# Patient Record
Sex: Female | Born: 1950
Health system: Southern US, Community
[De-identification: ages and names within clinical notes are randomized; demographics above are authoritative.]

## PROBLEM LIST (undated history)

## (undated) DIAGNOSIS — J449 Chronic obstructive pulmonary disease, unspecified: Secondary | ICD-10-CM

## (undated) DIAGNOSIS — T7840XA Allergy, unspecified, initial encounter: Secondary | ICD-10-CM

## (undated) DIAGNOSIS — F419 Anxiety disorder, unspecified: Secondary | ICD-10-CM

## (undated) DIAGNOSIS — E559 Vitamin D deficiency, unspecified: Secondary | ICD-10-CM

## (undated) DIAGNOSIS — M1990 Unspecified osteoarthritis, unspecified site: Secondary | ICD-10-CM

## (undated) DIAGNOSIS — I10 Essential (primary) hypertension: Secondary | ICD-10-CM

## (undated) DIAGNOSIS — E785 Hyperlipidemia, unspecified: Secondary | ICD-10-CM

## (undated) DIAGNOSIS — K219 Gastro-esophageal reflux disease without esophagitis: Secondary | ICD-10-CM

## (undated) DIAGNOSIS — E119 Type 2 diabetes mellitus without complications: Secondary | ICD-10-CM

## (undated) HISTORY — DX: Allergy, unspecified, initial encounter: T78.40XA

## (undated) HISTORY — DX: Vitamin D deficiency, unspecified: E55.9

## (undated) HISTORY — PX: ABDOMINAL HYSTERECTOMY: SHX81

## (undated) HISTORY — DX: Gastro-esophageal reflux disease without esophagitis: K21.9

## (undated) HISTORY — PX: FRACTURE SURGERY: SHX138

## (undated) HISTORY — DX: Unspecified osteoarthritis, unspecified site: M19.90

## (undated) HISTORY — DX: Type 2 diabetes mellitus without complications: E11.9

## (undated) HISTORY — DX: Hereditary hemochromatosis: E83.110

## (undated) HISTORY — PX: COLONOSCOPY: SHX174

## (undated) HISTORY — DX: Anxiety disorder, unspecified: F41.9

## (undated) HISTORY — DX: Hyperlipidemia, unspecified: E78.5

## (undated) HISTORY — PX: LAPAROSCOPIC HYSTERECTOMY: SHX1926

## (undated) HISTORY — DX: Chronic obstructive pulmonary disease, unspecified: J44.9

---

## 1983-10-20 HISTORY — PX: FRACTURE SURGERY: SHX138

## 2006-01-08 ENCOUNTER — Ambulatory Visit: Payer: Self-pay

## 2010-06-12 ENCOUNTER — Encounter: Admission: RE | Admit: 2010-06-12 | Discharge: 2010-06-12 | Payer: Self-pay | Admitting: Family Medicine

## 2010-10-19 HISTORY — PX: GANGLION CYST EXCISION: SHX1691

## 2011-06-04 ENCOUNTER — Emergency Department (HOSPITAL_COMMUNITY)
Admission: EM | Admit: 2011-06-04 | Discharge: 2011-06-04 | Disposition: A | Discharge status: Home / Self Care | Payer: BC Managed Care – PPO | Attending: Emergency Medicine | Admitting: Emergency Medicine

## 2011-06-04 DIAGNOSIS — I10 Essential (primary) hypertension: Secondary | ICD-10-CM | POA: Insufficient documentation

## 2011-06-04 DIAGNOSIS — L02419 Cutaneous abscess of limb, unspecified: Secondary | ICD-10-CM | POA: Insufficient documentation

## 2011-06-04 DIAGNOSIS — L03119 Cellulitis of unspecified part of limb: Secondary | ICD-10-CM | POA: Insufficient documentation

## 2011-06-04 DIAGNOSIS — M25579 Pain in unspecified ankle and joints of unspecified foot: Secondary | ICD-10-CM | POA: Insufficient documentation

## 2011-06-04 LAB — URIC ACID: Uric Acid, Serum: 3.7 mg/dL (ref 2.4–7.0)

## 2011-06-04 LAB — POCT I-STAT, CHEM 8
Chloride: 102 mEq/L (ref 96–112)
Glucose, Bld: 125 mg/dL — ABNORMAL HIGH (ref 70–99)
Hemoglobin: 14.6 g/dL (ref 12.0–15.0)
Potassium: 4.1 mEq/L (ref 3.5–5.1)
Sodium: 138 mEq/L (ref 135–145)
TCO2: 27 mmol/L (ref 0–100)

## 2011-06-04 LAB — DIFFERENTIAL
Basophils Absolute: 0 10*3/uL (ref 0.0–0.1)
Eosinophils Absolute: 0 10*3/uL (ref 0.0–0.7)
Lymphocytes Relative: 7 % — ABNORMAL LOW (ref 12–46)
Neutro Abs: 9.6 10*3/uL — ABNORMAL HIGH (ref 1.7–7.7)
Neutrophils Relative %: 86 % — ABNORMAL HIGH (ref 43–77)

## 2011-06-04 LAB — CBC
HCT: 41.1 % (ref 36.0–46.0)
MCH: 34.2 pg — ABNORMAL HIGH (ref 26.0–34.0)
MCHC: 34.8 g/dL (ref 30.0–36.0)
MCV: 98.3 fL (ref 78.0–100.0)
RBC: 4.18 MIL/uL (ref 3.87–5.11)
RDW: 12.4 % (ref 11.5–15.5)
WBC: 11.2 10*3/uL — ABNORMAL HIGH (ref 4.0–10.5)

## 2011-06-04 LAB — SEDIMENTATION RATE: Sed Rate: 49 mm/hr — ABNORMAL HIGH (ref 0–22)

## 2011-06-06 ENCOUNTER — Inpatient Hospital Stay (HOSPITAL_COMMUNITY): Payer: BC Managed Care – PPO

## 2011-06-06 ENCOUNTER — Inpatient Hospital Stay (HOSPITAL_COMMUNITY)
Admission: AD | Admit: 2011-06-06 | Discharge: 2011-06-08 | DRG: 218 | Disposition: A | Discharge status: Home / Self Care | Payer: BC Managed Care – PPO | Source: Ambulatory Visit | Attending: Orthopedic Surgery | Admitting: Orthopedic Surgery

## 2011-06-06 DIAGNOSIS — M659 Unspecified synovitis and tenosynovitis, unspecified site: Secondary | ICD-10-CM | POA: Diagnosis present

## 2011-06-06 DIAGNOSIS — F1010 Alcohol abuse, uncomplicated: Secondary | ICD-10-CM | POA: Diagnosis present

## 2011-06-06 DIAGNOSIS — F17200 Nicotine dependence, unspecified, uncomplicated: Secondary | ICD-10-CM | POA: Diagnosis present

## 2011-06-06 DIAGNOSIS — M009 Pyogenic arthritis, unspecified: Principal | ICD-10-CM | POA: Diagnosis present

## 2011-06-06 DIAGNOSIS — L02419 Cutaneous abscess of limb, unspecified: Secondary | ICD-10-CM | POA: Diagnosis present

## 2011-06-06 DIAGNOSIS — I10 Essential (primary) hypertension: Secondary | ICD-10-CM | POA: Diagnosis present

## 2011-06-06 LAB — CBC
RDW: 12.4 % (ref 11.5–15.5)
WBC: 6.6 10*3/uL (ref 4.0–10.5)

## 2011-06-06 LAB — SEDIMENTATION RATE: Sed Rate: 101 mm/hr — ABNORMAL HIGH (ref 0–22)

## 2011-06-06 LAB — TYPE AND SCREEN
ABO/RH(D): O POS
Antibody Screen: NEGATIVE

## 2011-06-06 LAB — BASIC METABOLIC PANEL
CO2: 29 mEq/L (ref 19–32)
Calcium: 10.1 mg/dL (ref 8.4–10.5)
Glucose, Bld: 113 mg/dL — ABNORMAL HIGH (ref 70–99)

## 2011-06-06 LAB — GRAM STAIN: Gram Stain: NONE SEEN

## 2011-06-06 LAB — DIFFERENTIAL
Basophils Absolute: 0 10*3/uL (ref 0.0–0.1)
Eosinophils Absolute: 0.1 10*3/uL (ref 0.0–0.7)
Eosinophils Relative: 1 % (ref 0–5)
Lymphs Abs: 0.9 10*3/uL (ref 0.7–4.0)
Monocytes Absolute: 0.6 10*3/uL (ref 0.1–1.0)
Neutrophils Relative %: 75 % (ref 43–77)

## 2011-06-07 LAB — BASIC METABOLIC PANEL
CO2: 28 mEq/L (ref 19–32)
Calcium: 9.3 mg/dL (ref 8.4–10.5)
Chloride: 102 mEq/L (ref 96–112)
Creatinine, Ser: <0.47 mg/dL — ABNORMAL LOW (ref 0.50–1.10)

## 2011-06-10 LAB — WOUND CULTURE

## 2011-06-12 LAB — ANAEROBIC CULTURE

## 2011-06-30 NOTE — Discharge Summary (Signed)
  [name], [name]                  ACCOUNT NO.:  [id number]  MEDICAL RECORD NO.:  [id number]  LOCATION:  1610                         FACILITY:  [facility]  PHYSICIAN:  Almedia Balls. Ranell Patrick, M.D. DATE OF BIRTH:  1951-04-13  DATE OF ADMISSION:  06/06/2011 DATE OF DISCHARGE:  06/08/2011                              DISCHARGE SUMMARY   ADMISSION DIAGNOSIS:  Left ankle infection.  DISCHARGE DIAGNOSIS:  Left ankle infection.  BRIEF HISTORY:  The patient is a 60 year old female who has been diagnosed with a recent cellulitis versus septic ankle to the left side. The patient was admitted for surgical management due to decreasing effectiveness of oral antibiotics with the left ankle pain and swelling  BRIEF HISTORY:  The patient is a 60 year old female who had an aspiration of a cyst of the left ankle about 2 weeks prior to follow up in the office on August 16.  The patient returned back to the office with a swollen red foot, it is quite tender.  The patient was tried on oral antibiotics, which were ineffective.  We did get a stat MRI scan, which was basically inconclusive, thus the patient was admitted for surgical I and D of the left ankle to try and rule out infection and improve outcome.  PROCEDURE:  The patient had a left ankle I and D and placement of a drain by Dr. Malon Kindle on June 06, 2011.  ASSISTANT:  No assistant.  ANESTHESIA:  General anesthesia was used.  COMPLICATIONS:  No complications.  HOSPITAL COURSE:  The patient was admitted on June 06, 2011, for the above-stated procedure, which she tolerated well.  After adequate time in Post Anesthesia Care Unit, she was transferred to 6 East.  Postop day #1, the patient complained of minimal pain in the left ankle.  She was in a splint.  Drain was in place.  She did not have that drain removed until postop day #2.  She had a CAM Walker placed.  Neurovascularly, she was intact.  She had decreased erythema.  She still had  some moderate tenderness.  Neurovascularly, she was intact distally.  Capillary refill was 2 seconds.  She was basically toe-touch weightbearing only to the left lower extremity.  The patient was kept in the hospital for IV antibiotics and once she was deemed appropriate to be discharged to home, she was on oral antibiotics.  The patient's labs within acceptable limits.  No signs of white count or leukocytosis throughout her entire stay.  She was afebrile.  Overall, did quite well.  DISCHARGE/PLAN:  The patient be discharged home on June 10, 2011.  Her condition was stable.  Her diet is regular.  DISCHARGE MEDICATIONS: 1. Robaxin 500 mg p.o. q.6 hours. 2. Percocet 5/325 1-10 tabs q.4-6 hours p.r.n. pain. 3. Clindamycin 600 mg b.i.d. for 10 days.  PLAN:  Follow up with Dr. Malon Kindle in 2 weeks.     Thomas B. Dixon, P.A.   ______________________________ Almedia Balls. Ranell Patrick, M.D.    TBD/MEDQ  D:  06/23/2011  T:  06/23/2011  Job:  [id number]  Electronically Signed by Malon Kindle  on 06/30/2011 03:08:48 AM

## 2011-06-30 NOTE — Op Note (Signed)
[name], [name]                  ACCOUNT NO.:  [id number]  MEDICAL RECORD NO.:  [id number]  LOCATION:  1610                         FACILITY:  [facility]  PHYSICIAN:  Almedia Balls. Ranell Patrick, M.D. DATE OF BIRTH:  10-26-1950  DATE OF PROCEDURE:  06/06/2011 DATE OF DISCHARGE:                              OPERATIVE REPORT   PREOPERATIVE DIAGNOSIS:  Left ankle infection, superficial and deep.  POSTOPERATIVE DIAGNOSIS:  Left ankle infection, superficial and deep.  PROCEDURE PERFORMED:  Left ankle open and arthroscopic I and D with placement of drain in the joint.  SURGEON:  Almedia Balls. Ranell Patrick, M.D.  ASSISTANT:  None.  ANESTHESIA:  General anesthesia was used.  ESTIMATED BLOOD LOSS:  Less than 50 mL.  FLUID REPLACEMENT:  1000 mL crystalloid.  INSTRUMENT COUNT:  Correct.  COMPLICATIONS:  There were no complications.  Preoperative antibiotics were given after cultures were sent.  INDICATIONS:  The patient is a 60 year old female with worsening left ankle pain and swelling despite conservative management.  The patient has had an aspiration of a large amount of fluid from the lateral ankle done earlier this week.  She was seen in the emergency department.  She was treated for either infection or presumptive gout.  Despite both treatment for gout and treatment for infection with clindamycin, the patient's ankle swelling and redness were getting worse.  MRI scan demonstrating loculated fluid, both in the subcu as well as deep, around the ankle joint along the peroneal tendons.  After counseling the patient and her husband regarding treatment options, we have recommended admission to the hospital for IV antibiotics and surgical I and D. Informed consent was obtained.  DESCRIPTION OF PROCEDURE:  After adequate level of anesthesia achieved, the patient positioned supine on operating table.  Left leg was correctly identified.  Nonsterile tourniquet was placed in the left proximal thigh.  The  left leg was then sterilely prepped and draped in usual manner.  We initiated surgery with creation of a small anterolateral portal for the ankle arthroscopy.  Through this portal, we encountered some serous fluid in a pocket that corresponded to the subcutaneous fluid that was noted on MRI scan.  We did go and culture that fluid.  There was quite a bit of seroma present in that fluid and some mucinous material was encountered in there, but no significant cloudy fluid was encountered.  We then entered the ankle joint from the anteromedial portal and did not note any cloudy fluid in the ankle joint.  Then we continued our portal on the anterolateral side after we had lavaged that anterolateral fluid collection.  We then ran 6 liters of normal saline irrigation through the ankle joint.  There was fair amount of synovitis present in the ankle which we removed using a suction shaver.  There was also arthritis beginning, mostly in the anterior tibial plafond.  Just right at the articular margin, there seemed to be an osteophyte there and some early wear and tear from the cartilage.  Otherwise the articular cartilage appeared to be in pretty good shape.  There was synovitis in both the medial gutter and the lateral gutter.  We removed that again with  a suction shaver and inspected to make sure we did not see any signs of deep infection.  Once we had lavaged the ankle joint thoroughly, we extended that anterolateral a little bit more so as about 1.5 cm in length and used a hemostat to bluntly dissecting the soft tissue planes around there, breaking up any loculated fluid that was there and then thoroughly irrigating with normal saline.  We then made a separate longitudinal incision over the peroneal tendons entering the perineal sheath and immediately encountering a fair amount of fluid, not that cloudy, but we did drain that and removed a little bit of tenosynovium from around the peroneals  that appeared to be hypertrophic and inflammatory.  We lavaged that really well with saline.  Once all wounds were thoroughly lavaged, the edema and swelling in the ankle was down.  We went ahead and placed a drain in the joint through the anteromedial portal and then sewed that portal closed and sewed the drain in.  We then sewed the anterolateral incision and the posterolateral incision with just a single-layered skin closure with nylon with horizontal mattress.  We then applied a sterile compressive bandage and a short-leg splint.  The patient was awakened and taken to the recovery room in stable condition.     Almedia Balls. Ranell Patrick, M.D.     SRN/MEDQ  D:  06/06/2011  T:  06/07/2011  Job:  [id number]  Electronically Signed by Malon Kindle  on 06/30/2011 03:08:52 AM

## 2012-02-17 ENCOUNTER — Ambulatory Visit: Payer: BC Managed Care – PPO | Admitting: Sports Medicine

## 2012-03-15 ENCOUNTER — Ambulatory Visit (INDEPENDENT_AMBULATORY_CARE_PROVIDER_SITE_OTHER): Payer: BC Managed Care – PPO | Admitting: Sports Medicine

## 2012-03-15 VITALS — BP 130/70 | Ht 61.0 in | Wt 144.0 lb

## 2012-03-15 DIAGNOSIS — M25572 Pain in left ankle and joints of left foot: Secondary | ICD-10-CM

## 2012-03-15 DIAGNOSIS — G8929 Other chronic pain: Secondary | ICD-10-CM

## 2012-03-15 DIAGNOSIS — M25579 Pain in unspecified ankle and joints of unspecified foot: Secondary | ICD-10-CM

## 2012-03-15 NOTE — Assessment & Plan Note (Addendum)
Issue and ankle pain and swelling likely secondary DJD of the joint. Her infection may have caused further joint breakdown. Plan to have her try ankle compression for the swelling. Will have her try glucosamine/chondroitin and use warm water baths for movement. In 6 weeks if no better, consider ultrasound to evaluate the peroneus brevis that had a tear in her MRI.  Depending on how much she response to the initial treatment we may need to correct some of the leg length difference on the left on her return.  We should also consider scanning her peroneal brevis tendon since on the MRI she had about a year ago she had a partial tear in that tendon.

## 2012-03-15 NOTE — Patient Instructions (Signed)
Please use the compression brace while you are active (standing or walking a lot) and take it off at night and at rest  Try glucosamine/chondroitin supplements  Use warm water baths to work the foot--spell the alphabet a few times in the warm water (10-15 min) once a day  Return in 6 weeks

## 2012-03-15 NOTE — Progress Notes (Signed)
  Subjective:    Patient ID: [name], female    DOB: 05/21/1951, 61 y.o.   MRN: [id number]  HPI  Patient presents today for evaluation of left ankle which is been hurting for about one year. She says that 28 years ago she had a tibial fibula fracture that was repaired with pins. Since this time she has had fairly normal joint function and has not had pain. In August of 2012 she had a ganglion cyst which was drained. This area became infected and she had to have surgery for I&D of the joint. After that she did physical therapy for 8 weeks which did not help with the pain her range of motion. She says it hurts to bear weight and she feels like she is limping. Motrin helps but she does not take it. She has not noticed that ice or elevation helps.  Review of Systems     Objective:   Physical Exam  Vital signs reviewed General appearance - alert, well appearing, and in no distress Ankle- Mild swelling in the left ankle without redness or heat Dorsiflexion of the ankle is limited by 20% plantar flexion of the ankle is limited by 40% Leg length difference of 1.5 cm, shorter on the left Surgical scar noted on left  Gait-able to toe and heel walk Walks with right foot everted with left foot stiff with poor pushoff      Assessment & Plan:

## 2012-03-16 ENCOUNTER — Encounter: Payer: Self-pay | Admitting: Sports Medicine

## 2013-07-01 ENCOUNTER — Emergency Department (HOSPITAL_COMMUNITY): Payer: BC Managed Care – PPO

## 2013-07-01 ENCOUNTER — Encounter (HOSPITAL_COMMUNITY)
Admission: EM | Disposition: A | Discharge status: Home Health | Payer: Self-pay | Source: Home / Self Care | Attending: Orthopedic Surgery

## 2013-07-01 ENCOUNTER — Inpatient Hospital Stay (HOSPITAL_COMMUNITY): Payer: BC Managed Care – PPO

## 2013-07-01 ENCOUNTER — Inpatient Hospital Stay (HOSPITAL_COMMUNITY): Payer: BC Managed Care – PPO | Admitting: Anesthesiology

## 2013-07-01 ENCOUNTER — Inpatient Hospital Stay (HOSPITAL_COMMUNITY)
Admission: EM | Admit: 2013-07-01 | Discharge: 2013-07-04 | DRG: 211 | Disposition: A | Discharge status: Home Health | Payer: BC Managed Care – PPO | Attending: Orthopedic Surgery | Admitting: Orthopedic Surgery

## 2013-07-01 ENCOUNTER — Encounter (HOSPITAL_COMMUNITY): Payer: Self-pay | Admitting: Anesthesiology

## 2013-07-01 DIAGNOSIS — Z7982 Long term (current) use of aspirin: Secondary | ICD-10-CM

## 2013-07-01 DIAGNOSIS — S72031B Displaced midcervical fracture of right femur, initial encounter for open fracture type I or II: Secondary | ICD-10-CM

## 2013-07-01 DIAGNOSIS — I10 Essential (primary) hypertension: Secondary | ICD-10-CM | POA: Diagnosis present

## 2013-07-01 DIAGNOSIS — W108XXA Fall (on) (from) other stairs and steps, initial encounter: Secondary | ICD-10-CM | POA: Diagnosis present

## 2013-07-01 DIAGNOSIS — Z79899 Other long term (current) drug therapy: Secondary | ICD-10-CM

## 2013-07-01 DIAGNOSIS — S72453B Displaced supracondylar fracture without intracondylar extension of lower end of unspecified femur, initial encounter for open fracture type I or II: Principal | ICD-10-CM | POA: Diagnosis present

## 2013-07-01 HISTORY — DX: Essential (primary) hypertension: I10

## 2013-07-01 HISTORY — PX: ORIF FEMUR FRACTURE: SHX2119

## 2013-07-01 HISTORY — PX: I & D EXTREMITY: SHX5045

## 2013-07-01 LAB — CBC
HCT: 43.9 % (ref 36.0–46.0)
Hemoglobin: 15.5 g/dL — ABNORMAL HIGH (ref 12.0–15.0)
MCH: 34.2 pg — ABNORMAL HIGH (ref 26.0–34.0)
MCHC: 35.3 g/dL (ref 30.0–36.0)

## 2013-07-01 LAB — COMPREHENSIVE METABOLIC PANEL
Alkaline Phosphatase: 71 U/L (ref 39–117)
BUN: 13 mg/dL (ref 6–23)
GFR calc Af Amer: >90 mL/min (ref >90)
GFR calc non Af Amer: >90 mL/min (ref >90)
Glucose, Bld: 128 mg/dL — ABNORMAL HIGH (ref 70–99)
Potassium: 3.8 mEq/L (ref 3.5–5.1)
Total Bilirubin: 0.4 mg/dL (ref 0.3–1.2)
Total Protein: 6.8 g/dL (ref 6.0–8.3)

## 2013-07-01 LAB — URINALYSIS, ROUTINE W REFLEX MICROSCOPIC
Leukocytes, UA: NEGATIVE
Nitrite: NEGATIVE
Specific Gravity, Urine: 1.016 (ref 1.005–1.030)
pH: 5 (ref 5.0–8.0)

## 2013-07-01 LAB — PROTIME-INR: Prothrombin Time: 13 seconds (ref 11.6–15.2)

## 2013-07-01 LAB — MRSA PCR SCREENING: MRSA by PCR: NEGATIVE

## 2013-07-01 LAB — TYPE AND SCREEN: ABO/RH(D): O POS

## 2013-07-01 SURGERY — OPEN REDUCTION INTERNAL FIXATION (ORIF) DISTAL FEMUR FRACTURE
Anesthesia: General | Site: Leg Upper | Laterality: Right | Wound class: Dirty or Infected

## 2013-07-01 MED ORDER — HYDROMORPHONE HCL PF 1 MG/ML IJ SOLN
1.0000 mg | Freq: Once | INTRAMUSCULAR | Status: AC
  Administered 2013-07-01: 1 mg via INTRAVENOUS
  Filled 2013-07-01: qty 1

## 2013-07-01 MED ORDER — EPHEDRINE SULFATE 50 MG/ML IJ SOLN
INTRAMUSCULAR | Status: DC | PRN
  Administered 2013-07-01 (×3): 5 mg via INTRAVENOUS

## 2013-07-01 MED ORDER — SERTRALINE HCL 50 MG PO TABS
50.0000 mg | ORAL_TABLET | Freq: Every day | ORAL | Status: DC
  Administered 2013-07-01 – 2013-07-04 (×4): 50 mg via ORAL
  Filled 2013-07-01 (×4): qty 1

## 2013-07-01 MED ORDER — SODIUM CHLORIDE 0.9 % IR SOLN
Status: DC | PRN
  Administered 2013-07-01: 3000 mL

## 2013-07-01 MED ORDER — HYDROMORPHONE HCL PF 1 MG/ML IJ SOLN
0.5000 mg | INTRAMUSCULAR | Status: DC | PRN
  Administered 2013-07-01 (×2): 0.5 mg via INTRAVENOUS

## 2013-07-01 MED ORDER — ONDANSETRON HCL 4 MG/2ML IJ SOLN: 4.0000 mg | Freq: Four times a day (QID) | INTRAMUSCULAR | Status: DC | PRN

## 2013-07-01 MED ORDER — OXYCODONE HCL 5 MG PO TABS: 5.0000 mg | ORAL_TABLET | Freq: Once | ORAL | Status: DC | PRN

## 2013-07-01 MED ORDER — LOSARTAN POTASSIUM 50 MG PO TABS
100.0000 mg | ORAL_TABLET | Freq: Every day | ORAL | Status: DC
Start: 2013-07-01 — End: 2013-07-01

## 2013-07-01 MED ORDER — WARFARIN - PHARMACIST DOSING INPATIENT: Freq: Every day | Status: DC

## 2013-07-01 MED ORDER — HYDROMORPHONE HCL PF 1 MG/ML IJ SOLN
0.2500 mg | INTRAMUSCULAR | Status: DC | PRN
  Administered 2013-07-01 (×5): 0.5 mg via INTRAVENOUS

## 2013-07-01 MED ORDER — HYDROMORPHONE HCL PF 1 MG/ML IJ SOLN
1.0000 mg | INTRAMUSCULAR | Status: DC | PRN
  Administered 2013-07-01: 1 mg via INTRAVENOUS
  Filled 2013-07-01 (×2): qty 1

## 2013-07-01 MED ORDER — WHITE PETROLATUM GEL
Status: AC
  Administered 2013-07-01: 23:00:00
  Filled 2013-07-01: qty 5

## 2013-07-01 MED ORDER — HYDROMORPHONE HCL PF 1 MG/ML IJ SOLN
INTRAMUSCULAR | Status: AC
  Administered 2013-07-01: 0.5 mg via INTRAVENOUS
  Filled 2013-07-01: qty 1

## 2013-07-01 MED ORDER — PROPOFOL 10 MG/ML IV BOLUS
INTRAVENOUS | Status: DC | PRN
  Administered 2013-07-01: 100 mg via INTRAVENOUS
  Administered 2013-07-01: 30 mg via INTRAVENOUS

## 2013-07-01 MED ORDER — SODIUM CHLORIDE 0.9 % IV SOLN
INTRAVENOUS | Status: DC
  Administered 2013-07-01: 08:00:00 via INTRAVENOUS

## 2013-07-01 MED ORDER — PROMETHAZINE HCL 25 MG/ML IJ SOLN: 6.2500 mg | INTRAMUSCULAR | Status: DC | PRN

## 2013-07-01 MED ORDER — AMLODIPINE BESYLATE 10 MG PO TABS
10.0000 mg | ORAL_TABLET | Freq: Every day | ORAL | Status: DC
  Administered 2013-07-01 – 2013-07-04 (×3): 10 mg via ORAL
  Filled 2013-07-01 (×4): qty 1

## 2013-07-01 MED ORDER — WARFARIN SODIUM 5 MG PO TABS
5.0000 mg | ORAL_TABLET | Freq: Once | ORAL | Status: AC
  Administered 2013-07-01: 5 mg via ORAL
  Filled 2013-07-01: qty 1

## 2013-07-01 MED ORDER — OXYCODONE HCL 5 MG/5ML PO SOLN: 5.0000 mg | Freq: Once | ORAL | Status: DC | PRN

## 2013-07-01 MED ORDER — MEPERIDINE HCL 25 MG/ML IJ SOLN: 6.2500 mg | INTRAMUSCULAR | Status: DC | PRN

## 2013-07-01 MED ORDER — CEFAZOLIN SODIUM-DEXTROSE 2-3 GM-% IV SOLR
2.0000 g | INTRAVENOUS | Status: AC
  Administered 2013-07-01: 2 g via INTRAVENOUS
  Filled 2013-07-01 (×2): qty 50

## 2013-07-01 MED ORDER — LIDOCAINE HCL (CARDIAC) 20 MG/ML IV SOLN
INTRAVENOUS | Status: DC | PRN
  Administered 2013-07-01: 40 mg via INTRAVENOUS

## 2013-07-01 MED ORDER — HYDROCODONE-ACETAMINOPHEN 5-325 MG PO TABS
1.0000 | ORAL_TABLET | ORAL | Status: DC | PRN
  Administered 2013-07-01 – 2013-07-02 (×2): 2 via ORAL
  Filled 2013-07-01 (×2): qty 2

## 2013-07-01 MED ORDER — CEFAZOLIN SODIUM 1-5 GM-% IV SOLN
1.0000 g | Freq: Four times a day (QID) | INTRAVENOUS | Status: AC
  Administered 2013-07-01 – 2013-07-02 (×3): 1 g via INTRAVENOUS
  Filled 2013-07-01 (×3): qty 50

## 2013-07-01 MED ORDER — OXYCODONE-ACETAMINOPHEN 5-325 MG PO TABS
1.0000 | ORAL_TABLET | ORAL | Status: DC | PRN
  Administered 2013-07-01 – 2013-07-04 (×11): 2 via ORAL
  Filled 2013-07-01 (×11): qty 2

## 2013-07-01 MED ORDER — HYDROMORPHONE HCL PF 1 MG/ML IJ SOLN
0.5000 mg | INTRAMUSCULAR | Status: DC | PRN
  Administered 2013-07-01 (×3): 1 mg via INTRAVENOUS
  Filled 2013-07-01 (×3): qty 1

## 2013-07-01 MED ORDER — FENTANYL CITRATE 0.05 MG/ML IJ SOLN
INTRAMUSCULAR | Status: DC | PRN
  Administered 2013-07-01: 50 ug via INTRAVENOUS
  Administered 2013-07-01: 100 ug via INTRAVENOUS
  Administered 2013-07-01: 50 ug via INTRAVENOUS
  Administered 2013-07-01: 100 ug via INTRAVENOUS
  Administered 2013-07-01: 50 ug via INTRAVENOUS

## 2013-07-01 MED ORDER — WARFARIN VIDEO
Freq: Once | Status: AC
  Administered 2013-07-01: 15:00:00

## 2013-07-01 MED ORDER — LOSARTAN POTASSIUM 50 MG PO TABS
100.0000 mg | ORAL_TABLET | Freq: Every day | ORAL | Status: DC
  Administered 2013-07-01 – 2013-07-04 (×3): 100 mg via ORAL
  Filled 2013-07-01 (×4): qty 2

## 2013-07-01 MED ORDER — BUPIVACAINE HCL (PF) 0.25 % IJ SOLN
INTRAMUSCULAR | Status: AC
  Filled 2013-07-01: qty 30

## 2013-07-01 MED ORDER — METOCLOPRAMIDE HCL 5 MG/ML IJ SOLN: 5.0000 mg | Freq: Three times a day (TID) | INTRAMUSCULAR | Status: DC | PRN

## 2013-07-01 MED ORDER — ONDANSETRON HCL 4 MG/2ML IJ SOLN
4.0000 mg | Freq: Once | INTRAMUSCULAR | Status: AC
  Administered 2013-07-01: 4 mg via INTRAVENOUS

## 2013-07-01 MED ORDER — 0.9 % SODIUM CHLORIDE (POUR BTL) OPTIME
TOPICAL | Status: DC | PRN
  Administered 2013-07-01: 1000 mL

## 2013-07-01 MED ORDER — ONDANSETRON HCL 4 MG/2ML IJ SOLN
INTRAMUSCULAR | Status: AC
  Filled 2013-07-01: qty 2

## 2013-07-01 MED ORDER — METOCLOPRAMIDE HCL 10 MG PO TABS: 5.0000 mg | ORAL_TABLET | Freq: Three times a day (TID) | ORAL | Status: DC | PRN

## 2013-07-01 MED ORDER — TETANUS-DIPHTHERIA TOXOIDS TD 5-2 LFU IM INJ
0.5000 mL | INJECTION | Freq: Once | INTRAMUSCULAR | Status: AC
  Administered 2013-07-01: 0.5 mL via INTRAMUSCULAR
  Filled 2013-07-01: qty 0.5

## 2013-07-01 MED ORDER — SUCCINYLCHOLINE CHLORIDE 20 MG/ML IJ SOLN
INTRAMUSCULAR | Status: DC | PRN
  Administered 2013-07-01: 100 mg via INTRAVENOUS

## 2013-07-01 MED ORDER — MORPHINE SULFATE 2 MG/ML IJ SOLN
2.0000 mg | INTRAMUSCULAR | Status: DC | PRN
  Administered 2013-07-01 – 2013-07-02 (×3): 2 mg via INTRAVENOUS
  Filled 2013-07-01 (×3): qty 1

## 2013-07-01 MED ORDER — MIDAZOLAM HCL 2 MG/2ML IJ SOLN: 0.5000 mg | Freq: Once | INTRAMUSCULAR | Status: DC | PRN

## 2013-07-01 MED ORDER — COUMADIN BOOK
Freq: Once | Status: AC
  Administered 2013-07-01: 15:00:00
  Filled 2013-07-01: qty 1

## 2013-07-01 MED ORDER — MIDAZOLAM HCL 5 MG/5ML IJ SOLN
INTRAMUSCULAR | Status: DC | PRN
  Administered 2013-07-01 (×2): 1 mg via INTRAVENOUS

## 2013-07-01 MED ORDER — SODIUM CHLORIDE 0.9 % IV SOLN
1.5000 g | Freq: Once | INTRAVENOUS | Status: AC
  Administered 2013-07-01: 1.5 g via INTRAVENOUS
  Filled 2013-07-01: qty 1.5

## 2013-07-01 MED ORDER — ONDANSETRON HCL 4 MG PO TABS: 4.0000 mg | ORAL_TABLET | Freq: Four times a day (QID) | ORAL | Status: DC | PRN

## 2013-07-01 MED ORDER — ONDANSETRON HCL 4 MG/2ML IJ SOLN
INTRAMUSCULAR | Status: DC | PRN
  Administered 2013-07-01: 4 mg via INTRAVENOUS

## 2013-07-01 MED ORDER — LACTATED RINGERS IV SOLN
INTRAVENOUS | Status: DC | PRN
  Administered 2013-07-01 (×2): via INTRAVENOUS

## 2013-07-01 MED ORDER — DIPHENHYDRAMINE HCL 25 MG PO CAPS
25.0000 mg | ORAL_CAPSULE | Freq: Four times a day (QID) | ORAL | Status: DC | PRN
Start: 2013-07-01 — End: 2013-07-04
  Administered 2013-07-01 – 2013-07-02 (×3): 25 mg via ORAL
  Filled 2013-07-01 (×3): qty 1

## 2013-07-01 MED ORDER — ALBUTEROL SULFATE HFA 108 (90 BASE) MCG/ACT IN AERS
INHALATION_SPRAY | RESPIRATORY_TRACT | Status: DC | PRN
  Administered 2013-07-01: 2 via RESPIRATORY_TRACT

## 2013-07-01 MED ORDER — ARTIFICIAL TEARS OP OINT
TOPICAL_OINTMENT | OPHTHALMIC | Status: DC | PRN
  Administered 2013-07-01: 1 via OPHTHALMIC

## 2013-07-01 SURGICAL SUPPLY — 62 items
BANDAGE GAUZE ELAST BULKY 4 IN (GAUZE/BANDAGES/DRESSINGS) ×1 IMPLANT
BIT DRILL 3.2 CALIBRATED (BIT) ×1
BIT DRILL 3.2MM CALIBRATED (BIT) IMPLANT
BIT DRILL 3.8 CALIBRATED (BIT) ×1
BIT DRILL 3.8MM CALIBRATED (BIT) IMPLANT
BLADE SURG 10 STRL SS (BLADE) ×2 IMPLANT
BNDG COHESIVE 6X5 TAN STRL LF (GAUZE/BANDAGES/DRESSINGS) ×3 IMPLANT
CLEANER TIP ELECTROSURG 2X2 (MISCELLANEOUS) ×1 IMPLANT
CLOTH BEACON ORANGE TIMEOUT ST (SAFETY) ×2 IMPLANT
COVER MAYO STAND STRL (DRAPES) ×2 IMPLANT
COVER SURGICAL LIGHT HANDLE (MISCELLANEOUS) ×2 IMPLANT
DRAPE C-ARM 42X72 X-RAY (DRAPES) ×1 IMPLANT
DRAPE INCISE IOBAN 66X45 STRL (DRAPES) ×2 IMPLANT
DRAPE U-SHAPE 47X51 STRL (DRAPES) ×2 IMPLANT
DRILL BIT 3.2MM CALIBRATED (BIT) ×2
DRILL BIT 3.8MM CALIBRATED (BIT) ×2
DRSG ADAPTIC 3X8 NADH LF (GAUZE/BANDAGES/DRESSINGS) ×1 IMPLANT
DRSG PAD ABDOMINAL 8X10 ST (GAUZE/BANDAGES/DRESSINGS) ×1 IMPLANT
DURAPREP 26ML APPLICATOR (WOUND CARE) ×2 IMPLANT
ELECT REM PT RETURN 9FT ADLT (ELECTROSURGICAL) ×2
ELECTRODE REM PT RTRN 9FT ADLT (ELECTROSURGICAL) ×1 IMPLANT
EVACUATOR 1/8 PVC DRAIN (DRAIN) IMPLANT
GLOVE BIOGEL PI IND STRL 9 (GLOVE) ×1 IMPLANT
GLOVE BIOGEL PI INDICATOR 9 (GLOVE) ×1
GLOVE SURG ORTHO 9.0 STRL STRW (GLOVE) ×2 IMPLANT
GOWN PREVENTION PLUS XLARGE (GOWN DISPOSABLE) ×2 IMPLANT
GOWN SRG XL XLNG 56XLVL 4 (GOWN DISPOSABLE) ×2 IMPLANT
GOWN STRL NON-REIN XL XLG LVL4 (GOWN DISPOSABLE) ×2
GUIDEPIN 3.2  ENDO CALB STRL (PIN) ×1
GUIDEPIN 3.2 ENDO CALB STRL (PIN) IMPLANT
HANDPIECE INTERPULSE COAX TIP (DISPOSABLE) ×2
KIT BASIN OR (CUSTOM PROCEDURE TRAY) ×2 IMPLANT
KIT ROOM TURNOVER OR (KITS) ×2 IMPLANT
MANIFOLD NEPTUNE II (INSTRUMENTS) ×2 IMPLANT
NEEDLE 22X1 1/2 (OR ONLY) (NEEDLE) ×1 IMPLANT
NS IRRIG 1000ML POUR BTL (IV SOLUTION) ×2 IMPLANT
PACK ORTHO EXTREMITY (CUSTOM PROCEDURE TRAY) ×2 IMPLANT
PAD ARMBOARD 7.5X6 YLW CONV (MISCELLANEOUS) ×4 IMPLANT
PLATE FEMORAL POLY HOLD RT 9 (Plate) ×1 IMPLANT
SCREW LOCK PLY DIST FEM 4.5X30 (Screw) ×3 IMPLANT
SCREW LOCK PLY PROX TIB 8X70 (Screw) ×1 IMPLANT
SCREW NLOCK CORT 4.5X34 (Screw) ×3 IMPLANT
SCREW NLOCK DIST FEM 5.5X70 (Screw) ×3 IMPLANT
SCREW NLOCK DIST FEM 5.5X75 (Screw) ×1 IMPLANT
SET HNDPC FAN SPRY TIP SCT (DISPOSABLE) IMPLANT
SPONGE GAUZE 4X4 12PLY (GAUZE/BANDAGES/DRESSINGS) ×1 IMPLANT
SPONGE LAP 18X18 X RAY DECT (DISPOSABLE) ×2 IMPLANT
STAPLER VISISTAT 35W (STAPLE) ×1 IMPLANT
STOCKINETTE IMPERVIOUS LG (DRAPES) ×2 IMPLANT
SUCTION FRAZIER TIP 10 FR DISP (SUCTIONS) ×1 IMPLANT
SUT ETHILON 2 0 PSLX (SUTURE) ×1 IMPLANT
SUT ETHILON 4 0 PS 2 18 (SUTURE) IMPLANT
SUT VIC AB 0 CTB1 27 (SUTURE) ×2 IMPLANT
SUT VIC AB 1 CT1 27 (SUTURE) ×2
SUT VIC AB 1 CT1 27XBRD ANBCTR (SUTURE) IMPLANT
SUT VIC AB 1 CTB1 27 (SUTURE) ×2 IMPLANT
SUT VIC AB 2-0 CTB1 (SUTURE) ×2 IMPLANT
SYR 20ML ECCENTRIC (SYRINGE) ×1 IMPLANT
TOWEL OR 17X24 6PK STRL BLUE (TOWEL DISPOSABLE) ×2 IMPLANT
TOWEL OR 17X26 10 PK STRL BLUE (TOWEL DISPOSABLE) ×2 IMPLANT
TUBE CONNECTING 12X1/4 (SUCTIONS) ×2 IMPLANT
YANKAUER SUCT BULB TIP NO VENT (SUCTIONS) ×2 IMPLANT

## 2013-07-01 NOTE — Progress Notes (Signed)
Requested report from Wynelle Beckmann, RN, ED nurse at [phone]. Spoke with Wynelle Beckmann, RN, and received report at [phone]. Per ED nurse, Dr. Lajoyce Corners had been "paged to see the patient". Patient arrived to floor in 10/10 pain and Dilaudid 1mg  was given to patient at 0602. Paged Dr. Lajoyce Corners at [phone] and again at 0625. Husband at the bedside. Nursing will continue to monitor.

## 2013-07-01 NOTE — Anesthesia Procedure Notes (Signed)
Procedure Name: Intubation Date/Time: 07/01/2013 10:01 AM Performed by: Gayla Medicus Pre-anesthesia Checklist: Patient identified, Timeout performed, Emergency Drugs available, Suction available and Patient being monitored Patient Re-evaluated:Patient Re-evaluated prior to inductionOxygen Delivery Method: Circle system utilized Preoxygenation: Pre-oxygenation with 100% oxygen Intubation Type: IV induction, Rapid sequence and Cricoid Pressure applied Laryngoscope Size: Mac and 3 Grade View: Grade I Tube type: Oral Tube size: 7.5 mm Number of attempts: 1 Airway Equipment and Method: Stylet Placement Confirmation: ETT inserted through vocal cords under direct vision,  positive ETCO2 and breath sounds checked- equal and bilateral Secured at: 22 cm Tube secured with: Tape Dental Injury: Teeth and Oropharynx as per pre-operative assessment

## 2013-07-01 NOTE — Anesthesia Preprocedure Evaluation (Addendum)
Anesthesia Evaluation  Patient identified by MRN, date of birth, ID band Patient awake    Reviewed: Allergy & Precautions, H&P , NPO status , Patient's Chart, lab work & pertinent test results  History of Anesthesia Complications Negative for: history of anesthetic complications  Airway Mallampati: II TM Distance: >3 FB Neck ROM: Full    Dental  (+) Teeth Intact and Dental Advisory Given   Pulmonary COPD COPD inhaler, Current Smoker,  breath sounds clear to auscultation  Pulmonary exam normal       Cardiovascular hypertension, Pt. on medications Rhythm:Regular Rate:Normal     Neuro/Psych negative neurological ROS     GI/Hepatic negative GI ROS, Elevated LFTs Daily alcohol consumption     Endo/Other  negative endocrine ROS  Renal/GU negative Renal ROS     Musculoskeletal   Abdominal   Peds  Hematology   Anesthesia Other Findings   Reproductive/Obstetrics                        Anesthesia Physical Anesthesia Plan  ASA: III  Anesthesia Plan: General   Post-op Pain Management:    Induction: Intravenous  Airway Management Planned: Oral ETT  Additional Equipment:   Intra-op Plan:   Post-operative Plan: Extubation in OR  Informed Consent: I have reviewed the patients History and Physical, chart, labs and discussed the procedure including the risks, benefits and alternatives for the proposed anesthesia with the patient or authorized representative who has indicated his/her understanding and acceptance.   Dental advisory given  Plan Discussed with: CRNA, Anesthesiologist and Surgeon  Anesthesia Plan Comments: (Plan routine monitors, GETA)       Anesthesia Quick Evaluation

## 2013-07-01 NOTE — Anesthesia Postprocedure Evaluation (Signed)
  Anesthesia Post-op Note  Patient: [name]  Procedure(s) Performed: Procedure(s): OPEN REDUCTION INTERNAL FIXATION (ORIF) DISTAL FEMUR FRACTURE (Right) IRRIGATION AND DEBRIDEMENT RIGHT LEG (Right)  Patient Location: PACU  Anesthesia Type:General  Level of Consciousness: awake, alert , oriented and patient cooperative  Airway and Oxygen Therapy: Patient Spontanous Breathing and Patient connected to nasal cannula oxygen  Post-op Pain: mild  Post-op Assessment: Post-op Vital signs reviewed, Patient's Cardiovascular Status Stable, Respiratory Function Stable, Patent Airway, No signs of Nausea or vomiting and Pain level controlled  Post-op Vital Signs: Reviewed and stable  Complications: No apparent anesthesia complications

## 2013-07-01 NOTE — Progress Notes (Signed)
Dr. Lajoyce Corners at bedside speaking with patient. Nursing will continue to monitor.

## 2013-07-01 NOTE — H&P (Signed)
[name] is an 62 y.o. female.   Chief Complaint: Right knee supracondylar femur fracture HPI:  Patient is a 61-year-o carrying boxes when she sustained the supracondylar humerus fracture right kneeld woman who fell going up the stairs  No past medical history on file.  No past surgical history on file.  No family history on file. Social History:  has no tobacco, alcohol, and drug history on file.  Allergies:  Allergies  Allergen Reactions  . Codeine Nausea Only    Medications Prior to Admission  Medication Sig Dispense Refill  . amLODipine (NORVASC) 10 MG tablet Take 10 mg by mouth daily.      Marland Kitchen losartan (COZAAR) 100 MG tablet Take 100 mg by mouth daily.      . sertraline (ZOLOFT) 50 MG tablet Take 50 mg by mouth daily.        Results for orders placed during the hospital encounter of 07/01/13 (from the past 48 hour(s))  CBC     Status: Abnormal   Collection Time    07/01/13  1:03 AM      Result Value Range   WBC 7.2  4.0 - 10.5 K/uL   RBC 4.53  3.87 - 5.11 MIL/uL   Hemoglobin 15.5 (*) 12.0 - 15.0 g/dL   HCT 40.9  81.1 - 91.4 %   MCV 96.9  78.0 - 100.0 fL   MCH 34.2 (*) 26.0 - 34.0 pg   MCHC 35.3  30.0 - 36.0 g/dL   RDW 78.2  95.6 - 21.3 %   Platelets 146 (*) 150 - 400 K/uL  COMPREHENSIVE METABOLIC PANEL     Status: Abnormal   Collection Time    07/01/13  1:03 AM      Result Value Range   Sodium 143  135 - 145 mEq/L   Potassium 3.8  3.5 - 5.1 mEq/L   Chloride 103  96 - 112 mEq/L   CO2 26  19 - 32 mEq/L   Glucose, Bld 128 (*) 70 - 99 mg/dL   BUN 13  6 - 23 mg/dL   Creatinine, Ser 0.86  0.50 - 1.10 mg/dL   Calcium 8.7  8.4 - 57.8 mg/dL   Total Protein 6.8  6.0 - 8.3 g/dL   Albumin 4.0  3.5 - 5.2 g/dL   AST 47 (*) 0 - 37 U/L   ALT 59 (*) 0 - 35 U/L   Alkaline Phosphatase 71  39 - 117 U/L   Total Bilirubin 0.4  0.3 - 1.2 mg/dL   GFR calc non Af Amer >90  >90 mL/min   GFR calc Af Amer >90  >90 mL/min   Comment: (NOTE)     The eGFR has been calculated using  the CKD EPI equation.     This calculation has not been validated in all clinical situations.     eGFR's persistently <90 mL/min signify possible Chronic Kidney     Disease.  TYPE AND SCREEN     Status: None   Collection Time    07/01/13  1:05 AM      Result Value Range   ABO/RH(D) O POS     Antibody Screen NEG     Sample Expiration 07/04/2013    PROTIME-INR     Status: None   Collection Time    07/01/13  1:05 AM      Result Value Range   Prothrombin Time 13.0  11.6 - 15.2 seconds   INR 1.00  0.00 - 1.49  ABO/RH     Status: None   Collection Time    07/01/13  1:05 AM      Result Value Range   ABO/RH(D) O POS     Dg Chest 1 View  07/01/2013   *RADIOLOGY REPORT*  Clinical Data: Surgery  CHEST - 1 VIEW  Comparison: Prior radiograph from 06/06/2011  Findings: The cardiac and mediastinal silhouettes are stable in size and contour, and remain within normal limits.  Calcified mediastinal adenopathy is again noted, unchanged.  The lungs are normally inflated.  No focal infiltrate to suggest an acute infectious pneumonitis or aspiration identified.  There is no pulmonary edema or pleural effusion.  No pneumothorax.  No acute osseous abnormality identified.  IMPRESSION: No acute cardiopulmonary process.   Original Report Authenticated By: Rise Mu, M.D.   Dg Pelvis 1-2 Views  07/01/2013   CLINICAL DATA:  Trauma with right femur pain.  EXAM: PELVIS - 1-2 VIEW  COMPARISON:  None.  FINDINGS: There is no evidence of pelvic fracture or diastasis. No other pelvic bone lesions are seen.  IMPRESSION: Negative   Electronically Signed   By: Tiburcio Pea   On: 07/01/2013 02:43   Dg Femur Right  07/01/2013   *RADIOLOGY REPORT*  Clinical Data: Trauma  RIGHT FEMUR - 2 VIEW  Comparison: None.  Findings: There is an acute comminuted fracture of the distal right femoral shaft with approximately 2.5 cm of posterior displacement. There is irregularity of the overlying soft tissues of the distal right  anterior thigh, consistent with an open fracture. Few scattered foci of soft tissue emphysema are present.  No definite radiopaque foreign body.  The right hip remains approximated.  IMPRESSION: Comminuted open fracture of the distal right femoral shaft with associated posterior displacement.   Original Report Authenticated By: Rise Mu, M.D.   Dg Knee 2 Views Right  07/01/2013   *RADIOLOGY REPORT*  Clinical Data: Trauma  RIGHT KNEE - 1-2 VIEW  Comparison: Concomitant radiograph the right femur  Findings: There is an extensively comminuted fracture of the distal right femoral shaft with approximately 2.5 cm of posterior displacement. There is an overlying soft tissue laceration with the distal aspect of the right femur extending to the skin, consistent with an open fracture.  There is associated soft tissue emphysema.  The right knee appears grossly approximated on the lateral projection.  Evaluation on the frontal projection is limited due to patient positioning.  The proximal tibia and fibula are intact. Patella is intact as well.  No radiopaque foreign body.  IMPRESSION:  Comminuted open fracture of the distal right femoral shaft.  No other acute fracture about the knee identified.   Original Report Authenticated By: Rise Mu, M.D.   Dg Tibia/fibula Right  07/01/2013   *RADIOLOGY REPORT*  Clinical Data: Trauma  RIGHT TIBIA AND FIBULA - 2 VIEW  Comparison: Concomitant radiograph of the right femur and right knee  Findings: Again seen is a comminuted open fracture of the distal right femoral shaft, better evaluated on concomitant radiograph of the right femur and right knee.  No acute fracture or dislocation is seen within the right tibia and fibula.  The right ankle and the are grossly approximated.  No soft tissue abnormality.  No radiopaque foreign body.  IMPRESSION: 1.  No acute fracture or dislocation of the right tibia and fibula. 2. Comminuted open fracture of the distal right  femoral shaft, better evaluated on concomitant radiograph of the right femur and right knee.   Original  Report Authenticated By: Rise Mu, M.D.    Review of Systems  All other systems reviewed and are negative.    Blood pressure 100/57, pulse 79, temperature 98.2 F (36.8 C), temperature source Oral, resp. rate 20, SpO2 97.00%. Physical Exam   on examination patient's right lower extremity is neurovascularly intact. She is an open laceration from the bone fracture. This was initially irrigated and debrided in the emergency room. Radiographs shows the displaced comminuted supracondylar metaphyseal fracture. Assessment/Plan  Assessment: Comminuted open supracondylar humerus fracture right knee.  Plan: Will plan for open reduction and total fixation with repeat irrigation and debridement. Risks and benefits of surgery were discussed including infection neurovascular injury pain arthritis failure of internal fixation risk for DVT. Patient states she understands was to proceed at this time.   Jossiah Smoak V 07/01/2013, 6:46 AM

## 2013-07-01 NOTE — Op Note (Signed)
OPERATIVE REPORT  DATE OF SURGERY: 07/01/2013  PATIENT:  Lovie Chol,  62 y.o. female  PRE-OPERATIVE DIAGNOSIS:  open right distal femur fracture interarticular  POST-OPERATIVE DIAGNOSIS:  open right distal femur fracture intra-articular  PROCEDURE:  Procedure(s): OPEN REDUCTION INTERNAL FIXATION (ORIF) DISTAL FEMUR FRACTURE Biomet locking lateral plate with a 9 hole plate. Locked distally compression screws proximally IRRIGATION AND DEBRIDEMENT RIGHT LEG Excision bone, muscle and fascia.  SURGEON:  Surgeon(s): Nadara Mustard, MD  ANESTHESIA:   general  EBL:  imn ML  SPECIMEN:  No Specimen  TOURNIQUET:  * No tourniquets in log *  PROCEDURE DETAILS: Patient is a 62 year old woman who states that last night she was carrying some boxes from the attic when she fell sustaining the open type I [place] right distal radius fracture with intra-articular component. Patient was evaluated emergency room under went initial irrigation debridement with saline and Betadine a sterile dressing was applied she was immobilized and presents this morning for revision irrigation and debridement and internal fixation. Patient has been on IV Unasyn. Risks and benefits of surgery were discussed including infection neurovascular injury arthritis nonhealing of the bone need for additional surgery. Patient states she understands and wished to proceed at this time. Description of procedure patient was brought to the operating room underwent a general anesthetic. After adequate levels and anesthesia were obtained patient's right lower extremity was prepped using DuraPrep draped into a sterile field. A lateral incision was made and carried down to the tensor fascia lata which was split. The the quadratus lateral muscle was elevated superiorly. The bone which had penetrated the skin which is approximately 1 cm in length was excised. Approximately 1 cm of bone was resected. The traumatic wound and the surgical  wounds were both irrigated with pulsatile lavage 3 L. The wound was clean there is no signs of any contamination. Necrotic muscle was excised as well as fascia and soft tissue. The plate was then sized for a 9 hole plate this was secured distally the flexion and alignment was restored and a proximal compression screw was placed percutaneously. The compression screws were then placed distally with one central screw distally. Proximally at the fracture site 3 locking screws were placed with one proximal compression screw with compression screws distally. Patient had 8 cortices of compression proximally and 4 screws distally. The wounds were again irrigated with pulsatile lavage. The tensor fascia was closed using #1 Vicryl the skin was closed using staples. The traumatic wound was closed using nylon. The wounds were covered with Adaptic orthopedic sponges AB dressing Kerlix and Coban. Patient was placed in knee immobilizer. Prior to placing the knee immobilizer patient had full extension and full flexion of her knee to 130. Patient was extubated taken to the PACU in stable condition.  PLAN OF CARE: Admit to inpatient   PATIENT DISPOSITION:  PACU - hemodynamically stable.   Nadara Mustard, MD 07/01/2013 11:27 AM

## 2013-07-01 NOTE — Preoperative (Signed)
Beta Blockers   Reason not to administer Beta Blockers:Not Applicable 

## 2013-07-01 NOTE — Transfer of Care (Signed)
Immediate Anesthesia Transfer of Care Note  Patient: [name]  Procedure(s) Performed: Procedure(s): OPEN REDUCTION INTERNAL FIXATION (ORIF) DISTAL FEMUR FRACTURE (Right) IRRIGATION AND DEBRIDEMENT RIGHT LEG (Right)  Patient Location: PACU  Anesthesia Type:General  Level of Consciousness: awake, alert  and oriented  Airway & Oxygen Therapy: Patient Spontanous Breathing and Patient connected to nasal cannula oxygen  Post-op Assessment: Report given to PACU RN and Post -op Vital signs reviewed and stable  Post vital signs: Reviewed and stable  Complications: No apparent anesthesia complications

## 2013-07-01 NOTE — Progress Notes (Signed)
ANTICOAGULATION CONSULT NOTE - Initial Consult  Pharmacy Consult for warfarin Indication: VTE prophylaxis  Allergies  Allergen Reactions  . Codeine Nausea Only    Patient Measurements:     Vital Signs: Temp: 98.6 F (37 C) (09/13 1345) Temp src: Oral (09/13 1345) BP: 111/61 mmHg (09/13 1345) Pulse Rate: 83 (09/13 1345)  Labs:  Recent Labs  07/01/13 0103 07/01/13 0105  HGB 15.5*  --   HCT 43.9  --   PLT 146*  --   LABPROT  --  13.0  INR  --  1.00  CREATININE 0.57  --     The CrCl is unknown because both a height and weight (above a minimum accepted value) are required for this calculation.   Medical History: No past medical history on file.  Medications:  Scheduled:  . amLODipine  10 mg Oral Daily  .  ceFAZolin (ANCEF) IV  1 g Intravenous Q6H  . losartan  100 mg Oral Daily  . ondansetron      . sertraline  50 mg Oral Daily    Assessment: 62 yo caucasian F s/p ORIF distal femur fracture from falling while going up stairs. Pharmacy consulted to dose warfarin for VTE prophylaxis. No anticoagulation PTA per records. Pt weight: 134lb (per pt) Coumadin score = 2 Baseline INR: 1 Hgb 15.5, platelets 146  Goal of Therapy:  INR 2-3 Monitor platelets by anticoagulation protocol: Yes   Plan:  -Coumadin 5 mg x 1 per nomogram based on coumadin score -daily INR -f/u H/H, bleeding complications -coumadin book and video ordered.  -f/u education  Jostin Rue C. Aram Domzalski, PharmD Clinical Pharmacist-Resident Pager: [phone] Pharmacy: [phone] 07/01/2013 2:27 PM

## 2013-07-01 NOTE — ED Notes (Signed)
Patient transported to X-ray 

## 2013-07-01 NOTE — Progress Notes (Signed)
Patient requesting something for pain. Notified Clydie Braun, CRNA

## 2013-07-01 NOTE — ED Notes (Signed)
Per [facility] called for fall. Walking down from attic carrying box and fell. Pt has open fx above rt knee. 250mg  Fentanyl given en route and 4 Zofran. + PMS.

## 2013-07-01 NOTE — ED Notes (Signed)
Pt. Nauseated from pain meds.

## 2013-07-01 NOTE — ED Provider Notes (Signed)
CSN: [id number]     Arrival date & time 07/01/13  0017 History   First MD Initiated Contact with Patient 07/01/13 [phone]     Chief Complaint  Patient presents with  .  possible femur fx    (Consider location/radiation/quality/duration/timing/severity/associated sxs/prior Treatment) HPI Comments: 62 yo wf with injury to R femur at 2230.  EMS called by husband.  Concern for open fracture.  Mechanism: pt was walking up attic stairs carrying a box and fell.  EMS reports gross deformity and 3cm laceration proximal to R patella.  Isolated injury.  Pt denies HA, neck pain, CP, SOB, abd pain, n/v/d, other extremity pain o/t R femur.   Allergies; NKDA M:Norvasc, Cozaar, Zoloft Last meal: [employer] PMH: HTN PSH: L ankle, TAH   Patient is a 62 y.o. female presenting with extremity pain. The history is provided by the patient and the EMS personnel.  Extremity Pain This is a new problem. The current episode started 1 to 2 hours ago. The problem occurs constantly. Pertinent negatives include no chest pain, no abdominal pain, no headaches and no shortness of breath. The symptoms are relieved by medications. She has tried nothing for the symptoms. The treatment provided no relief.   Pt fell while walking down attic stairs.  EMS gave fentanyl .     No past medical history on file. No past surgical history on file. No family history on file. History  Substance Use Topics  . Smoking status: Not on file  . Smokeless tobacco: Not on file  . Alcohol Use: Not on file   OB History   No data available     Review of Systems  Constitutional: Negative.   HENT: Negative.   Eyes: Negative.   Respiratory: Negative.  Negative for shortness of breath.   Cardiovascular: Negative for chest pain.  Gastrointestinal: Negative.  Negative for abdominal pain.  Endocrine: Negative.   Genitourinary: Negative.   Musculoskeletal: Positive for myalgias, arthralgias and gait problem. Negative for back pain and  joint swelling.  Skin: Positive for wound.  Neurological: Negative for dizziness, facial asymmetry, light-headedness, numbness and headaches.    Allergies  Codeine  Home Medications   No current outpatient prescriptions on file. BP 123/74  Pulse 71  Temp(Src) 98.2 F (36.8 C) (Oral)  Resp 16  SpO2 96% Physical Exam  Constitutional: She is oriented to person, place, and time. She appears well-developed and well-nourished.  HENT:  Head: Normocephalic and atraumatic.  Eyes: Conjunctivae and EOM are normal. Pupils are equal, round, and reactive to light.  Neck: Normal range of motion. Neck supple. No JVD present. No tracheal deviation present.  FAROM, no s/o, no deformity,   Cardiovascular: Normal rate and regular rhythm.   Pulmonary/Chest: Effort normal and breath sounds normal. No stridor.  Abdominal: Soft. Bowel sounds are normal. She exhibits no distension and no mass. There is no tenderness. There is no rebound and no guarding.  Musculoskeletal: She exhibits edema and tenderness.       Right knee: She exhibits swelling, ecchymosis, laceration and bony tenderness. She exhibits no effusion. Tenderness found. Patellar tendon tenderness noted.       Legs: Gross deformity to R thigh; edema to R thigh; 3 cm linear laceration to distal R thigh proximal to patella - no active bleeding, ROM to R knee limited by pain; thigh and calf compartments soft; distal n/v/m function intact; DP and AT pulses strong and symmetric.  Pelvis stable; negative pelvic rock Hip: ROM limited by pain  in femur Ankle/Foot - FAROM, no ttp, distal n/v/m function intact  Lymphadenopathy:    She has no cervical adenopathy.  Neurological: She is alert and oriented to person, place, and time. She has normal reflexes.  Sensation, reflexes, motor strength intact distal to fracture    ED Course  Procedures (including critical care time) Labs Review Labs Reviewed  CBC - Abnormal; Notable for the following:     Hemoglobin 15.5 (*)    MCH 34.2 (*)    Platelets 146 (*)    All other components within normal limits  COMPREHENSIVE METABOLIC PANEL - Abnormal; Notable for the following:    Glucose, Bld 128 (*)    AST 47 (*)    ALT 59 (*)    All other components within normal limits  URINE CULTURE  MRSA PCR SCREENING  PROTIME-INR  URINALYSIS, ROUTINE W REFLEX MICROSCOPIC  TYPE AND SCREEN  ABO/RH   Imaging Review Dg Chest 1 View  07/01/2013   *RADIOLOGY REPORT*  Clinical Data: Surgery  CHEST - 1 VIEW  Comparison: Prior radiograph from 06/06/2011  Findings: The cardiac and mediastinal silhouettes are stable in size and contour, and remain within normal limits.  Calcified mediastinal adenopathy is again noted, unchanged.  The lungs are normally inflated.  No focal infiltrate to suggest an acute infectious pneumonitis or aspiration identified.  There is no pulmonary edema or pleural effusion.  No pneumothorax.  No acute osseous abnormality identified.  IMPRESSION: No acute cardiopulmonary process.   Original Report Authenticated By: Rise Mu, M.D.   Dg Pelvis 1-2 Views  07/01/2013   CLINICAL DATA:  Trauma with right femur pain.  EXAM: PELVIS - 1-2 VIEW  COMPARISON:  None.  FINDINGS: There is no evidence of pelvic fracture or diastasis. No other pelvic bone lesions are seen.  IMPRESSION: Negative   Electronically Signed   By: Tiburcio Pea   On: 07/01/2013 02:43   Dg Femur Right  07/01/2013   *RADIOLOGY REPORT*  Clinical Data: Trauma  RIGHT FEMUR - 2 VIEW  Comparison: None.  Findings: There is an acute comminuted fracture of the distal right femoral shaft with approximately 2.5 cm of posterior displacement. There is irregularity of the overlying soft tissues of the distal right anterior thigh, consistent with an open fracture. Few scattered foci of soft tissue emphysema are present.  No definite radiopaque foreign body.  The right hip remains approximated.  IMPRESSION: Comminuted open fracture of  the distal right femoral shaft with associated posterior displacement.   Original Report Authenticated By: Rise Mu, M.D.   Dg Knee 2 Views Right  07/01/2013   *RADIOLOGY REPORT*  Clinical Data: Trauma  RIGHT KNEE - 1-2 VIEW  Comparison: Concomitant radiograph the right femur  Findings: There is an extensively comminuted fracture of the distal right femoral shaft with approximately 2.5 cm of posterior displacement. There is an overlying soft tissue laceration with the distal aspect of the right femur extending to the skin, consistent with an open fracture.  There is associated soft tissue emphysema.  The right knee appears grossly approximated on the lateral projection.  Evaluation on the frontal projection is limited due to patient positioning.  The proximal tibia and fibula are intact. Patella is intact as well.  No radiopaque foreign body.  IMPRESSION:  Comminuted open fracture of the distal right femoral shaft.  No other acute fracture about the knee identified.   Original Report Authenticated By: Rise Mu, M.D.   Dg Tibia/fibula Right  07/01/2013   *RADIOLOGY  REPORT*  Clinical Data: Trauma  RIGHT TIBIA AND FIBULA - 2 VIEW  Comparison: Concomitant radiograph of the right femur and right knee  Findings: Again seen is a comminuted open fracture of the distal right femoral shaft, better evaluated on concomitant radiograph of the right femur and right knee.  No acute fracture or dislocation is seen within the right tibia and fibula.  The right ankle and the are grossly approximated.  No soft tissue abnormality.  No radiopaque foreign body.  IMPRESSION: 1.  No acute fracture or dislocation of the right tibia and fibula. 2. Comminuted open fracture of the distal right femoral shaft, better evaluated on concomitant radiograph of the right femur and right knee.   Original Report Authenticated By: Rise Mu, M.D.   Results for orders placed during the hospital encounter of 07/01/13   CBC      Result Value Range   WBC 7.2  4.0 - 10.5 K/uL   RBC 4.53  3.87 - 5.11 MIL/uL   Hemoglobin 15.5 (*) 12.0 - 15.0 g/dL   HCT 16.1  09.6 - 04.5 %   MCV 96.9  78.0 - 100.0 fL   MCH 34.2 (*) 26.0 - 34.0 pg   MCHC 35.3  30.0 - 36.0 g/dL   RDW 40.9  81.1 - 91.4 %   Platelets 146 (*) 150 - 400 K/uL  COMPREHENSIVE METABOLIC PANEL      Result Value Range   Sodium 143  135 - 145 mEq/L   Potassium 3.8  3.5 - 5.1 mEq/L   Chloride 103  96 - 112 mEq/L   CO2 26  19 - 32 mEq/L   Glucose, Bld 128 (*) 70 - 99 mg/dL   BUN 13  6 - 23 mg/dL   Creatinine, Ser 7.82  0.50 - 1.10 mg/dL   Calcium 8.7  8.4 - 95.6 mg/dL   Total Protein 6.8  6.0 - 8.3 g/dL   Albumin 4.0  3.5 - 5.2 g/dL   AST 47 (*) 0 - 37 U/L   ALT 59 (*) 0 - 35 U/L   Alkaline Phosphatase 71  39 - 117 U/L   Total Bilirubin 0.4  0.3 - 1.2 mg/dL   GFR calc non Af Amer >90  >90 mL/min   GFR calc Af Amer >90  >90 mL/min  PROTIME-INR      Result Value Range   Prothrombin Time 13.0  11.6 - 15.2 seconds   INR 1.00  0.00 - 1.49  URINALYSIS, ROUTINE W REFLEX MICROSCOPIC      Result Value Range   Color, Urine YELLOW  YELLOW   APPearance CLEAR  CLEAR   Specific Gravity, Urine 1.016  1.005 - 1.030   pH 5.0  5.0 - 8.0   Glucose, UA NEGATIVE  NEGATIVE mg/dL   Hgb urine dipstick NEGATIVE  NEGATIVE   Bilirubin Urine NEGATIVE  NEGATIVE   Ketones, ur NEGATIVE  NEGATIVE mg/dL   Protein, ur NEGATIVE  NEGATIVE mg/dL   Urobilinogen, UA 0.2  0.0 - 1.0 mg/dL   Nitrite NEGATIVE  NEGATIVE   Leukocytes, UA NEGATIVE  NEGATIVE  TYPE AND SCREEN      Result Value Range   ABO/RH(D) O POS     Antibody Screen NEG     Sample Expiration 07/04/2013    ABO/RH      Result Value Range   ABO/RH(D) O POS      MDM   1. Femur midcervical fracture, right, open type I or II, initial  encounter    62 year old white female presents emergency department with chief complaint of right thigh pain.  Patient fell at home while walking up stairs carrying a box.  She denies head pain, chest pain, shortness of breath, neck pain, abdominal pain, back pain. She reports isolated pain to her right femur area. X-rays of pelvis and right lower extremity and pelvis reveal comminuted right femur fracture. She has a small 3 cm laceration to the right distal thigh. Distal neurovascular motor function intact. Compartments soft.  Case discussed with Dr. Lajoyce Corners orthopedics on call who recommends 1 L normal saline irrigation to wound, Betadine dressing, knee immobilizer, and admission.  ERC: Unasyn 1.5 g, tetanus update, pain medications, and above treatment as described per orthopedics.  Admit to orthopedics.  Ortho to evaluate in AM for surgery. Will keep NPO.        Darlys Gales, MD 07/01/13 1059

## 2013-07-02 ENCOUNTER — Encounter (HOSPITAL_COMMUNITY): Payer: Self-pay | Admitting: Orthopedic Surgery

## 2013-07-02 LAB — URINE CULTURE
Colony Count: NO GROWTH
Culture: NO GROWTH

## 2013-07-02 MED ORDER — OXYCODONE HCL 5 MG PO TABS
5.0000 mg | ORAL_TABLET | ORAL | Status: DC | PRN
  Administered 2013-07-03 – 2013-07-04 (×5): 10 mg via ORAL
  Filled 2013-07-02 (×5): qty 2

## 2013-07-02 MED ORDER — METHOCARBAMOL 500 MG PO TABS
500.0000 mg | ORAL_TABLET | Freq: Four times a day (QID) | ORAL | Status: DC | PRN
  Administered 2013-07-02 – 2013-07-04 (×7): 500 mg via ORAL
  Filled 2013-07-02 (×7): qty 1

## 2013-07-02 MED ORDER — WARFARIN SODIUM 5 MG PO TABS
5.0000 mg | ORAL_TABLET | Freq: Once | ORAL | Status: AC
  Administered 2013-07-02: 5 mg via ORAL
  Filled 2013-07-02: qty 1

## 2013-07-02 NOTE — Progress Notes (Signed)
Patient ID: [name], female   DOB: 06-Jul-1951, 62 y.o.   MRN: [id number] Patient slow with mobilization status post internal fixation for right open femur fracture. Orders written for evaluation for discharge to skilled nursing facility. IV discontinued, ordered  Robaxin.

## 2013-07-02 NOTE — Evaluation (Signed)
Physical Therapy Evaluation Patient Details Name: [name] MRN: [id number] DOB: 19-Jun-1951 Today's Date: 07/02/2013 Time: 4098-1191 PT Time Calculation (min): 25 min  PT Assessment / Plan / Recommendation History of Present Illness  s/p ORIF Rt femur  Clinical Impression  Patient is s/p ORIF Rt femur surgery due to fall resulting in functional limitations due to the deficits listed below (see PT Problem List). Patient will benefit from skilled PT to increase their independence and safety with mobility to allow discharge to the venue listed below. Pt will need to be MOD I for wheelchair <> toilet transfers prior to D/C home. Will be alone ~4-6 hours daily. Pt refusing STSNF. States a wheelchair will fit through her house.      PT Assessment  Patient needs continued PT services    Follow Up Recommendations  Home health PT;Supervision/Assistance - 24 hour    Does the patient have the potential to tolerate intense rehabilitation      Barriers to Discharge Inaccessible home environment pt has 5 STE and will be alone part of the day    Equipment Recommendations  Rolling walker with 5" wheels;3in1 (PT);Wheelchair (measurements PT);Wheelchair cushion (measurements PT);Other (comment) (with elevating leg rest)    Recommendations for Other Services OT consult   Frequency Min 5X/week    Precautions / Restrictions Precautions Precautions: Fall Restrictions Weight Bearing Restrictions: Yes RLE Weight Bearing: Non weight bearing   Pertinent Vitals/Pain 8/10; patient repositioned for comfort       Mobility  Bed Mobility Bed Mobility: Supine to Sit;Sitting - Scoot to Edge of Bed Supine to Sit: 4: Min assist;HOB elevated;With rails Sitting - Scoot to Edge of Bed: 4: Min guard Details for Bed Mobility Assistance: (A) to advance Rt LE to/off EOB; requires incr time due to pain and cues for sequencing Transfers Transfers: Sit to Stand;Stand to Sit;Stand Pivot Transfers Sit to  Stand: 3: Mod assist;From bed;From elevated surface;With upper extremity assist Stand to Sit: 3: Mod assist;To chair/3-in-1;With armrests;With upper extremity assist Stand Pivot Transfers: 3: Mod assist;With armrests;From elevated surface Details for Transfer Assistance: cues for hand placement and sequencing; cues to maintain NWB status on Rt LE; pt able to maintain NWB status with (A) to shift and facilitate pivot steps into chair  Ambulation/Gait Ambulation/Gait Assistance: Not tested (comment) Stairs: No Wheelchair Mobility Wheelchair Mobility: No    Exercises General Exercises - Lower Extremity Ankle Circles/Pumps: AROM;Both;10 reps;Supine   PT Diagnosis: Difficulty walking;Acute pain  PT Problem List: Decreased strength;Decreased range of motion;Decreased balance;Decreased mobility;Decreased knowledge of use of DME;Pain PT Treatment Interventions: DME instruction;Gait training;Stair training;Functional mobility training;Therapeutic activities;Therapeutic exercise;Balance training;Neuromuscular re-education;Patient/family education;Wheelchair mobility training     PT Goals(Current goals can be found in the care plan section) Acute Rehab PT Goals Patient Stated Goal: to go home  PT Goal Formulation: With patient Time For Goal Achievement: 07/09/13 Potential to Achieve Goals: Good  Visit Information  Last PT Received On: 07/02/13 Assistance Needed: +2 (if attempting to amb ) History of Present Illness: s/p ORIF Rt femur       Prior Functioning  Home Living Family/patient expects to be discharged to:: Private residence Living Arrangements: Spouse/significant other;Children Available Help at Discharge: Family;Available PRN/intermittently Type of Home: House Home Access: Stairs to enter [employer] of Steps: 5 Entrance Stairs-Rails: Can reach both Home Layout: Two level;Able to live on main level with bedroom/bathroom;1/2 bath on main level Home Equipment:  Crutches Additional Comments: standard toilet seat height; reports wheelchair can fit through house  Prior  Function Level of Independence: Independent Communication Communication: No difficulties Dominant Hand: Right    Cognition  Cognition Arousal/Alertness: Awake/alert Behavior During Therapy: WFL for tasks assessed/performed Overall Cognitive Status: Within Functional Limits for tasks assessed    Extremity/Trunk Assessment Upper Extremity Assessment Upper Extremity Assessment: Overall WFL for tasks assessed Lower Extremity Assessment Lower Extremity Assessment: RLE deficits/detail RLE: Unable to fully assess due to pain Cervical / Trunk Assessment Cervical / Trunk Assessment: Normal   Balance Balance Balance Assessed: Yes Static Sitting Balance Static Sitting - Balance Support: Bilateral upper extremity supported;Feet unsupported Static Sitting - Level of Assistance: 5: Stand by assistance Static Sitting - Comment/# of Minutes: pt tolerated sitting EOB ~4 min  End of Session PT - End of Session Equipment Utilized During Treatment: Gait belt Activity Tolerance: Patient tolerated treatment well Patient left: in chair;with call bell/phone within reach Nurse Communication: Mobility status  GP     Donell Sievert, [place] [phone] 07/02/2013, 2:02 PM

## 2013-07-02 NOTE — Progress Notes (Signed)
ANTICOAGULATION CONSULT NOTE - Initial Consult  Pharmacy Consult for warfarin Indication: VTE prophylaxis  Allergies  Allergen Reactions  . Codeine Nausea Only    Patient Measurements: Height: 5\' 1"  (154.9 cm) Weight: 134 lb (60.782 kg) IBW/kg (Calculated) : 47.8   Vital Signs: Temp: 98.5 F (36.9 C) (09/14 0608) Temp src: Oral (09/14 0608) BP: 98/49 mmHg (09/14 0940) Pulse Rate: 67 (09/14 0608)  Labs:  Recent Labs  07/01/13 0103 07/01/13 0105 07/02/13 0515  HGB 15.5*  --   --   HCT 43.9  --   --   PLT 146*  --   --   LABPROT  --  13.0 14.4  INR  --  1.00 1.14  CREATININE 0.57  --   --     Estimated Creatinine Clearance: 62.6 ml/min (by C-G formula based on Cr of 0.57).   Medical History: Past Medical History  Diagnosis Date  . Hypertension     Medications:  Scheduled:  . amLODipine  10 mg Oral Daily  . losartan  100 mg Oral Daily  . sertraline  50 mg Oral Daily  . Warfarin - Pharmacist Dosing Inpatient   Does not apply q1800    Assessment: 62 yo caucasian F s/p ORIF distal femur fracture from falling while going up stairs. Pharmacy consulted to dose warfarin for VTE prophylaxis. No anticoagulation PTA per records. Pt weight: 134lb (per pt) Coumadin score = 2 Baseline INR: 1  INR today: 1.14 (after 1 dose of 5 mg last night) No bleeding issues noted.  Goal of Therapy:  INR 2-3 Monitor platelets by anticoagulation protocol: Yes   Plan:  -Coumadin 5 mg x 1 per nomogram -daily INR -f/u bleeding complications -coumadin book and video ordered, f/u education  Hasana Alcorta C. Vikrant Pryce, PharmD Clinical Pharmacist-Resident Pager: [phone] Pharmacy: [phone] 07/02/2013 12:10 PM

## 2013-07-03 LAB — PROTIME-INR: INR: 1.4 (ref 0.00–1.49)

## 2013-07-03 MED ORDER — WARFARIN SODIUM 4 MG PO TABS
4.0000 mg | ORAL_TABLET | Freq: Once | ORAL | Status: AC
  Administered 2013-07-03: 4 mg via ORAL
  Filled 2013-07-03: qty 1

## 2013-07-03 NOTE — Progress Notes (Signed)
Physical Therapy Treatment Patient Details Name: [name] MRN: [id number] DOB: July 09, 1951 Today's Date: 07/03/2013 Time: 8657-8469 PT Time Calculation (min): 21 min  PT Assessment / Plan / Recommendation  History of Present Illness s/p ORIF Rt femur   PT Comments   Pt progressing well with therapy. Was able to amb today to door and back to chair with min to mod A. Pt with difficulty managing RW with turns. Patient needs to practice stairs next session prior to D/C home. Pt reports she will have 24/7 (A) with sister and husband.    Follow Up Recommendations  Home health PT;Supervision/Assistance - 24 hour     Does the patient have the potential to tolerate intense rehabilitation     Barriers to Discharge        Equipment Recommendations  Rolling walker with 5" wheels;3in1 (PT);Wheelchair (measurements PT);Wheelchair cushion (measurements PT);Other (comment)    Recommendations for Other Services OT consult  Frequency Min 5X/week   Progress towards PT Goals Progress towards PT goals: Progressing toward goals  Plan Current plan remains appropriate    Precautions / Restrictions Precautions Precautions: Fall Restrictions Weight Bearing Restrictions: Yes RLE Weight Bearing: Non weight bearing   Pertinent Vitals/Pain 7/10 with activity; patient repositioned for comfort in chair.      Mobility  Bed Mobility Bed Mobility: Supine to Sit;Sitting - Scoot to Edge of Bed Supine to Sit: 5: Supervision;HOB flat Sitting - Scoot to [place] of Bed: 5: Supervision Details for Bed Mobility Assistance: HOB flattened to simulate home enviroment and handrails taken away; pt requires increased time and cues to come to long sitting and use UE to advance Rt LE to/off EOB; c/o pain with movement  Transfers Transfers: Sit to Stand;Stand to Sit;Stand Pivot Transfers Sit to Stand: 4: Min assist;From bed;From chair/3-in-1;With upper extremity assist Stand to Sit: 4: Min assist;To chair/3-in-1;With  armrests;With upper extremity assist Stand Pivot Transfers: 4: Min assist;From elevated surface;With armrests Details for Transfer Assistance: performed SPT from bed to [facility] with min(A) to maintain balance and manage RW; cues for hand placement and sequencing; pt does good job mainitaining NWB status on Rt LE  Ambulation/Gait Ambulation/Gait Assistance: 3: Mod assist;4: Architect (Feet): 18 Feet Assistive device: Rolling walker Ambulation/Gait Assistance Details: pt requires mod (A) for turns; demo LOB with turning and trying to manage RW; was min (A) for gt; demo good ability to maintain NWB status on Rt LE; cues for seqencing and safety  Gait Pattern: Step-to pattern (hop to NWB on Rt LE ) Gait velocity: pt initially had difficulty advancing Lt LE with step; would slide Lt LE but progressed to hop to gt; pt has difficulty managing RW around turns  Stairs: No Wheelchair Mobility Wheelchair Mobility: No         PT Diagnosis:    PT Problem List:   PT Treatment Interventions:     PT Goals (current goals can now be found in the care plan section) Acute Rehab PT Goals Patient Stated Goal: home tomorrow PT Goal Formulation: With patient Time For Goal Achievement: 07/09/13 Potential to Achieve Goals: Good  Visit Information  Last PT Received On: 07/03/13 Assistance Needed: +2 (for amb and steps ) History of Present Illness: s/p ORIF Rt femur    Subjective Data  Subjective: Pt lying supine; agreeable to therapy. "i need to go to the bathroom before all this. Id like to try my crutches tomorrow when my husband brings them"  Patient Stated Goal: home tomorrow  Cognition  Cognition Arousal/Alertness: Awake/alert Behavior During Therapy: WFL for tasks assessed/performed Overall Cognitive Status: Within Functional Limits for tasks assessed    Balance  Balance Balance Assessed: Yes Static Sitting Balance Static Sitting - Balance Support: Bilateral upper extremity  supported;Feet unsupported Static Sitting - Level of Assistance: 5: Stand by assistance Static Standing Balance Static Standing - Balance Support: Bilateral upper extremity supported;During functional activity Static Standing - Level of Assistance: 5: Stand by assistance  End of Session PT - End of Session Equipment Utilized During Treatment: Gait belt Activity Tolerance: Patient tolerated treatment well Patient left: in chair;with call bell/phone within reach Nurse Communication: Mobility status   GP     Donell Sievert, [place] [phone] 07/03/2013, 11:58 AM

## 2013-07-03 NOTE — Progress Notes (Signed)
Patient selected [name] for home health services. [facility] called with referral for home health PT [facility] called with referral for RW, wc with cushion

## 2013-07-03 NOTE — Progress Notes (Signed)
ANTICOAGULATION CONSULT NOTE - Follow Up Consult  Pharmacy Consult for warfarin  Indication: VTE prophylaxis  Allergies  Allergen Reactions  . Codeine Nausea Only    Patient Measurements: Height: 5\' 1"  (154.9 cm) Weight: 134 lb (60.782 kg) IBW/kg (Calculated) : 47.8  Vital Signs: Temp: 99 F (37.2 C) (09/15 0508) Temp src: Oral (09/15 0508) BP: 117/61 mmHg (09/15 0508) Pulse Rate: 88 (09/15 0508)  Labs:  Recent Labs  07/01/13 0103 07/01/13 0105 07/02/13 0515 07/03/13 0755  HGB 15.5*  --   --   --   HCT 43.9  --   --   --   PLT 146*  --   --   --   LABPROT  --  13.0 14.4 16.8*  INR  --  1.00 1.14 1.40  CREATININE 0.57  --   --   --     Estimated Creatinine Clearance: 62.6 ml/min (by C-G formula based on Cr of 0.57).  Assessment: 62 year old female s/p ORIF distal femur fracture from falling while going up stairs. Pharmacy consulted to dose warfarin for VTE prophylaxis. INR trending up to 1.4 today. No bleeding issues noted.  Goal of Therapy:  INR 2-3 Monitor platelets by anticoagulation protocol: Yes   Plan:  Warfarin 4mg  today Daily INR  [name] 07/03/2013,3:04 PM

## 2013-07-03 NOTE — Progress Notes (Signed)
Patient ID: [name], female   DOB: [birth year], 62 y.o.   MRN: [id number] Patient does not want to go to short-term skilled nursing facility. Will have her continue with therapy today and anticipate discharge to home with home health therapy on Tuesday.

## 2013-07-03 NOTE — Progress Notes (Signed)
UR COMPLETED  

## 2013-07-04 ENCOUNTER — Encounter (HOSPITAL_COMMUNITY): Payer: Self-pay | Admitting: Orthopedic Surgery

## 2013-07-04 LAB — PROTIME-INR
INR: 1.32 (ref 0.00–1.49)
Prothrombin Time: 16.1 seconds — ABNORMAL HIGH (ref 11.6–15.2)

## 2013-07-04 MED ORDER — OXYCODONE-ACETAMINOPHEN 5-325 MG PO TABS: 1.0000 | ORAL_TABLET | ORAL | Status: DC | PRN

## 2013-07-04 MED ORDER — METHOCARBAMOL 500 MG PO TABS: 500.0000 mg | ORAL_TABLET | Freq: Three times a day (TID) | ORAL | Status: DC

## 2013-07-04 MED ORDER — ASPIRIN EC 325 MG PO TBEC: 325.0000 mg | DELAYED_RELEASE_TABLET | Freq: Every day | ORAL | Status: DC

## 2013-07-04 NOTE — Progress Notes (Signed)
Physical Therapy Treatment Patient Details Name: [name] MRN: [id number] DOB: 1951/04/29 Today's Date: 07/04/2013 Time: 2841-3244 PT Time Calculation (min): 23 min  PT Assessment / Plan / Recommendation  History of Present Illness s/p ORIF Rt femur   PT Comments   Patient progressing with ambulation. Attempted steps however will use WC to get into the house to increase safety as patient fearful with steps.   Follow Up Recommendations  Home health PT;Supervision/Assistance - 24 hour     Does the patient have the potential to tolerate intense rehabilitation     Barriers to Discharge        Equipment Recommendations  Rolling walker with 5" wheels;3in1 (PT);Wheelchair (measurements PT);Wheelchair cushion (measurements PT);Other (comment)    Recommendations for Other Services OT consult  Frequency Min 5X/week   Progress towards PT Goals Progress towards PT goals: Progressing toward goals  Plan Current plan remains appropriate    Precautions / Restrictions Precautions Precautions: Fall Restrictions RLE Weight Bearing: Non weight bearing   Pertinent Vitals/Pain no apparent distress     Mobility  Bed Mobility Bed Mobility: Not assessed Transfers Sit to Stand: From chair/3-in-1;With upper extremity assist;4: Min guard Stand to Sit: To chair/3-in-1;With armrests;With upper extremity assist;4: Min guard Ambulation/Gait Ambulation/Gait Assistance: 4: Min guard Ambulation Distance (Feet): 30 Feet Assistive device: Rolling walker;Crutches Ambulation/Gait Assistance Details: Patient tried crutches this session but felt more safe with RW for balance and NWB Gait Pattern: Step-to pattern Stairs: Yes Stairs Assistance: 3: Mod assist Stairs Assistance Details (indicate cue type and reason): A for balance as patient fearful coming back down steps. CUes for technique.  Stair Management Technique: Two rails;Backwards;With walker Number of Stairs: 2    Exercises     PT  Diagnosis:    PT Problem List:   PT Treatment Interventions:     PT Goals (current goals can now be found in the care plan section)    Visit Information  Last PT Received On: 07/04/13 Assistance Needed: +1 History of Present Illness: s/p ORIF Rt femur    Subjective Data      Cognition  Cognition Arousal/Alertness: Awake/alert Behavior During Therapy: WFL for tasks assessed/performed Overall Cognitive Status: Within Functional Limits for tasks assessed    Balance     End of Session PT - End of Session Equipment Utilized During Treatment: Gait belt Activity Tolerance: Patient tolerated treatment well Patient left: in chair;with call bell/phone within reach Nurse Communication: Mobility status   GP     [name] 07/04/2013, 1:14 PM  07/04/2013 [name] PTA [phone] pager [phone] office

## 2013-07-04 NOTE — Discharge Summary (Signed)
Physician Discharge Summary  Patient ID: [name] MRN: [id number] DOB/AGE: [birth year] 62 y.o.  Admit date: 07/01/2013 Discharge date: 07/04/2013  Admission Diagnoses: Intra-articular right distal femur fracture  Discharge Diagnoses: Intra-articular right distal femur fracture Active Problems:   * No active hospital problems. *   Discharged Condition: stable  Hospital Course: Patient's hospital course was essentially remarkable. She underwent open reduction and total fixation for the right distal femur fracture. Postoperatively patient progressed slowly with therapy and requested discharge to home with home health physical therapy.  Consults: None  Significant Diagnostic Studies: labs: Routine labs  Treatments: surgery: See operative note  Discharge Exam: Blood pressure 107/57, pulse 86, temperature 99.1 F (37.3 C), temperature source Oral, resp. rate 16, height 5\' 1"  (1.549 m), weight 60.782 kg (134 lb), SpO2 98.00%. Incision/Wound: incision clean dry and intact  Disposition: 01-Home or Self Care  Discharge Orders   Future Orders Complete By Expires   Call MD / Call 911  As directed    Comments:     If you experience chest pain or shortness of breath, CALL 911 and be transported to the hospital emergency room.  If you develope a fever above 101 F, pus (white drainage) or increased drainage or redness at the wound, or calf pain, call your surgeon's office.   Constipation Prevention  As directed    Comments:     Drink plenty of fluids.  Prune juice may be helpful.  You may use a stool softener, such as Colace (over the counter) 100 mg twice a day.  Use MiraLax (over the counter) for constipation as needed.   Diet - low sodium heart healthy  As directed    Increase activity slowly as tolerated  As directed        Medication List         amLODipine 10 MG tablet  Commonly known as:  NORVASC  Take 10 mg by mouth daily.     aspirin EC 325 MG tablet  Take 1 tablet (325  mg total) by mouth daily.     losartan 100 MG tablet  Commonly known as:  COZAAR  Take 100 mg by mouth daily.     methocarbamol 500 MG tablet  Commonly known as:  ROBAXIN  Take 1 tablet (500 mg total) by mouth 3 (three) times daily. As needed for spasm     oxyCODONE-acetaminophen 5-325 MG per tablet  Commonly known as:  ROXICET  Take 1 tablet by mouth every 4 (four) hours as needed for pain.     sertraline 50 MG tablet  Commonly known as:  ZOLOFT  Take 50 mg by mouth daily.           Follow-up Information   Follow up with DUDA,MARCUS V, MD In 2 weeks.   Specialty:  Orthopedic Surgery   Contact information:   [address] [place] [address] [phone]       Signed: Nadara Mustard 07/04/2013, 6:39 AM

## 2013-07-11 NOTE — Progress Notes (Signed)
SW received a consult for possible placement. PT  At this time is recommending home with HH and not SNF. Clinical Social Worker will sign off for now as social work intervention is no longer needed. Please consult us again if new need arises.   Elly Haffey, MSW, LCSWA  [phone] 

## 2017-08-18 ENCOUNTER — Other Ambulatory Visit: Payer: Self-pay | Admitting: Family Medicine

## 2017-08-18 DIAGNOSIS — Z139 Encounter for screening, unspecified: Secondary | ICD-10-CM

## 2017-08-19 DIAGNOSIS — E559 Vitamin D deficiency, unspecified: Secondary | ICD-10-CM

## 2017-08-19 HISTORY — DX: Vitamin D deficiency, unspecified: E55.9

## 2017-08-26 ENCOUNTER — Other Ambulatory Visit: Payer: Self-pay | Admitting: Acute Care

## 2017-08-26 DIAGNOSIS — Z87891 Personal history of nicotine dependence: Secondary | ICD-10-CM

## 2017-08-26 DIAGNOSIS — Z122 Encounter for screening for malignant neoplasm of respiratory organs: Secondary | ICD-10-CM

## 2017-09-15 ENCOUNTER — Ambulatory Visit (INDEPENDENT_AMBULATORY_CARE_PROVIDER_SITE_OTHER): Payer: Medicare Other | Admitting: Acute Care

## 2017-09-15 ENCOUNTER — Ambulatory Visit (INDEPENDENT_AMBULATORY_CARE_PROVIDER_SITE_OTHER)
Admission: RE | Admit: 2017-09-15 | Discharge: 2017-09-15 | Disposition: A | Discharge status: Home / Self Care | Payer: Medicare Other | Source: Ambulatory Visit | Attending: Acute Care | Admitting: Acute Care

## 2017-09-15 ENCOUNTER — Encounter: Payer: Self-pay | Admitting: Acute Care

## 2017-09-15 DIAGNOSIS — Z87891 Personal history of nicotine dependence: Secondary | ICD-10-CM | POA: Diagnosis not present

## 2017-09-15 DIAGNOSIS — Z122 Encounter for screening for malignant neoplasm of respiratory organs: Secondary | ICD-10-CM

## 2017-09-15 NOTE — Progress Notes (Signed)
Shared Decision Making Visit Lung Cancer Screening Program [phone])   Eligibility:  Age 66 y.o.  Pack Years Smoking History Calculation 44 pack years (# packs/per year x # years smoked)  Recent History of coughing up blood  no  Unexplained weight loss? no ( >Than 15 pounds within the last 6 months )  Prior History Lung / other cancer no (Diagnosis within the last 5 years already requiring surveillance chest CT Scans).  Smoking Status Former Smoker  Former Smokers: Years since quit: 4 years  Quit Date: 2014  Visit Components:  Discussion included one or more decision making aids. yes  Discussion included risk/benefits of screening. yes  Discussion included potential follow up diagnostic testing for abnormal scans. yes  Discussion included meaning and risk of over diagnosis. yes  Discussion included meaning and risk of False Positives. yes  Discussion included meaning of total radiation exposure. yes  Counseling Included:  Importance of adherence to annual lung cancer LDCT screening. yes  Impact of comorbidities on ability to participate in the program. yes  Ability and willingness to under diagnostic treatment. yes  Smoking Cessation Counseling:  Current Smokers:   Discussed importance of smoking cessation. yes  Information about tobacco cessation classes and interventions provided to patient. yes  Patient provided with "ticket" for LDCT Scan. yes  Symptomatic Patient. no  Counseling  Diagnosis Code: Tobacco Use Z72.0  Asymptomatic Patient yes  Counseling (Intermediate counseling: > three minutes counseling) B3419  Former Smokers:   Discussed the importance of maintaining cigarette abstinence. yes  Diagnosis Code: Personal History of Nicotine Dependence. F79.024  Information about tobacco cessation classes and interventions provided to patient. Yes  Patient provided with "ticket" for LDCT Scan. yes  Written Order for Lung Cancer Screening with  LDCT placed in Epic. Yes (CT Chest Lung Cancer Screening Low Dose W/O CM) [id number] Z12.2-Screening of respiratory organs Z87.891-Personal history of nicotine dependence  I spent 25 minutes of face to face time with [name] discussing the risks and benefits of lung cancer screening. We viewed a power point together that explained in detail the above noted topics. We took the time to pause the power point at intervals to allow for questions to be asked and answered to ensure understanding. We discussed that she had taken the single most powerful action possible to decrease her risk of developing lung cancer when she quit smoking. I counseled her to remain smoke free, and to contact me if she ever had the desire to smoke again so that I can provide resources and tools to help support the effort to remain smoke free. We discussed the time and location of the scan, and that either  Doroteo Glassman RN or I will call with the results within  24-48 hours of receiving them. she has my card and contact information in the event she needs to speak with me, in addition to a copy of the power point we reviewed as a resource. She verbalized understanding of all of the above and had no further questions upon leaving the office.     I explained to the patient that there has been a high incidence of coronary artery disease noted on these exams. I explained that this is a non-gated exam therefore degree or severity cannot be determined. This patient is not on statin therapy. I have asked the patient to follow-up with their PCP regarding any incidental finding of coronary artery disease and management with diet or medication as they feel is clinically indicated. The  patient verbalized understanding of the above and had no further questions.     Sharon Spatz, NP 09/15/2017

## 2017-09-20 ENCOUNTER — Telehealth: Payer: Self-pay | Admitting: Acute Care

## 2017-09-20 DIAGNOSIS — Z122 Encounter for screening for malignant neoplasm of respiratory organs: Secondary | ICD-10-CM

## 2017-09-20 DIAGNOSIS — Z87891 Personal history of nicotine dependence: Secondary | ICD-10-CM

## 2017-09-20 NOTE — Telephone Encounter (Signed)
Pt informed of CT results per Sarah Groce, NP.  PT verbalized understanding.  Copy sent to PCP.  Order placed for 1 yr f/u CT.  

## 2017-09-22 DIAGNOSIS — I700 Atherosclerosis of aorta: Secondary | ICD-10-CM | POA: Diagnosis not present

## 2017-09-22 DIAGNOSIS — R7309 Other abnormal glucose: Secondary | ICD-10-CM | POA: Diagnosis not present

## 2017-10-01 ENCOUNTER — Ambulatory Visit
Admission: RE | Admit: 2017-10-01 | Discharge: 2017-10-01 | Disposition: A | Discharge status: Home / Self Care | Payer: Medicare Other | Source: Ambulatory Visit | Attending: Family Medicine | Admitting: Family Medicine

## 2017-10-01 DIAGNOSIS — Z139 Encounter for screening, unspecified: Secondary | ICD-10-CM

## 2017-10-01 DIAGNOSIS — Z1231 Encounter for screening mammogram for malignant neoplasm of breast: Secondary | ICD-10-CM | POA: Diagnosis not present

## 2017-10-04 ENCOUNTER — Telehealth: Payer: Self-pay | Admitting: Hematology and Oncology

## 2017-10-04 NOTE — Telephone Encounter (Signed)
Left message for patient regarding appointment with Date/ Time / Location & phone number.  Letter mailed to pateint

## 2017-10-07 DIAGNOSIS — R05 Cough: Secondary | ICD-10-CM | POA: Diagnosis not present

## 2017-10-07 DIAGNOSIS — J069 Acute upper respiratory infection, unspecified: Secondary | ICD-10-CM | POA: Diagnosis not present

## 2017-10-08 ENCOUNTER — Telehealth: Payer: Self-pay | Admitting: Hematology and Oncology

## 2017-10-15 DIAGNOSIS — J209 Acute bronchitis, unspecified: Secondary | ICD-10-CM | POA: Diagnosis not present

## 2017-10-21 ENCOUNTER — Encounter: Payer: Medicare Other | Admitting: Hematology and Oncology

## 2017-11-02 ENCOUNTER — Encounter: Payer: Self-pay | Admitting: Dietician

## 2017-11-02 ENCOUNTER — Encounter: Payer: Medicare Other | Attending: Family Medicine | Admitting: Dietician

## 2017-11-02 DIAGNOSIS — Z713 Dietary counseling and surveillance: Secondary | ICD-10-CM | POA: Diagnosis not present

## 2017-11-02 DIAGNOSIS — E119 Type 2 diabetes mellitus without complications: Secondary | ICD-10-CM | POA: Diagnosis not present

## 2017-11-02 NOTE — Progress Notes (Signed)
Patient was seen on 11/02/17 for the first of a series of three diabetes self-management courses at the [facility]  Patient Education Plan per assessed needs and concerns is to attend four course education program for Diabetes Self Management Education.  The following learning objectives were met by the patient during this class:  Describe diabetes  State some common risk factors for diabetes  Defines the role of glucose and insulin  Identifies type of diabetes and pathophysiology  Describe the relationship between diabetes and cardiovascular risk  State the members of the Healthcare Team  States the rationale for glucose monitoring  State when to test glucose  State their individual Target Range  State the importance of logging glucose readings  Describe how to interpret glucose readings  Identifies A1C target  Explain the correlation between A1c and eAG values  State symptoms and treatment of high blood glucose  State symptoms and treatment of low blood glucose  Explain proper technique for glucose testing  Identifies proper sharps disposal  Handouts given during class include:  Living Well with Diabetes book  Carb Counting and Meal Planning book  Meal Plan Card  Carbohydrate guide  Meal planning worksheet  Low Sodium Flavoring Tips  The diabetes portion plate  O8C to eAG Conversion Chart  Diabetes Medications  Diabetes Recommended Care Schedule  Support Group  Diabetes Success Plan  Core Class Satisfaction Survey   Follow-Up Plan:  Attend core 2

## 2017-11-03 ENCOUNTER — Telehealth: Payer: Self-pay | Admitting: Hematology and Oncology

## 2017-11-03 ENCOUNTER — Inpatient Hospital Stay: Payer: Medicare Other | Attending: Hematology and Oncology | Admitting: Hematology and Oncology

## 2017-11-03 DIAGNOSIS — E119 Type 2 diabetes mellitus without complications: Secondary | ICD-10-CM

## 2017-11-03 DIAGNOSIS — E559 Vitamin D deficiency, unspecified: Secondary | ICD-10-CM

## 2017-11-03 DIAGNOSIS — R948 Abnormal results of function studies of other organs and systems: Secondary | ICD-10-CM

## 2017-11-03 DIAGNOSIS — I10 Essential (primary) hypertension: Secondary | ICD-10-CM

## 2017-11-03 NOTE — Telephone Encounter (Signed)
Scheduled appt pe r1/16 los - Gave patient AVS and calender per los.  

## 2017-11-03 NOTE — Assessment & Plan Note (Signed)
Elevated liver function tests with elevated ferritin Ferritin 08/2017: 918.9, iron saturation 43% AST 122, ALT 132 Hemoglobin 15 platelets 125  Counseling: I discussed with the patient the entire mechanism of hemochromatosis.  We discussed the clinical symptoms of hemochromatosis including elevation of liver function tests as well as skin hyperpigmentation, diabetes, arthralgias and sometimes EKG abnormalities. The severity of the symptoms depending on whether the patient is homozygous or heterozygous.  Diagnosis: Hemochromatosis gene testing Plan: Phlebotomy to remove iron initially once a week for 4 weeks  Goal: Ferritin level below 100  After the initial 4 weeks we will decide how often she needs to have phlebotomy.

## 2017-11-03 NOTE — Progress Notes (Signed)
[facility] NOTE  Patient Care Team: Lawerance Cruel, MD as PCP - General (Family Medicine)  CHIEF COMPLAINTS/PURPOSE OF CONSULTATION:  hereditary hemochromatosis  HISTORY OF PRESENTING ILLNESS:  [name] 67 y.o. female is here because of a prior diagnosis of hemochromatosis.  She was diagnosed in 2005 after having undergone a liver biopsy and had undergone phlebotomy treatments weekly for couple of months and then monthly thereafter.  Since she moved to [facility] she has not followed up with hematology and has not had any phlebotomies other than occasional blood transfusions since 2007.  Recently her primary care physician obtain blood work including ferritin which was elevated in the 900s and elevation of liver function tests as well as recent onset of diabetes and she was asked to follow-up with [place] for hemochromatosis.  She reports no major symptoms other than the lab abnormalities that were noted recently.  I reviewed her records extensively and collaborated the history with the patient.   MEDICAL HISTORY:  Past Medical History:  Diagnosis Date  . Diabetes mellitus without complication ([place])   . Hypertension   . Vitamin D deficiency 08/2017    SURGICAL HISTORY: Past Surgical History:  Procedure Laterality Date  . FRACTURE SURGERY    . I&D EXTREMITY Right 07/01/2013   Procedure: IRRIGATION AND DEBRIDEMENT RIGHT LEG;  Surgeon: Newt Minion, MD;  Location: [facility];  Service: Orthopedics;  Laterality: Right;  . ORIF FEMUR FRACTURE Right 07/01/2013   Procedure: OPEN REDUCTION INTERNAL FIXATION (ORIF) DISTAL FEMUR FRACTURE;  Surgeon: Newt Minion, MD;  Location: [place];  Service: Orthopedics;  Laterality: Right;    SOCIAL HISTORY: Social History   Socioeconomic History  . Marital status: Married    Spouse name: Not on file  . Number of children: Not on file  . Years of education: Not on file  . Highest education level: Not on file  Social Needs  .  Financial resource strain: Not on file  . Food insecurity - worry: Not on file  . Food insecurity - inability: Not on file  . Transportation needs - medical: Not on file  . Transportation needs - non-medical: Not on file  Occupational History  . Not on file  Tobacco Use  . Smoking status: Former Smoker    Packs/day: 1.00    Years: 44.00    Pack years: 44.00    Types: Cigarettes  . Smokeless tobacco: Never Used  Substance and Sexual Activity  . Alcohol use: Not on file  . Drug use: Not on file  . Sexual activity: Not on file  Other Topics Concern  . Not on file  Social History Narrative  . Not on file    FAMILY HISTORY: No family history on file.  ALLERGIES:  is allergic to codeine.  MEDICATIONS:  Current Outpatient Medications  Medication Sig Dispense Refill  . amLODipine (NORVASC) 10 MG tablet Take 10 mg by mouth daily.    Marland Kitchen aspirin EC 325 MG tablet Take 1 tablet (325 mg total) by mouth daily. 30 tablet 0  . cholecalciferol (VITAMIN D) 1000 units tablet Take 5,000 Units by mouth daily.    Marland Kitchen losartan (COZAAR) 100 MG tablet Take 100 mg by mouth daily.    . methocarbamol (ROBAXIN) 500 MG tablet Take 1 tablet (500 mg total) by mouth 3 (three) times daily. As needed for spasm 30 tablet 0  . oxyCODONE-acetaminophen (ROXICET) 5-325 MG per tablet Take 1 tablet by mouth every 4 (four) hours  as needed for pain. 60 tablet 0  . pravastatin (PRAVACHOL) 40 MG tablet Take 40 mg by mouth daily.    . sertraline (ZOLOFT) 50 MG tablet Take 50 mg by mouth daily.     No current facility-administered medications for this visit.     REVIEW OF SYSTEMS:   Constitutional: Denies fevers, chills or abnormal night sweats Eyes: Denies blurriness of vision, double vision or watery eyes Ears, nose, mouth, throat, and face: Denies mucositis or sore throat Respiratory: Denies cough, dyspnea or wheezes Cardiovascular: Denies palpitation, chest discomfort or lower extremity swelling Gastrointestinal:   Denies nausea, heartburn or change in bowel habits Skin: Denies abnormal skin rashes Lymphatics: Denies new lymphadenopathy or easy bruising Neurological:Denies numbness, tingling or new weaknesses Behavioral/Psych: Mood is stable, no new changes   All other systems were reviewed with the patient and are negative.  PHYSICAL EXAMINATION: ECOG PERFORMANCE STATUS: 1 - Symptomatic but completely ambulatory  Vitals:   11/03/17 1557  BP: 137/73  Pulse: 71  Resp: 20  Temp: 98.1 F (36.7 C)  SpO2: 95%   Filed Weights   11/03/17 1557  Weight: 157 lb 14.4 oz (71.6 kg)    GENERAL:alert, no distress and comfortable SKIN: skin color, texture, turgor are normal, no rashes or significant lesions EYES: normal, conjunctiva are pink and non-injected, sclera clear OROPHARYNX:no exudate, no erythema and lips, buccal mucosa, and tongue normal  NECK: supple, thyroid normal size, non-tender, without nodularity LYMPH:  no palpable lymphadenopathy in the cervical, axillary or inguinal LUNGS: clear to auscultation and percussion with normal breathing effort HEART: regular rate & rhythm and no murmurs and no lower extremity edema ABDOMEN:abdomen soft, non-tender and normal bowel sounds Musculoskeletal:no cyanosis of digits and no clubbing  PSYCH: alert & oriented x 3 with fluent speech NEURO: no focal motor/sensory deficits   LABORATORY DATA:  I have reviewed the data as listed Lab Results  Component Value Date   WBC 7.2 07/01/2013   HGB 15.5 (H) 07/01/2013   HCT 43.9 07/01/2013   MCV 96.9 07/01/2013   PLT 146 (L) 07/01/2013   Lab Results  Component Value Date   NA 143 07/01/2013   K 3.8 07/01/2013   CL 103 07/01/2013   CO2 26 07/01/2013    RADIOGRAPHIC STUDIES: I have personally reviewed the radiological reports and agreed with the findings in the report.  ASSESSMENT AND PLAN:  Hereditary hemochromatosis ([place]) Elevated liver function tests with elevated ferritin Ferritin 08/2017:  918.9, iron saturation 43% AST 122, ALT 132 Hemoglobin 15 platelets 125  Counseling: I discussed with the patient the entire mechanism of hemochromatosis.  We discussed the clinical symptoms of hemochromatosis including elevation of liver function tests as well as skin hyperpigmentation, diabetes, arthralgias and sometimes EKG abnormalities. The severity of the symptoms depending on whether the patient is homozygous or heterozygous.  Plan: Hemochromatosis gene testing will be obtained Plan: Phlebotomy to remove iron initially once a week for 4 weeks plan to start phlebotomy next Tuesday, 11/09/2017 Goal: Ferritin level below 100  After the initial 4 weeks we will decide how often she needs to have phlebotomy.   All questions were answered. The patient knows to call the clinic with any problems, questions or concerns.    Harriette Ohara, MD 11/03/17

## 2017-11-09 ENCOUNTER — Other Ambulatory Visit: Payer: Medicare Other

## 2017-11-09 ENCOUNTER — Encounter: Payer: Medicare Other | Admitting: Dietician

## 2017-11-09 ENCOUNTER — Other Ambulatory Visit: Payer: Self-pay

## 2017-11-09 ENCOUNTER — Inpatient Hospital Stay: Payer: Medicare Other

## 2017-11-09 DIAGNOSIS — E119 Type 2 diabetes mellitus without complications: Secondary | ICD-10-CM

## 2017-11-09 DIAGNOSIS — I10 Essential (primary) hypertension: Secondary | ICD-10-CM | POA: Diagnosis not present

## 2017-11-09 DIAGNOSIS — R948 Abnormal results of function studies of other organs and systems: Secondary | ICD-10-CM | POA: Diagnosis not present

## 2017-11-09 DIAGNOSIS — Z713 Dietary counseling and surveillance: Secondary | ICD-10-CM | POA: Diagnosis not present

## 2017-11-09 DIAGNOSIS — E559 Vitamin D deficiency, unspecified: Secondary | ICD-10-CM | POA: Diagnosis not present

## 2017-11-09 NOTE — Patient Instructions (Signed)

## 2017-11-09 NOTE — Progress Notes (Addendum)
Patient was seen on 11/09/17 for the second of a series of three diabetes self-management courses at the [facility]. The following learning objectives were met by the patient during this class:   Describe the role of different macronutrients on glucose  Explain how carbohydrates affect blood glucose  State what foods contain the most carbohydrates  Demonstrate carbohydrate counting  Demonstrate how to read Nutrition Facts food label  Describe effects of various fats on heart health  Describe the importance of good nutrition for health and healthy eating strategies  Describe techniques for managing your shopping, cooking and meal planning  List strategies to follow meal plan when dining out  Describe the effects of alcohol on glucose and how to use it safely  Goals:  Follow Diabetes Meal Plan as instructed  Aim to spread carbs evenly throughout the day  Aim for 3 meals per day and snacks as needed Include lean protein foods to meals/snacks  Monitor glucose levels as instructed by your doctor   Follow-Up Plan:  Attend Core 3  Work towards following your personal food plan.    

## 2017-11-09 NOTE — Progress Notes (Signed)
[name] presents today for phlebotomy per MD orders. Vitals WNL. 20G needle used for procedure in RAC.  Snack and drink offered. Snack ate prior to procedure.  Phlebotomy procedure started at 1356 and ended at 1410. 500 grams removed. Patient refusing to stay for 30 min observation.  BP is 128/77, HR 84.  Patient tolerated procedure well. IV needle removed intact.

## 2017-11-16 ENCOUNTER — Inpatient Hospital Stay: Payer: Medicare Other

## 2017-11-16 ENCOUNTER — Encounter: Payer: Medicare Other | Admitting: Dietician

## 2017-11-16 DIAGNOSIS — E559 Vitamin D deficiency, unspecified: Secondary | ICD-10-CM | POA: Diagnosis not present

## 2017-11-16 DIAGNOSIS — R948 Abnormal results of function studies of other organs and systems: Secondary | ICD-10-CM | POA: Diagnosis not present

## 2017-11-16 DIAGNOSIS — Z713 Dietary counseling and surveillance: Secondary | ICD-10-CM | POA: Diagnosis not present

## 2017-11-16 DIAGNOSIS — I10 Essential (primary) hypertension: Secondary | ICD-10-CM | POA: Diagnosis not present

## 2017-11-16 DIAGNOSIS — E119 Type 2 diabetes mellitus without complications: Secondary | ICD-10-CM | POA: Diagnosis not present

## 2017-11-16 NOTE — Patient Instructions (Signed)

## 2017-11-16 NOTE — Progress Notes (Signed)
Patient was seen on 11/16/17 for the third of a series of three diabetes self-management courses at the [facility].   Catalina Gravel the amount of activity recommended for healthy living . Describe activities suitable for individual needs . Identify ways to regularly incorporate activity into daily life . Identify barriers to activity and ways to over come these barriers  Identify diabetes medications being personally used and their primary action for lowering glucose and possible side effects . Describe role of stress on blood glucose and develop strategies to address psychosocial issues . Identify diabetes complications and ways to prevent them  Explain how to manage diabetes during illness . Evaluate success in meeting personal goal . Establish 2-3 goals that they will plan to diligently work on until they return for the  67-month follow-up visit  Goals:   I will count my carb choices at most meals and snacks  I will be active 30 minutes or more 6 times a week  To help manage stress I will  meditate at least 5 times a week  Your patient has identified these potential barriers to change:  Motivation  Your patient has identified their diabetes self-care support plan as  Family Support    Plan:  Attend Support Group as desired

## 2017-11-16 NOTE — Progress Notes (Signed)
Patient for phlebotomy today per MD orders. Vital signs WNL. Right AC accessed. Phlebotomy started at 1320 and ended at 1330. Pt. Tolerated well. 500g removed. Patient snacking on graham crackers and coke at 1340 and states she feels good. Will remain for remainder of observation period.

## 2017-11-16 NOTE — Progress Notes (Signed)
Patient discharged. VSS. Pt. States she feels good. No complaints.

## 2017-11-23 ENCOUNTER — Inpatient Hospital Stay: Payer: Medicare Other | Attending: Hematology and Oncology

## 2017-11-23 DIAGNOSIS — R948 Abnormal results of function studies of other organs and systems: Secondary | ICD-10-CM | POA: Diagnosis not present

## 2017-11-23 DIAGNOSIS — Z79899 Other long term (current) drug therapy: Secondary | ICD-10-CM | POA: Diagnosis not present

## 2017-11-30 ENCOUNTER — Telehealth: Payer: Self-pay

## 2017-11-30 DIAGNOSIS — J4521 Mild intermittent asthma with (acute) exacerbation: Secondary | ICD-10-CM | POA: Diagnosis not present

## 2017-11-30 DIAGNOSIS — R05 Cough: Secondary | ICD-10-CM | POA: Diagnosis not present

## 2017-11-30 DIAGNOSIS — Z87891 Personal history of nicotine dependence: Secondary | ICD-10-CM | POA: Diagnosis not present

## 2017-11-30 DIAGNOSIS — B349 Viral infection, unspecified: Secondary | ICD-10-CM | POA: Diagnosis not present

## 2017-11-30 NOTE — Telephone Encounter (Signed)
Pt called and stated she is sick and is not able to come to appt today for phlebotomy. Canceled appt for patient and told her to call to reschedule.  Cyndia Bent RN

## 2017-12-06 ENCOUNTER — Inpatient Hospital Stay: Payer: Medicare Other

## 2017-12-06 ENCOUNTER — Inpatient Hospital Stay (HOSPITAL_BASED_OUTPATIENT_CLINIC_OR_DEPARTMENT_OTHER): Payer: Medicare Other | Admitting: Hematology and Oncology

## 2017-12-06 ENCOUNTER — Telehealth: Payer: Self-pay | Admitting: Hematology and Oncology

## 2017-12-06 ENCOUNTER — Ambulatory Visit: Payer: Medicare Other | Admitting: Hematology and Oncology

## 2017-12-06 DIAGNOSIS — Z79899 Other long term (current) drug therapy: Secondary | ICD-10-CM | POA: Diagnosis not present

## 2017-12-06 DIAGNOSIS — R948 Abnormal results of function studies of other organs and systems: Secondary | ICD-10-CM | POA: Diagnosis not present

## 2017-12-06 LAB — CBC WITH DIFFERENTIAL (CANCER CENTER ONLY)
BASOS PCT: 1 %
Basophils Absolute: 0 10*3/uL (ref 0.0–0.1)
Eosinophils Absolute: 0.2 10*3/uL (ref 0.0–0.5)
Eosinophils Relative: 4 %
HCT: 38.2 % (ref 34.8–46.6)
Hemoglobin: 13.1 g/dL (ref 11.6–15.9)
Lymphocytes Relative: 22 %
Lymphs Abs: 0.9 10*3/uL (ref 0.9–3.3)
MCH: 34 pg (ref 25.1–34.0)
MCHC: 34.3 g/dL (ref 31.5–36.0)
MCV: 99.2 fL (ref 79.5–101.0)
MONO ABS: 0.2 10*3/uL (ref 0.1–0.9)
MONOS PCT: 6 %
Neutro Abs: 2.8 10*3/uL (ref 1.5–6.5)
Neutrophils Relative %: 67 %
PLATELETS: 132 10*3/uL — AB (ref 145–400)
RBC: 3.85 MIL/uL (ref 3.70–5.45)
RDW: 13.9 % (ref 11.2–14.5)
WBC Count: 4.2 10*3/uL (ref 3.9–10.3)

## 2017-12-06 NOTE — Progress Notes (Signed)
[name] presents today for phlebotomy per MD orders. Drink and snack offered.  RAC 16G used.  Phlebotomy procedure started at 1500 and ended at 1504. 538 cc removed. Patient tolerated procedure well. IV needle removed intact.   Pt refused to stay 30 minutes post. VSS and pt states she is asymptomatic.

## 2017-12-06 NOTE — Telephone Encounter (Signed)
Scheduled appt per 2/18 los - Gave patient AVS and calender per los.

## 2017-12-06 NOTE — Assessment & Plan Note (Signed)
Elevated liver function tests with elevated ferritin Ferritin 08/2017: 918.9, iron saturation 43% AST 122, ALT 132 Hemoglobin 15 platelets 125  Plan: Phlebotomy to remove iron weekly for 4 weeks completed 12/07/17 Now we plan to do it q 2 weeks   Goal: Ferritin level below 100  RTC in 8 weeks with labs to assess ferritin.

## 2017-12-06 NOTE — Progress Notes (Signed)
Patient Care Team: Lawerance Cruel, MD as PCP - General (Family Medicine)  DIAGNOSIS:  Encounter Diagnosis  Name Primary?  . Hereditary hemochromatosis ([place]) Yes    CHIEF COMPLIANT: Patient undergone 3 weeks of phlebotomy today's week 4  INTERVAL HISTORY: [name] is a 67 year old lady with above-mentioned history of hereditary hemochromatosis who is here to undergo her fourth week of phlebotomy.  Last week she called and canceled phlebotomy.  This was because she had flulike illness.  She has not noticed any change in her health or her performance status.  Hemochromatosis gene testing has been sent and we do not have those results.  Ferritin was drawn today and I do not have that result as well.  REVIEW OF SYSTEMS:   Constitutional: Denies fevers, chills or abnormal weight loss Eyes: Denies blurriness of vision Ears, nose, mouth, throat, and face: Denies mucositis or sore throat Respiratory: Denies cough, dyspnea or wheezes Cardiovascular: Denies palpitation, chest discomfort Gastrointestinal:  Denies nausea, heartburn or change in bowel habits Skin: Denies abnormal skin rashes Lymphatics: Denies new lymphadenopathy or easy bruising Neurological:Denies numbness, tingling or new weaknesses Behavioral/Psych: Mood is stable, no new changes  Extremities: No lower extremity edema  All other systems were reviewed with the patient and are negative.  I have reviewed the past medical history, past surgical history, social history and family history with the patient and they are unchanged from previous note.  ALLERGIES:  is allergic to codeine.  MEDICATIONS:  Current Outpatient Medications  Medication Sig Dispense Refill  . amLODipine (NORVASC) 10 MG tablet Take 10 mg by mouth daily.    . cholecalciferol (VITAMIN D) 1000 units tablet Take 5,000 Units by mouth daily.    Marland Kitchen losartan (COZAAR) 100 MG tablet Take 100 mg by mouth daily.    . pravastatin (PRAVACHOL) 40 MG tablet Take  40 mg by mouth daily.    . sertraline (ZOLOFT) 50 MG tablet Take 50 mg by mouth daily.     No current facility-administered medications for this visit.     PHYSICAL EXAMINATION: ECOG PERFORMANCE STATUS: 1  Vitals:   12/06/17 1337  BP: 138/69  Pulse: 82  Resp: 18  Temp: 98.3 F (36.8 C)  SpO2: 95%   Filed Weights   12/06/17 1337  Weight: 157 lb (71.2 kg)    GENERAL:alert, no distress and comfortable SKIN: skin color, texture, turgor are normal, no rashes or significant lesions EYES: normal, Conjunctiva are pink and non-injected, sclera clear OROPHARYNX:no exudate, no erythema and lips, buccal mucosa, and tongue normal  NECK: supple, thyroid normal size, non-tender, without nodularity LYMPH:  no palpable lymphadenopathy in the cervical, axillary or inguinal LUNGS: clear to auscultation and percussion with normal breathing effort HEART: regular rate & rhythm and no murmurs and no lower extremity edema ABDOMEN:abdomen soft, non-tender and normal bowel sounds MUSCULOSKELETAL:no cyanosis of digits and no clubbing  NEURO: alert & oriented x 3 with fluent speech, no focal motor/sensory deficits EXTREMITIES: No lower extremity edema  LABORATORY DATA:  I have reviewed the data as listed CMP Latest Ref Rng & Units 07/01/2013 06/07/2011 06/06/2011  Glucose 70 - 99 mg/dL 128(H) 111(H) 113(H)  BUN 6 - 23 mg/dL 13 13 16   Creatinine 0.50 - 1.10 mg/dL 0.57 <0.47(L) <0.47(L)  Sodium 135 - 145 mEq/L 143 139 140  Potassium 3.5 - 5.1 mEq/L 3.8 3.4(L) 3.6  Chloride 96 - 112 mEq/L 103 102 101  CO2 19 - 32 mEq/L 26 28 29   Calcium 8.4 -  10.5 mg/dL 8.7 9.3 10.1  Total Protein 6.0 - 8.3 g/dL 6.8 - -  Total Bilirubin 0.3 - 1.2 mg/dL 0.4 - -  Alkaline Phos 39 - 117 U/L 71 - -  AST 0 - 37 U/L 47(H) - -  ALT 0 - 35 U/L 59(H) - -    Lab Results  Component Value Date   WBC 4.2 12/06/2017   HGB 15.5 (H) 07/01/2013   HCT 38.2 12/06/2017   MCV 99.2 12/06/2017   PLT 132 (L) 12/06/2017    NEUTROABS 2.8 12/06/2017    ASSESSMENT & PLAN:  Hereditary hemochromatosis ([place]) Elevated liver function tests with elevated ferritin Ferritin 08/2017: 918.9, iron saturation 43% AST 122, ALT 132 Hemoglobin 15 platelets 125  Plan: Phlebotomy to remove iron weekly for 4 weeks completed 12/07/17 Now we plan to do it q 2 weeks   Goal: Ferritin level below 100  RTC in 8 weeks with labs to assess ferritin.   I spent 25 minutes talking to the patient of which more than half was spent in counseling and coordination of care.  Orders Placed This Encounter  Procedures  . Ferritin    Standing Status:   Future    Number of Occurrences:   1    Standing Expiration Date:   12/06/2018  . Iron and TIBC    Standing Status:   Future    Number of Occurrences:   1    Standing Expiration Date:   12/06/2018  . CBC with Differential (Cancer Center Only)    Standing Status:   Future    Number of Occurrences:   1    Standing Expiration Date:   12/06/2018   The patient has a good understanding of the overall plan. she agrees with it. she will call with any problems that may develop before the next visit here.   Harriette Ohara, MD 12/06/17

## 2017-12-07 ENCOUNTER — Ambulatory Visit: Payer: Medicare Other | Admitting: Hematology and Oncology

## 2017-12-07 LAB — IRON AND TIBC
IRON: 97 ug/dL (ref 41–142)
Saturation Ratios: 30 % (ref 21–57)
TIBC: 329 ug/dL (ref 236–444)
UIBC: 232 ug/dL

## 2017-12-07 LAB — FERRITIN: Ferritin: 769 ng/mL — ABNORMAL HIGH (ref 9–269)

## 2017-12-10 ENCOUNTER — Telehealth: Payer: Self-pay | Admitting: *Deleted

## 2017-12-10 NOTE — Telephone Encounter (Signed)
Pt left message stating she is still awaiting results of labs obtained earlier this week.  Per this RN's return call - obtained VM- message left informing pt to return call so results can be given person to person for verification/HIPPA

## 2017-12-13 LAB — HEMOCHROMATOSIS DNA-PCR(C282Y,H63D)

## 2017-12-14 ENCOUNTER — Telehealth: Payer: Self-pay

## 2017-12-14 NOTE — Telephone Encounter (Signed)
Returned pt call regarding her lab results. Informed her of her ferratin level. She will be here tomorrow for phlebotomy.  Cyndia Bent RN

## 2017-12-15 ENCOUNTER — Inpatient Hospital Stay: Payer: Medicare Other

## 2017-12-15 DIAGNOSIS — R948 Abnormal results of function studies of other organs and systems: Secondary | ICD-10-CM | POA: Diagnosis not present

## 2017-12-15 DIAGNOSIS — Z79899 Other long term (current) drug therapy: Secondary | ICD-10-CM | POA: Diagnosis not present

## 2017-12-15 NOTE — Patient Instructions (Signed)

## 2017-12-15 NOTE — Progress Notes (Signed)
[name] presents today for phlebotomy per MD orders. Phlebotomy procedure started at 0950 and ended at 0955 via right AC with phlebotomy needle.  530 cc removed. Patient tolerated procedure well. IV needle removed intact.  VSS, pt refused to stay full 30 minutes post and discharged home.

## 2017-12-24 DIAGNOSIS — E119 Type 2 diabetes mellitus without complications: Secondary | ICD-10-CM | POA: Diagnosis not present

## 2018-01-03 ENCOUNTER — Inpatient Hospital Stay: Payer: Medicare Other | Attending: Hematology and Oncology

## 2018-01-03 NOTE — Patient Instructions (Signed)

## 2018-01-14 ENCOUNTER — Other Ambulatory Visit: Payer: Self-pay

## 2018-01-17 ENCOUNTER — Inpatient Hospital Stay: Payer: Medicare Other

## 2018-01-17 ENCOUNTER — Inpatient Hospital Stay: Payer: Medicare Other | Attending: Hematology and Oncology

## 2018-01-17 DIAGNOSIS — R7989 Other specified abnormal findings of blood chemistry: Secondary | ICD-10-CM | POA: Diagnosis not present

## 2018-01-17 DIAGNOSIS — Z79899 Other long term (current) drug therapy: Secondary | ICD-10-CM | POA: Insufficient documentation

## 2018-01-17 LAB — CBC WITH DIFFERENTIAL (CANCER CENTER ONLY)
BASOS PCT: 1 %
Basophils Absolute: 0 10*3/uL (ref 0.0–0.1)
Eosinophils Absolute: 0.1 10*3/uL (ref 0.0–0.5)
Eosinophils Relative: 2 %
HEMATOCRIT: 40 % (ref 34.8–46.6)
HEMOGLOBIN: 13.7 g/dL (ref 11.6–15.9)
LYMPHS ABS: 0.7 10*3/uL — AB (ref 0.9–3.3)
LYMPHS PCT: 18 %
MCH: 33.3 pg (ref 25.1–34.0)
MCHC: 34.3 g/dL (ref 31.5–36.0)
MCV: 97.3 fL (ref 79.5–101.0)
MONO ABS: 0.3 10*3/uL (ref 0.1–0.9)
Monocytes Relative: 9 %
NEUTROS ABS: 2.6 10*3/uL (ref 1.5–6.5)
NEUTROS PCT: 70 %
Platelet Count: 114 10*3/uL — ABNORMAL LOW (ref 145–400)
RBC: 4.11 MIL/uL (ref 3.70–5.45)
RDW: 12.8 % (ref 11.2–14.5)
WBC Count: 3.7 10*3/uL — ABNORMAL LOW (ref 3.9–10.3)

## 2018-01-17 LAB — IRON AND TIBC
Iron: 83 ug/dL (ref 41–142)
Saturation Ratios: 22 % (ref 21–57)
TIBC: 374 ug/dL (ref 236–444)
UIBC: 291 ug/dL

## 2018-01-17 LAB — FERRITIN: FERRITIN: 124 ng/mL (ref 9–269)

## 2018-01-17 NOTE — Patient Instructions (Signed)

## 2018-01-30 NOTE — Assessment & Plan Note (Signed)
Elevated liver function tests with elevated ferritin Ferritin 08/2017: 918.9, iron saturation 43% AST 122, ALT 132 Hemoglobin 15 platelets 125  Plan: Phlebotomy to remove iron weekly for 4 weeks completed 12/07/17 Now we plan to do it q 2 weeks   Goal: Ferritin level below 100  RTC in 8 weeks with labs to assess ferritin.

## 2018-01-31 ENCOUNTER — Inpatient Hospital Stay: Payer: Medicare Other

## 2018-01-31 ENCOUNTER — Telehealth: Payer: Self-pay | Admitting: Hematology and Oncology

## 2018-01-31 ENCOUNTER — Inpatient Hospital Stay (HOSPITAL_BASED_OUTPATIENT_CLINIC_OR_DEPARTMENT_OTHER): Payer: Medicare Other | Admitting: Hematology and Oncology

## 2018-01-31 DIAGNOSIS — R7989 Other specified abnormal findings of blood chemistry: Secondary | ICD-10-CM

## 2018-01-31 DIAGNOSIS — Z79899 Other long term (current) drug therapy: Secondary | ICD-10-CM

## 2018-01-31 NOTE — Telephone Encounter (Signed)
Gave avs and calendar ° °

## 2018-01-31 NOTE — Patient Instructions (Signed)

## 2018-01-31 NOTE — Progress Notes (Signed)
Patient Care Team: Lawerance Cruel, MD as PCP - General (Family Medicine)  DIAGNOSIS:  Encounter Diagnosis  Name Primary?  . Hereditary hemochromatosis ([place])     CHIEF COMPLIANT: Follow-up of hereditary hemochromatosis on phlebotomy  INTERVAL HISTORY: [name] is a 67 year old with above-mentioned history of hereditary hemochromatosis who is currently on phlebotomy treatments.  She initially received it weekly now she is getting it every 2 weeks.  She is here for a blood count check and follow-up.  Overall she is tolerating phlebotomy reasonably well.  Most recent labs done on 01/17/2018 had a ferritin of 124 with a iron saturation of 22%.  REVIEW OF SYSTEMS:   Constitutional: Denies fevers, chills or abnormal weight loss Eyes: Denies blurriness of vision Ears, nose, mouth, throat, and face: Denies mucositis or sore throat Respiratory: Denies cough, dyspnea or wheezes Cardiovascular: Denies palpitation, chest discomfort Gastrointestinal:  Denies nausea, heartburn or change in bowel habits Skin: Denies abnormal skin rashes Lymphatics: Denies new lymphadenopathy or easy bruising Neurological:Denies numbness, tingling or new weaknesses Behavioral/Psych: Mood is stable, no new changes  Extremities: No lower extremity edema  All other systems were reviewed with the patient and are negative.  I have reviewed the past medical history, past surgical history, social history and family history with the patient and they are unchanged from previous note.  ALLERGIES:  is allergic to codeine.  MEDICATIONS:  Current Outpatient Medications  Medication Sig Dispense Refill  . amLODipine (NORVASC) 10 MG tablet Take 10 mg by mouth daily.    . cholecalciferol (VITAMIN D) 1000 units tablet Take 5,000 Units by mouth daily.    Marland Kitchen losartan (COZAAR) 100 MG tablet Take 100 mg by mouth daily.    . pravastatin (PRAVACHOL) 40 MG tablet Take 40 mg by mouth daily.    . sertraline (ZOLOFT) 50 MG tablet  Take 50 mg by mouth daily.     No current facility-administered medications for this visit.     PHYSICAL EXAMINATION: ECOG PERFORMANCE STATUS: 1 - Symptomatic but completely ambulatory  Vitals:   01/31/18 1143  BP: 134/76  Pulse: 82  Resp: 17  Temp: 97.9 F (36.6 C)  SpO2: 96%   Filed Weights   01/31/18 1143  Weight: 155 lb 11.2 oz (70.6 kg)    GENERAL:alert, no distress and comfortable SKIN: skin color, texture, turgor are normal, no rashes or significant lesions EYES: normal, Conjunctiva are pink and non-injected, sclera clear OROPHARYNX:no exudate, no erythema and lips, buccal mucosa, and tongue normal  NECK: supple, thyroid normal size, non-tender, without nodularity LYMPH:  no palpable lymphadenopathy in the cervical, axillary or inguinal LUNGS: clear to auscultation and percussion with normal breathing effort HEART: regular rate & rhythm and no murmurs and no lower extremity edema ABDOMEN:abdomen soft, non-tender and normal bowel sounds MUSCULOSKELETAL:no cyanosis of digits and no clubbing  NEURO: alert & oriented x 3 with fluent speech, no focal motor/sensory deficits EXTREMITIES: No lower extremity edema  LABORATORY DATA:  I have reviewed the data as listed CMP Latest Ref Rng & Units 07/01/2013 06/07/2011 06/06/2011  Glucose 70 - 99 mg/dL 128(H) 111(H) 113(H)  BUN 6 - 23 mg/dL 13 13 16   Creatinine 0.50 - 1.10 mg/dL 0.57 <0.47(L) <0.47(L)  Sodium 135 - 145 mEq/L 143 139 140  Potassium 3.5 - 5.1 mEq/L 3.8 3.4(L) 3.6  Chloride 96 - 112 mEq/L 103 102 101  CO2 19 - 32 mEq/L 26 28 29   Calcium 8.4 - 10.5 mg/dL 8.7 9.3 10.1  Total  Protein 6.0 - 8.3 g/dL 6.8 - -  Total Bilirubin 0.3 - 1.2 mg/dL 0.4 - -  Alkaline Phos 39 - 117 U/L 71 - -  AST 0 - 37 U/L 47(H) - -  ALT 0 - 35 U/L 59(H) - -    Lab Results  Component Value Date   WBC 3.7 (L) 01/17/2018   HGB 15.5 (H) 07/01/2013   HCT 40.0 01/17/2018   MCV 97.3 01/17/2018   PLT 114 (L) 01/17/2018   NEUTROABS 2.6  01/17/2018    ASSESSMENT & PLAN:  Hereditary hemochromatosis ([place]) Elevated liver function tests with elevated ferritin 08/2017 Ferritin : 918.9, iron saturation 43% 01/17/2018 ferritin 124, iron saturation 22%   Hemoglobin 13.7 and platelet count 114  Phlebotomy summary: 1.  Phlebotomy weekly for 4 weeks completed 12/07/17 2. Phlebotomy every 2 weeks x4 completed 01/31/2018 3.  Starting monthly phlebotomies    Goal: Ferritin level below 100  RTC in 4 months for follow-up with labs done with the third monthly phlebotomy treatment   Orders Placed This Encounter  Procedures  . Iron and TIBC    Standing Status:   Future    Standing Expiration Date:   01/31/2019  . Ferritin    Standing Status:   Future    Standing Expiration Date:   01/31/2019  . CBC with Differential (Cancer Center Only)    Standing Status:   Future    Standing Expiration Date:   02/01/2019   The patient has a good understanding of the overall plan. she agrees with it. she will call with any problems that may develop before the next visit here.   Harriette Ohara, MD 01/31/18

## 2018-03-02 ENCOUNTER — Inpatient Hospital Stay: Payer: Medicare Other | Attending: Hematology and Oncology

## 2018-03-02 NOTE — Progress Notes (Signed)
Pt presents today for phlebotomy per MD Gudena orders. Phlebotomy procedure started at 1248 and ended at 1255. 580 grams removed. Patient observed for 15 minutes after procedure without any incident. Patient tolerated procedure well, VS stable. 16 G RAC IV needle removed intact.

## 2018-03-02 NOTE — Patient Instructions (Signed)

## 2018-03-28 DIAGNOSIS — E119 Type 2 diabetes mellitus without complications: Secondary | ICD-10-CM | POA: Diagnosis not present

## 2018-04-01 ENCOUNTER — Inpatient Hospital Stay: Payer: Medicare Other

## 2018-04-04 ENCOUNTER — Telehealth: Payer: Self-pay | Admitting: Hematology and Oncology

## 2018-04-04 NOTE — Telephone Encounter (Signed)
Scheduled appt per 6/14 sch message  - pt is aware of appt date and time   

## 2018-05-02 ENCOUNTER — Other Ambulatory Visit: Payer: Medicare Other

## 2018-05-10 ENCOUNTER — Encounter (INDEPENDENT_AMBULATORY_CARE_PROVIDER_SITE_OTHER): Payer: Self-pay | Admitting: Orthopedic Surgery

## 2018-05-10 ENCOUNTER — Ambulatory Visit (INDEPENDENT_AMBULATORY_CARE_PROVIDER_SITE_OTHER): Payer: Medicare Other

## 2018-05-10 ENCOUNTER — Ambulatory Visit (INDEPENDENT_AMBULATORY_CARE_PROVIDER_SITE_OTHER): Payer: Medicare Other | Admitting: Orthopedic Surgery

## 2018-05-10 VITALS — Ht 61.0 in | Wt 156.0 lb

## 2018-05-10 DIAGNOSIS — M898X5 Other specified disorders of bone, thigh: Secondary | ICD-10-CM

## 2018-05-10 DIAGNOSIS — M62551 Muscle wasting and atrophy, not elsewhere classified, right thigh: Secondary | ICD-10-CM | POA: Diagnosis not present

## 2018-05-10 NOTE — Progress Notes (Signed)
Office Visit Note   Patient: [name]           Date of Birth: January 22, 1951           MRN: [id number] Visit Date: 05/10/2018              Requested by: Lawerance Cruel, [place], [place] [address] PCP: Lawerance Cruel, MD  Chief Complaint  Patient presents with  . Right Leg - Pain  . Right Hand - Pain      HPI: Patient is a 67 year old woman who presents complaining of right thigh knee and patella pain.  She is 5 years status post open reduction internal fixation of the right distal femur.  Patient also complains of pain and swelling in the DIP joints of both hands.  She denies any locking of her knee.  She states that her thigh muscle is much smaller on the right than the left.  Assessment & Plan: Visit Diagnoses:  1. Pain in right femur   2. Atrophy of muscle of right thigh     Plan: Patient was given instructions for quad strengthening.  Discussed this is important for proper tracking of the patella.  Follow-Up Instructions: Return if symptoms worsen or fail to improve.   Ortho Exam  Patient is alert, oriented, no adenopathy, well-dressed, normal affect, normal respiratory effort. Examination she has significant quad atrophy on the right.  There is no effusion of the right knee collaterals and cruciates are stable she does have tenderness to palpation in the patellofemoral joint with lateral tracking of the patella.  Examination of her hands she has swelling of the DIP joints of her fingers with decreased range of motion consistent with osteoarthritis.  Imaging: Xr Femur, Min 2 Views Right  Result Date: 05/10/2018 2 view radiographs of the right femur shows a congruent knee joint the hardware is stable and intact no hardware loosening the femur is well-healed no lytic changes.  No images are attached to the encounter.  Labs: Lab Results  Component Value Date   ESRSEDRATE 101 (H) 06/06/2011   ESRSEDRATE 49 (H) 06/04/2011   LABURIC 3.7  06/04/2011   REPTSTATUS 07/02/2013 FINAL 07/01/2013   GRAMSTAIN  06/06/2011    NO ORGANISMS SEEN RARE WBC PRESENT, PREDOMINANTLY PMN Gram Stain Report Called to,Read Back By and Verified With: DR. Veverly Fells AT 1912 ON [id number] BY LOVE,T.   CULT NO GROWTH Performed at [facility] 07/01/2013   [place] 06/06/2011     Lab Results  Component Value Date   ALBUMIN 4.0 07/01/2013   LABURIC 3.7 06/04/2011    Body mass index is 29.48 kg/m.  Orders:  Orders Placed This Encounter  Procedures  . XR FEMUR, MIN 2 VIEWS RIGHT   No orders of the defined types were placed in this encounter.    Procedures: No procedures performed  Clinical Data: No additional findings.  ROS:  All other systems negative, except as noted in the HPI. Review of Systems  Objective: Vital Signs: Ht 5\' 1"  (1.549 m)   Wt 156 lb (70.8 kg)   BMI 29.48 kg/m   Specialty Comments:  No specialty comments available.  PMFS History: Patient Active Problem List   Diagnosis Date Noted  . Hereditary hemochromatosis ([place]) 11/03/2017  . Ankle pain, chronic, left 03/15/2012   Past Medical History:  Diagnosis Date  . Diabetes mellitus without complication ([place])   . Hypertension   . Vitamin D deficiency 08/2017  History reviewed. No pertinent family history.  Past Surgical History:  Procedure Laterality Date  . FRACTURE SURGERY    . I&D EXTREMITY Right 07/01/2013   Procedure: IRRIGATION AND DEBRIDEMENT RIGHT LEG;  Surgeon: Newt Minion, MD;  Location: [facility];  Service: Orthopedics;  Laterality: Right;  . ORIF FEMUR FRACTURE Right 07/01/2013   Procedure: OPEN REDUCTION INTERNAL FIXATION (ORIF) DISTAL FEMUR FRACTURE;  Surgeon: Newt Minion, MD;  Location: [facility];  Service: Orthopedics;  Laterality: Right;   Social History   Occupational History  . Not on file  Tobacco Use  . Smoking status: Former Smoker    Packs/day: 1.00    Years: 44.00    Pack years: 44.00    Types:  Cigarettes  . Smokeless tobacco: Never Used  Substance and Sexual Activity  . Alcohol use: Not on file  . Drug use: Not on file  . Sexual activity: Not on file

## 2018-05-11 DIAGNOSIS — L821 Other seborrheic keratosis: Secondary | ICD-10-CM | POA: Diagnosis not present

## 2018-05-11 DIAGNOSIS — D229 Melanocytic nevi, unspecified: Secondary | ICD-10-CM | POA: Diagnosis not present

## 2018-05-30 ENCOUNTER — Inpatient Hospital Stay: Payer: Medicare Other | Attending: Hematology and Oncology

## 2018-05-30 DIAGNOSIS — R748 Abnormal levels of other serum enzymes: Secondary | ICD-10-CM | POA: Diagnosis not present

## 2018-05-30 DIAGNOSIS — Z79899 Other long term (current) drug therapy: Secondary | ICD-10-CM | POA: Insufficient documentation

## 2018-05-30 LAB — CBC WITH DIFFERENTIAL (CANCER CENTER ONLY)
Basophils Absolute: 0 10*3/uL (ref 0.0–0.1)
Basophils Relative: 0 %
EOS ABS: 0.2 10*3/uL (ref 0.0–0.5)
EOS PCT: 4 %
HEMATOCRIT: 41.3 % (ref 34.8–46.6)
Hemoglobin: 14.4 g/dL (ref 11.6–15.9)
Lymphocytes Relative: 22 %
Lymphs Abs: 1.1 10*3/uL (ref 0.9–3.3)
MCH: 33 pg (ref 25.1–34.0)
MCHC: 34.9 g/dL (ref 31.5–36.0)
MCV: 94.5 fL (ref 79.5–101.0)
MONO ABS: 0.3 10*3/uL (ref 0.1–0.9)
MONOS PCT: 7 %
NEUTROS ABS: 3.3 10*3/uL (ref 1.5–6.5)
Neutrophils Relative %: 67 %
PLATELETS: 93 10*3/uL — AB (ref 145–400)
RBC: 4.37 MIL/uL (ref 3.70–5.45)
RDW: 13.7 % (ref 11.2–14.5)
WBC Count: 4.9 10*3/uL (ref 3.9–10.3)

## 2018-05-30 LAB — IRON AND TIBC
IRON: 115 ug/dL (ref 41–142)
Saturation Ratios: 34 % (ref 21–57)
TIBC: 338 ug/dL (ref 236–444)
UIBC: 222 ug/dL

## 2018-05-30 LAB — FERRITIN: Ferritin: 108 ng/mL (ref 11–307)

## 2018-06-01 ENCOUNTER — Inpatient Hospital Stay (HOSPITAL_BASED_OUTPATIENT_CLINIC_OR_DEPARTMENT_OTHER): Payer: Medicare Other | Admitting: Hematology and Oncology

## 2018-06-01 ENCOUNTER — Inpatient Hospital Stay: Payer: Medicare Other

## 2018-06-01 ENCOUNTER — Telehealth: Payer: Self-pay | Admitting: Hematology and Oncology

## 2018-06-01 DIAGNOSIS — R748 Abnormal levels of other serum enzymes: Secondary | ICD-10-CM | POA: Diagnosis not present

## 2018-06-01 DIAGNOSIS — Z79899 Other long term (current) drug therapy: Secondary | ICD-10-CM

## 2018-06-01 NOTE — Assessment & Plan Note (Signed)
Elevated liver function tests with elevated ferritin 08/2017 Ferritin : 918.9, iron saturation 43% 01/17/2018 ferritin 124, iron saturation 22% 05/30/2018: Ferritin 108, iron saturation 34%  Phlebotomy summary: 1. Phlebotomy weeklyfor 4 weeks completed 12/07/17 2. Phlebotomy every 2 weeks x4 completed 01/31/2018 3. Current treatment: Monthly phlebotomies  Goal: Ferritin level below 100  RTC in 4 months for follow-up with labs done with the third monthly phlebotomy treatment

## 2018-06-01 NOTE — Progress Notes (Signed)
Patient Care Team: Lawerance Cruel, MD as PCP - General (Family Medicine)  DIAGNOSIS:  Encounter Diagnosis  Name Primary?  . Hereditary hemochromatosis ([place])    CHIEF COMPLIANT: Follow-up of hereditary hemochromatosis on monthly phlebotomies  INTERVAL HISTORY: [name] is a 31-year with above-mentioned history of hereditary hemochromatosis on monthly phlebotomies and appears to be tolerating it fairly well.  She has not had any major issues with the phlebotomy.  Denies any abdominal pain.  She complains of a sore throat today.  No fevers or chills.  REVIEW OF SYSTEMS:   Constitutional: Denies fevers, chills or abnormal weight loss Eyes: Denies blurriness of vision Ears, nose, mouth, throat, and face: Denies mucositis or sore throat Respiratory: Denies cough, dyspnea or wheezes Cardiovascular: Denies palpitation, chest discomfort Gastrointestinal:  Denies nausea, heartburn or change in bowel habits Skin: Denies abnormal skin rashes Lymphatics: Denies new lymphadenopathy or easy bruising Neurological:Denies numbness, tingling or new weaknesses Behavioral/Psych: Mood is stable, no new changes  Extremities: No lower extremity edema   All other systems were reviewed with the patient and are negative.  I have reviewed the past medical history, past surgical history, social history and family history with the patient and they are unchanged from previous note.  ALLERGIES:  is allergic to codeine.  MEDICATIONS:  Current Outpatient Medications  Medication Sig Dispense Refill  . amLODipine (NORVASC) 10 MG tablet Take 10 mg by mouth daily.    . cholecalciferol (VITAMIN D) 1000 units tablet Take 5,000 Units by mouth daily.    Marland Kitchen losartan (COZAAR) 100 MG tablet Take 100 mg by mouth daily.    . pravastatin (PRAVACHOL) 40 MG tablet Take 40 mg by mouth daily.    . sertraline (ZOLOFT) 50 MG tablet Take 50 mg by mouth daily.     No current facility-administered medications for this  visit.     PHYSICAL EXAMINATION: ECOG PERFORMANCE STATUS: 1 - Symptomatic but completely ambulatory  Vitals:   06/01/18 1118  BP: (!) 141/91  Pulse: 65  Resp: 17  Temp: 98.1 F (36.7 C)  SpO2: 98%   Filed Weights   06/01/18 1118  Weight: 156 lb 4.8 oz (70.9 kg)    GENERAL:alert, no distress and comfortable SKIN: skin color, texture, turgor are normal, no rashes or significant lesions EYES: normal, Conjunctiva are pink and non-injected, sclera clear OROPHARYNX:no exudate, no erythema and lips, buccal mucosa, and tongue normal  NECK: supple, thyroid normal size, non-tender, without nodularity LYMPH:  no palpable lymphadenopathy in the cervical, axillary or inguinal LUNGS: clear to auscultation and percussion with normal breathing effort HEART: regular rate & rhythm and no murmurs and no lower extremity edema ABDOMEN:abdomen soft, non-tender and normal bowel sounds MUSCULOSKELETAL:no cyanosis of digits and no clubbing  NEURO: alert & oriented x 3 with fluent speech, no focal motor/sensory deficits EXTREMITIES: No lower extremity edema   LABORATORY DATA:  I have reviewed the data as listed CMP Latest Ref Rng & Units 07/01/2013 06/07/2011 06/06/2011  Glucose 70 - 99 mg/dL 128(H) 111(H) 113(H)  BUN 6 - 23 mg/dL 13 13 16   Creatinine 0.50 - 1.10 mg/dL 0.57 <0.47(L) <0.47(L)  Sodium 135 - 145 mEq/L 143 139 140  Potassium 3.5 - 5.1 mEq/L 3.8 3.4(L) 3.6  Chloride 96 - 112 mEq/L 103 102 101  CO2 19 - 32 mEq/L 26 28 29   Calcium 8.4 - 10.5 mg/dL 8.7 9.3 10.1  Total Protein 6.0 - 8.3 g/dL 6.8 - -  Total Bilirubin 0.3 - 1.2  mg/dL 0.4 - -  Alkaline Phos 39 - 117 U/L 71 - -  AST 0 - 37 U/L 47(H) - -  ALT 0 - 35 U/L 59(H) - -    Lab Results  Component Value Date   WBC 4.9 05/30/2018   HGB 14.4 05/30/2018   HCT 41.3 05/30/2018   MCV 94.5 05/30/2018   PLT 93 (L) 05/30/2018   NEUTROABS 3.3 05/30/2018    ASSESSMENT & PLAN:  Hereditary hemochromatosis ([place]) Elevated liver function  tests with elevated ferritin 08/2017 Ferritin : 918.9, iron saturation 43% 01/17/2018 ferritin 124, iron saturation 22% 05/30/2018: Ferritin 108, iron saturation 34%  Phlebotomy summary: 1. Phlebotomy weeklyfor 4 weeks completed 12/07/17 2. Phlebotomy every 2 weeks x4 completed 01/31/2018 3. Current treatment: Monthly phlebotomies  Goal: Ferritin level below 100  RTC in 4 months for follow-up with labs done with the third monthly phlebotomy treatment    No orders of the defined types were placed in this encounter.  The patient has a good understanding of the overall plan. she agrees with it. she will call with any problems that may develop before the next visit here.   Harriette Ohara, MD 06/01/18

## 2018-06-01 NOTE — Patient Instructions (Signed)
Therapeutic Phlebotomy Therapeutic phlebotomy is the controlled removal of blood from a person's body for the purpose of treating a medical condition. The procedure is similar to donating blood. Usually, about a pint (470 mL, or 0.47L) of blood is removed. The average adult has 9-12 pints (4.3-5.7 L) of blood. Therapeutic phlebotomy may be used to treat the following medical conditions:  Hemochromatosis. This is a condition in which the blood contains too much iron.  Polycythemia vera. This is a condition in which the blood contains too many red blood cells.  Porphyria cutanea tarda. This is a disease in which an important part of hemoglobin is not made properly. It results in the buildup of abnormal amounts of porphyrins in the body.  Sickle cell disease. This is a condition in which the red blood cells form an abnormal crescent shape rather than a round shape.  Tell a health care provider about:  Any allergies you have.  All medicines you are taking, including vitamins, herbs, eye drops, creams, and over-the-counter medicines.  Any problems you or family members have had with anesthetic medicines.  Any blood disorders you have.  Any surgeries you have had.  Any medical conditions you have. What are the risks? Generally, this is a safe procedure. However, problems may occur, including:  Nausea or light-headedness.  Low blood pressure.  Soreness, bleeding, swelling, or bruising at the needle insertion site.  Infection.  What happens before the procedure?  Follow instructions from your health care provider about eating or drinking restrictions.  Ask your health care provider about changing or stopping your regular medicines. This is especially important if you are taking diabetes medicines or blood thinners.  Wear clothing with sleeves that can be raised above the elbow.  Plan to have someone take you home after the procedure.  You may have a blood sample taken. What  happens during the procedure?  A needle will be inserted into one of your veins.  Tubing and a collection bag will be attached to that needle.  Blood will flow through the needle and tubing into the collection bag.  You may be asked to open and close your hand slowly and continually during the entire collection.  After the specified amount of blood has been removed from your body, the collection bag and tubing will be clamped.  The needle will be removed from your vein.  Pressure will be held on the site of the needle insertion to stop the bleeding.  A bandage (dressing) will be placed over the needle insertion site. The procedure may vary among health care providers and hospitals. What happens after the procedure?  Your recovery will be assessed and monitored.  You can return to your normal activities as directed by your health care provider. This information is not intended to replace advice given to you by your health care provider. Make sure you discuss any questions you have with your health care provider. Document Released: 03/09/2011 Document Revised: 06/06/2016 Document Reviewed: 10/01/2014 Elsevier Interactive Patient Education  2018 Elsevier Inc.  

## 2018-06-01 NOTE — Telephone Encounter (Signed)
Gave patient avs and calendar.   °

## 2018-06-29 ENCOUNTER — Telehealth: Payer: Self-pay | Admitting: Hematology and Oncology

## 2018-06-29 NOTE — Telephone Encounter (Signed)
Appt r/s patient notified per 9/11 sch msg

## 2018-06-30 ENCOUNTER — Inpatient Hospital Stay: Payer: Medicare Other

## 2018-07-06 ENCOUNTER — Inpatient Hospital Stay: Payer: Medicare Other | Attending: Hematology and Oncology

## 2018-07-06 NOTE — Progress Notes (Signed)
Phlebotomy complete. Right AC accessed for a total of 510cc blood removal. Pt. Tolerated well. Vss. Snack given during treatment.

## 2018-07-06 NOTE — Patient Instructions (Signed)
Therapeutic Phlebotomy Therapeutic phlebotomy is the controlled removal of blood from a person's body for the purpose of treating a medical condition. The procedure is similar to donating blood. Usually, about a pint (470 mL, or 0.47L) of blood is removed. The average adult has 9-12 pints (4.3-5.7 L) of blood. Therapeutic phlebotomy may be used to treat the following medical conditions:  Hemochromatosis. This is a condition in which the blood contains too much iron.  Polycythemia vera. This is a condition in which the blood contains too many red blood cells.  Porphyria cutanea tarda. This is a disease in which an important part of hemoglobin is not made properly. It results in the buildup of abnormal amounts of porphyrins in the body.  Sickle cell disease. This is a condition in which the red blood cells form an abnormal crescent shape rather than a round shape.  Tell a health care provider about:  Any allergies you have.  All medicines you are taking, including vitamins, herbs, eye drops, creams, and over-the-counter medicines.  Any problems you or family members have had with anesthetic medicines.  Any blood disorders you have.  Any surgeries you have had.  Any medical conditions you have. What are the risks? Generally, this is a safe procedure. However, problems may occur, including:  Nausea or light-headedness.  Low blood pressure.  Soreness, bleeding, swelling, or bruising at the needle insertion site.  Infection.  What happens before the procedure?  Follow instructions from your health care provider about eating or drinking restrictions.  Ask your health care provider about changing or stopping your regular medicines. This is especially important if you are taking diabetes medicines or blood thinners.  Wear clothing with sleeves that can be raised above the elbow.  Plan to have someone take you home after the procedure.  You may have a blood sample taken. What  happens during the procedure?  A needle will be inserted into one of your veins.  Tubing and a collection bag will be attached to that needle.  Blood will flow through the needle and tubing into the collection bag.  You may be asked to open and close your hand slowly and continually during the entire collection.  After the specified amount of blood has been removed from your body, the collection bag and tubing will be clamped.  The needle will be removed from your vein.  Pressure will be held on the site of the needle insertion to stop the bleeding.  A bandage (dressing) will be placed over the needle insertion site. The procedure may vary among health care providers and hospitals. What happens after the procedure?  Your recovery will be assessed and monitored.  You can return to your normal activities as directed by your health care provider. This information is not intended to replace advice given to you by your health care provider. Make sure you discuss any questions you have with your health care provider. Document Released: 03/09/2011 Document Revised: 06/06/2016 Document Reviewed: 10/01/2014 Elsevier Interactive Patient Education  2018 Elsevier Inc.  

## 2018-07-27 ENCOUNTER — Inpatient Hospital Stay: Payer: Medicare Other

## 2018-07-27 ENCOUNTER — Telehealth: Payer: Self-pay | Admitting: Hematology and Oncology

## 2018-07-27 NOTE — Telephone Encounter (Signed)
Tried to call regarding 10/14 I will mail a letter she will need to reschedule 11/6 based on 10/14 If she keeps appointment

## 2018-08-01 ENCOUNTER — Inpatient Hospital Stay: Payer: Medicare Other | Attending: Hematology and Oncology

## 2018-08-01 NOTE — Progress Notes (Signed)
Pt. Here today for Phlebotomy per Dr. Lindi Adie orders RAC accessed with 18 gauge.Phelebotomy procedure started at 1151 am and ended 1202 pm 513 grams removed. Pt tolerated procedure well.Nutrition given to Pt. During procedure. Patient observed for 30 minutes after procedure, VS stable. 18 gauge needle removed intact.

## 2018-08-01 NOTE — Patient Instructions (Signed)

## 2018-08-17 DIAGNOSIS — Z23 Encounter for immunization: Secondary | ICD-10-CM | POA: Diagnosis not present

## 2018-08-24 ENCOUNTER — Inpatient Hospital Stay: Payer: Medicare Other | Attending: Hematology and Oncology

## 2018-08-24 NOTE — Progress Notes (Signed)
[name] presents today for phlebotomy per MD orders. Phlebotomy procedure started at 10:54 via 16 G phlebotomy kit and ended at 1059. 519 mLs removed. Patient observed for 30 minutes after procedure without any incident. Patient tolerated procedure well. IV needle removed intact.

## 2018-08-24 NOTE — Patient Instructions (Signed)

## 2018-08-24 NOTE — Progress Notes (Signed)
Pt observed for 20 minutes post phlebotomy. Pt provided with food and drink. VSS and no complaints from pt at end of observation period. Pt stated she could not stay full 30 minutes.

## 2018-08-26 ENCOUNTER — Other Ambulatory Visit: Payer: Self-pay | Admitting: Family Medicine

## 2018-08-26 DIAGNOSIS — Z1231 Encounter for screening mammogram for malignant neoplasm of breast: Secondary | ICD-10-CM

## 2018-09-01 DIAGNOSIS — L820 Inflamed seborrheic keratosis: Secondary | ICD-10-CM | POA: Diagnosis not present

## 2018-09-01 DIAGNOSIS — L720 Epidermal cyst: Secondary | ICD-10-CM | POA: Diagnosis not present

## 2018-09-01 DIAGNOSIS — D485 Neoplasm of uncertain behavior of skin: Secondary | ICD-10-CM | POA: Diagnosis not present

## 2018-09-01 DIAGNOSIS — B078 Other viral warts: Secondary | ICD-10-CM | POA: Diagnosis not present

## 2018-09-08 DIAGNOSIS — Z4802 Encounter for removal of sutures: Secondary | ICD-10-CM | POA: Diagnosis not present

## 2018-09-19 ENCOUNTER — Inpatient Hospital Stay: Admission: RE | Admit: 2018-09-19 | Payer: Medicare Other | Source: Ambulatory Visit

## 2018-09-19 ENCOUNTER — Telehealth: Payer: Self-pay

## 2018-09-19 NOTE — Progress Notes (Signed)
Patient Care Team: Lawerance Cruel, MD as PCP - General (Family Medicine)  DIAGNOSIS: Hereditary hemochromatosis [facility]) - Plan: Ferritin, Iron and TIBC, CBC with Differential ([facility])   CHIEF COMPLIANT: Follow-up of hereditary hemochromatosis on monthly phlebotomies  INTERVAL HISTORY: [name] is a 67 y.o. with above-mentioned history of hereditary hemochromatosis on monthly phlebotomies. She presents to the clinic today alone following her fourth monthly phlebotomy treatment. Her most recent labs from 09/20/18 show iron at 60, WBC 3.7, platelets at 96, ferritin at 24, and neutrophils ay 2.2. She notes she is doing well and feels better as a result of recent phlebotomy treatments. She reviewed her medication list with me.  REVIEW OF SYSTEMS:   Constitutional: Denies fevers, chills or abnormal weight loss Eyes: Denies blurriness of vision Ears, nose, mouth, throat, and face: Denies mucositis or sore throat Respiratory: Denies cough, dyspnea or wheezes Cardiovascular: Denies palpitation, chest discomfort Gastrointestinal:  Denies nausea, heartburn or change in bowel habits Skin: Denies abnormal skin rashes Lymphatics: Denies new lymphadenopathy or easy bruising Neurological:Denies numbness, tingling or new weaknesses Behavioral/Psych: Mood is stable, no new changes  Extremities: No lower extremity edema All other systems were reviewed with the patient and are negative.  I have reviewed the past medical history, past surgical history, social history and family history with the patient and they are unchanged from previous note.  ALLERGIES:  is allergic to codeine.  MEDICATIONS:  Current Outpatient Medications  Medication Sig Dispense Refill   amLODipine (NORVASC) 10 MG tablet Take 10 mg by mouth daily.     cholecalciferol (VITAMIN D) 1000 units tablet Take 5,000 Units by mouth daily.     losartan (COZAAR) 100 MG tablet Take 100 mg by mouth daily.     pravastatin  (PRAVACHOL) 40 MG tablet Take 40 mg by mouth daily.     sertraline (ZOLOFT) 50 MG tablet Take 50 mg by mouth daily.     No current facility-administered medications for this visit.     PHYSICAL EXAMINATION: ECOG PERFORMANCE STATUS: 1 - Symptomatic but completely ambulatory  Vitals:   09/21/18 0931  BP: 135/71  Pulse: 73  Resp: 17  Temp: 98.3 F (36.8 C)  SpO2: 97%   Filed Weights   09/21/18 0931  Weight: 150 lb 4.8 oz (68.2 kg)    GENERAL:alert, no distress and comfortable SKIN: skin color, texture, turgor are normal, no rashes or significant lesions EYES: normal, Conjunctiva are pink and non-injected, sclera clear OROPHARYNX:no exudate, no erythema and lips, buccal mucosa, and tongue normal  NECK: supple, thyroid normal size, non-tender, without nodularity LYMPH:  no palpable lymphadenopathy in the cervical, axillary or inguinal LUNGS: clear to auscultation and percussion with normal breathing effort HEART: regular rate & rhythm and no murmurs and no lower extremity edema ABDOMEN:abdomen soft, non-tender and normal bowel sounds MUSCULOSKELETAL:no cyanosis of digits and no clubbing  NEURO: alert & oriented x 3 with fluent speech, no focal motor/sensory deficits EXTREMITIES: No lower extremity edema  LABORATORY DATA:  I have reviewed the data as listed CMP Latest Ref Rng & Units 07/01/2013 06/07/2011 06/06/2011  Glucose 70 - 99 mg/dL 128(H) 111(H) 113(H)  BUN 6 - 23 mg/dL 13 13 16   Creatinine 0.50 - 1.10 mg/dL 0.57 <0.47(L) <0.47(L)  Sodium 135 - 145 mEq/L 143 139 140  Potassium 3.5 - 5.1 mEq/L 3.8 3.4(L) 3.6  Chloride 96 - 112 mEq/L 103 102 101  CO2 19 - 32 mEq/L 26 28 29   Calcium 8.4 -  10.5 mg/dL 8.7 9.3 10.1  Total Protein 6.0 - 8.3 g/dL 6.8 - -  Total Bilirubin 0.3 - 1.2 mg/dL 0.4 - -  Alkaline Phos 39 - 117 U/L 71 - -  AST 0 - 37 U/L 47(H) - -  ALT 0 - 35 U/L 59(H) - -    Lab Results  Component Value Date   WBC 3.7 (L) 09/20/2018   HGB 14.2 09/20/2018   HCT  41.1 09/20/2018   MCV 94.1 09/20/2018   PLT 96 (L) 09/20/2018   NEUTROABS 2.2 09/20/2018    ASSESSMENT & PLAN:  Hereditary hemochromatosis ([place]) Elevated liver function tests with elevated ferritin 08/2017 Ferritin : 918.9, iron saturation 43% 01/17/2018 ferritin 124, iron saturation 22% 05/30/2018: Ferritin 108, iron saturation 34%  Phlebotomy summary: 1.Phlebotomy weeklyfor 4 weeks completed 12/07/17 2.Phlebotomy every 2 weeks x4 completed 01/31/2018 3.Monthly phlebotomies until November 2019 4.  Current treatment Plan: Every 2 month Phlebotomies  Goal: Ferritin level below 100 Lab review: Ferritin 24, Iron Sat 14% We will cancel phlebotomy for today RTC in 6 months for follow-up with labs done 1 day ahead of the visit    Orders Placed This Encounter  Procedures   Ferritin    Standing Status:   Future    Standing Expiration Date:   09/21/2019   Iron and TIBC    Standing Status:   Future    Standing Expiration Date:   09/21/2019   CBC with Differential ([facility] Only)    Standing Status:   Future    Standing Expiration Date:   09/22/2019   The patient has a good understanding of the overall plan. she agrees with it. she will call with any problems that may develop before the next visit here.   Nicholas Lose, MD 09/21/2018   I, Molly Dorshimer, am acting as scribe for Nicholas Lose, MD.  I have reviewed the above documentation for accuracy and completeness, and I agree with the above.

## 2018-09-19 NOTE — Telephone Encounter (Signed)
Returned patient's call regarding rescheduling appointment for a later time.    Left message for patient to call back.

## 2018-09-20 ENCOUNTER — Telehealth: Payer: Self-pay | Admitting: Hematology and Oncology

## 2018-09-20 ENCOUNTER — Inpatient Hospital Stay: Payer: Medicare Other | Attending: Hematology and Oncology

## 2018-09-20 DIAGNOSIS — D696 Thrombocytopenia, unspecified: Secondary | ICD-10-CM | POA: Diagnosis not present

## 2018-09-20 DIAGNOSIS — I10 Essential (primary) hypertension: Secondary | ICD-10-CM | POA: Diagnosis not present

## 2018-09-20 DIAGNOSIS — Z79899 Other long term (current) drug therapy: Secondary | ICD-10-CM | POA: Diagnosis not present

## 2018-09-20 DIAGNOSIS — E7800 Pure hypercholesterolemia, unspecified: Secondary | ICD-10-CM | POA: Diagnosis not present

## 2018-09-20 DIAGNOSIS — E1169 Type 2 diabetes mellitus with other specified complication: Secondary | ICD-10-CM | POA: Diagnosis not present

## 2018-09-20 LAB — IRON AND TIBC
Iron: 60 ug/dL (ref 41–142)
Saturation Ratios: 14 % — ABNORMAL LOW (ref 21–57)
TIBC: 418 ug/dL (ref 236–444)
UIBC: 358 ug/dL (ref 120–384)

## 2018-09-20 LAB — CBC WITH DIFFERENTIAL (CANCER CENTER ONLY)
ABS IMMATURE GRANULOCYTES: 0.01 10*3/uL (ref 0.00–0.07)
BASOS PCT: 1 %
Basophils Absolute: 0 10*3/uL (ref 0.0–0.1)
Eosinophils Absolute: 0.2 10*3/uL (ref 0.0–0.5)
Eosinophils Relative: 5 %
HCT: 41.1 % (ref 36.0–46.0)
Hemoglobin: 14.2 g/dL (ref 12.0–15.0)
IMMATURE GRANULOCYTES: 0 %
LYMPHS ABS: 0.9 10*3/uL (ref 0.7–4.0)
Lymphocytes Relative: 24 %
MCH: 32.5 pg (ref 26.0–34.0)
MCHC: 34.5 g/dL (ref 30.0–36.0)
MCV: 94.1 fL (ref 80.0–100.0)
MONOS PCT: 10 %
Monocytes Absolute: 0.4 10*3/uL (ref 0.1–1.0)
NEUTROS ABS: 2.2 10*3/uL (ref 1.7–7.7)
NEUTROS PCT: 60 %
PLATELETS: 96 10*3/uL — AB (ref 150–400)
RBC: 4.37 MIL/uL (ref 3.87–5.11)
RDW: 12.2 % (ref 11.5–15.5)
WBC Count: 3.7 10*3/uL — ABNORMAL LOW (ref 4.0–10.5)
nRBC: 0 % (ref 0.0–0.2)

## 2018-09-20 LAB — FERRITIN: FERRITIN: 24 ng/mL (ref 11–307)

## 2018-09-20 NOTE — Telephone Encounter (Signed)
R/s phlebotomy appy per 12/2 sch message - pt is aware of time and date.

## 2018-09-20 NOTE — Assessment & Plan Note (Signed)
Elevated liver function tests with elevated ferritin 08/2017 Ferritin : 918.9, iron saturation 43% 01/17/2018 ferritin 124, iron saturation 22% 05/30/2018: Ferritin 108, iron saturation 34%  Phlebotomy summary: 1.Phlebotomy weeklyfor 4 weeks completed 12/07/17 2.Phlebotomy every 2 weeks x4 completed 01/31/2018 3.Monthly phlebotomies  4. Treatment Plan: Every 2 month Phlebotomies  Goal: Ferritin level below 100 Lab review: Ferritin 24, Iron Sat 14% RTC in 4 months for follow-up with labs done with the third monthly phlebotomy treatment

## 2018-09-21 ENCOUNTER — Inpatient Hospital Stay (HOSPITAL_BASED_OUTPATIENT_CLINIC_OR_DEPARTMENT_OTHER): Payer: Medicare Other | Admitting: Hematology and Oncology

## 2018-09-21 ENCOUNTER — Telehealth: Payer: Self-pay | Admitting: Hematology and Oncology

## 2018-09-21 ENCOUNTER — Inpatient Hospital Stay: Payer: Medicare Other

## 2018-09-21 DIAGNOSIS — Z79899 Other long term (current) drug therapy: Secondary | ICD-10-CM | POA: Diagnosis not present

## 2018-09-21 NOTE — Telephone Encounter (Signed)
Gave patient avs report and appointments for January thru June.

## 2018-09-22 ENCOUNTER — Ambulatory Visit (INDEPENDENT_AMBULATORY_CARE_PROVIDER_SITE_OTHER)
Admission: RE | Admit: 2018-09-22 | Discharge: 2018-09-22 | Disposition: A | Discharge status: Home / Self Care | Payer: Medicare Other | Source: Ambulatory Visit | Attending: Acute Care | Admitting: Acute Care

## 2018-09-22 DIAGNOSIS — Z122 Encounter for screening for malignant neoplasm of respiratory organs: Secondary | ICD-10-CM

## 2018-09-22 DIAGNOSIS — Z87891 Personal history of nicotine dependence: Secondary | ICD-10-CM

## 2018-09-29 ENCOUNTER — Other Ambulatory Visit: Payer: Self-pay | Admitting: Acute Care

## 2018-09-29 DIAGNOSIS — Z87891 Personal history of nicotine dependence: Secondary | ICD-10-CM

## 2018-09-29 DIAGNOSIS — Z122 Encounter for screening for malignant neoplasm of respiratory organs: Secondary | ICD-10-CM

## 2018-09-30 DIAGNOSIS — Z0000 Encounter for general adult medical examination without abnormal findings: Secondary | ICD-10-CM | POA: Diagnosis not present

## 2018-09-30 DIAGNOSIS — E1169 Type 2 diabetes mellitus with other specified complication: Secondary | ICD-10-CM | POA: Diagnosis not present

## 2018-09-30 DIAGNOSIS — D696 Thrombocytopenia, unspecified: Secondary | ICD-10-CM | POA: Diagnosis not present

## 2018-09-30 DIAGNOSIS — I10 Essential (primary) hypertension: Secondary | ICD-10-CM | POA: Diagnosis not present

## 2018-09-30 DIAGNOSIS — Z8639 Personal history of other endocrine, nutritional and metabolic disease: Secondary | ICD-10-CM | POA: Diagnosis not present

## 2018-09-30 DIAGNOSIS — E7800 Pure hypercholesterolemia, unspecified: Secondary | ICD-10-CM | POA: Diagnosis not present

## 2018-09-30 DIAGNOSIS — J449 Chronic obstructive pulmonary disease, unspecified: Secondary | ICD-10-CM | POA: Diagnosis not present

## 2018-09-30 DIAGNOSIS — M79606 Pain in leg, unspecified: Secondary | ICD-10-CM | POA: Diagnosis not present

## 2018-09-30 DIAGNOSIS — Z23 Encounter for immunization: Secondary | ICD-10-CM | POA: Diagnosis not present

## 2018-09-30 DIAGNOSIS — F419 Anxiety disorder, unspecified: Secondary | ICD-10-CM | POA: Diagnosis not present

## 2018-09-30 DIAGNOSIS — N3941 Urge incontinence: Secondary | ICD-10-CM | POA: Diagnosis not present

## 2018-10-05 DIAGNOSIS — D485 Neoplasm of uncertain behavior of skin: Secondary | ICD-10-CM | POA: Diagnosis not present

## 2018-10-05 DIAGNOSIS — L820 Inflamed seborrheic keratosis: Secondary | ICD-10-CM | POA: Diagnosis not present

## 2018-10-05 DIAGNOSIS — L720 Epidermal cyst: Secondary | ICD-10-CM | POA: Diagnosis not present

## 2018-10-06 ENCOUNTER — Ambulatory Visit
Admission: RE | Admit: 2018-10-06 | Discharge: 2018-10-06 | Disposition: A | Discharge status: Home / Self Care | Payer: Medicare Other | Source: Ambulatory Visit | Attending: Family Medicine | Admitting: Family Medicine

## 2018-10-06 ENCOUNTER — Other Ambulatory Visit: Payer: Self-pay | Admitting: Family Medicine

## 2018-10-06 DIAGNOSIS — E2839 Other primary ovarian failure: Secondary | ICD-10-CM

## 2018-10-06 DIAGNOSIS — Z1231 Encounter for screening mammogram for malignant neoplasm of breast: Secondary | ICD-10-CM

## 2018-10-14 ENCOUNTER — Telehealth: Payer: Self-pay

## 2018-10-14 NOTE — Telephone Encounter (Signed)
Called and spoke to [name] about her 10/20/2017 appointment with [place].  She was scheduled at 1000 and I moved her to 3pm to decompress the infusion suite due to the holiday.  She was perfectly OK with the change. [name];

## 2018-10-15 DIAGNOSIS — J029 Acute pharyngitis, unspecified: Secondary | ICD-10-CM | POA: Diagnosis not present

## 2018-10-20 ENCOUNTER — Inpatient Hospital Stay: Payer: Medicare Other | Attending: Hematology and Oncology

## 2018-10-20 NOTE — Progress Notes (Signed)
[name] presents today for phlebotomy per MD orders. RAC accessed with 18G needle and secondary set. Phlebotomy procedure started at 15:16 and ended at 15:30. 506 grams removed. IV needle removed intact. Patient tolerated procedure well. Patient declined to stay for 30 minutes after procedure. Snack and drink provided.  Vitals stable and patient ambulated out of clinic without incident.

## 2018-10-20 NOTE — Patient Instructions (Signed)

## 2018-10-20 NOTE — Progress Notes (Signed)
Infusion nurse requesting clarification if phlebotomy is needed today d/t previous one being canceled.    Nurse reviewed with Dr. Lindi Adie along with labs from previous apt - continue with phlebotomy today, no labs needed.  Future phlebotomies OK to proceed as well. Will recheck labs again as scheduled in June. Infusion nurse notified.

## 2018-11-28 ENCOUNTER — Ambulatory Visit
Admission: RE | Admit: 2018-11-28 | Discharge: 2018-11-28 | Disposition: A | Discharge status: Home / Self Care | Payer: Medicare Other | Source: Ambulatory Visit | Attending: Family Medicine | Admitting: Family Medicine

## 2018-11-28 DIAGNOSIS — E2839 Other primary ovarian failure: Secondary | ICD-10-CM

## 2018-11-28 DIAGNOSIS — Z780 Asymptomatic menopausal state: Secondary | ICD-10-CM | POA: Diagnosis not present

## 2018-11-28 DIAGNOSIS — M85851 Other specified disorders of bone density and structure, right thigh: Secondary | ICD-10-CM | POA: Diagnosis not present

## 2018-11-28 DIAGNOSIS — M85832 Other specified disorders of bone density and structure, left forearm: Secondary | ICD-10-CM | POA: Diagnosis not present

## 2018-12-15 ENCOUNTER — Inpatient Hospital Stay: Payer: Medicare Other | Attending: Hematology and Oncology

## 2018-12-15 NOTE — Progress Notes (Signed)
Phlebotomy performed per Dr. Lindi Adie  Order. Started at 10:14 with 18G. Patient tolerated well. Eating and drinking during phlebotomy. Phlebotomy ended at 10:26 with 512 g removed. Patient remaining for the 30 minute obs period continuing to drink fluids.

## 2018-12-22 DIAGNOSIS — Z8639 Personal history of other endocrine, nutritional and metabolic disease: Secondary | ICD-10-CM | POA: Diagnosis not present

## 2018-12-22 DIAGNOSIS — M810 Age-related osteoporosis without current pathological fracture: Secondary | ICD-10-CM | POA: Diagnosis not present

## 2019-01-30 DIAGNOSIS — L255 Unspecified contact dermatitis due to plants, except food: Secondary | ICD-10-CM | POA: Diagnosis not present

## 2019-01-30 DIAGNOSIS — L299 Pruritus, unspecified: Secondary | ICD-10-CM | POA: Diagnosis not present

## 2019-02-09 ENCOUNTER — Other Ambulatory Visit: Payer: Self-pay

## 2019-02-09 ENCOUNTER — Inpatient Hospital Stay: Payer: Medicare Other | Attending: Hematology and Oncology

## 2019-02-09 ENCOUNTER — Other Ambulatory Visit: Payer: Self-pay | Admitting: *Deleted

## 2019-02-09 ENCOUNTER — Other Ambulatory Visit: Payer: Self-pay | Admitting: Medical

## 2019-02-09 DIAGNOSIS — L237 Allergic contact dermatitis due to plants, except food: Secondary | ICD-10-CM

## 2019-02-09 MED ORDER — PREDNISONE 50 MG PO TABS: 25.0000 mg | ORAL_TABLET | Freq: Once | ORAL | Status: DC

## 2019-02-09 MED ORDER — PREDNISONE 5 MG PO TABS: ORAL_TABLET | ORAL | 0 refills | Status: DC

## 2019-02-09 MED ORDER — PREDNISONE 50 MG PO TABS
25.0000 mg | ORAL_TABLET | Freq: Once | ORAL | Status: AC
  Administered 2019-02-09: 25 mg via ORAL

## 2019-02-09 MED ORDER — PREDNISONE 50 MG PO TABS
ORAL_TABLET | ORAL | Status: AC
  Filled 2019-02-09: qty 1

## 2019-02-09 NOTE — Patient Instructions (Signed)

## 2019-02-09 NOTE — Progress Notes (Signed)
[name] presents today for phlebotomy per MD orders. Phlebotomy procedure started at 1100 and ended at 1104. 560 grams removed. Patient observed for 30 minutes after procedure without any incident. Patient tolerated procedure well.  VSS.  A&Ox4.  Ambulatory w/steady gait to exit with belongings. 16 G RAC IV needle removed intact.

## 2019-03-31 ENCOUNTER — Telehealth: Payer: Self-pay | Admitting: Hematology and Oncology

## 2019-03-31 NOTE — Telephone Encounter (Signed)
I left a message regarding video visit  °

## 2019-03-31 NOTE — Assessment & Plan Note (Signed)
Elevated liver function tests with elevated ferritin 08/2017 Ferritin : 918.9, iron saturation 43% 01/17/2018 ferritin 124, iron saturation 22% 05/30/2018: Ferritin 108, iron saturation 34% 09/21/2018: Ferritin 24, iron saturation 24%  Phlebotomy summary: 1.Phlebotomy weeklyfor 4 weeks completed 12/07/17 2.Phlebotomy every 2 weeks x4 completed 01/31/2018 3.Monthly phlebotomies until November 2019 4.  Current treatment Plan: Every 2 month Phlebotomies  Goal: Ferritin level below 100 Lab review:

## 2019-04-05 ENCOUNTER — Inpatient Hospital Stay: Payer: Medicare Other | Attending: Hematology and Oncology

## 2019-04-05 ENCOUNTER — Other Ambulatory Visit: Payer: Self-pay

## 2019-04-05 ENCOUNTER — Telehealth: Payer: Self-pay | Admitting: Hematology and Oncology

## 2019-04-05 LAB — CBC WITH DIFFERENTIAL (CANCER CENTER ONLY)
Abs Immature Granulocytes: 0.02 10*3/uL (ref 0.00–0.07)
Basophils Absolute: 0 10*3/uL (ref 0.0–0.1)
Basophils Relative: 1 %
Eosinophils Absolute: 0.1 10*3/uL (ref 0.0–0.5)
Eosinophils Relative: 3 %
HCT: 40.8 % (ref 36.0–46.0)
Hemoglobin: 14 g/dL (ref 12.0–15.0)
Immature Granulocytes: 1 %
Lymphocytes Relative: 17 %
Lymphs Abs: 0.7 10*3/uL (ref 0.7–4.0)
MCH: 33.1 pg (ref 26.0–34.0)
MCHC: 34.3 g/dL (ref 30.0–36.0)
MCV: 96.5 fL (ref 80.0–100.0)
Monocytes Absolute: 0.3 10*3/uL (ref 0.1–1.0)
Monocytes Relative: 7 %
Neutro Abs: 2.9 10*3/uL (ref 1.7–7.7)
Neutrophils Relative %: 71 %
Platelet Count: 97 10*3/uL — ABNORMAL LOW (ref 150–400)
RBC: 4.23 MIL/uL (ref 3.87–5.11)
RDW: 12.6 % (ref 11.5–15.5)
WBC Count: 4 10*3/uL (ref 4.0–10.5)
nRBC: 0 % (ref 0.0–0.2)

## 2019-04-05 LAB — IRON AND TIBC
Iron: 125 ug/dL (ref 41–142)
Saturation Ratios: 38 % (ref 21–57)
TIBC: 329 ug/dL (ref 236–444)
UIBC: 204 ug/dL (ref 120–384)

## 2019-04-05 LAB — FERRITIN: Ferritin: 180 ng/mL (ref 11–307)

## 2019-04-05 NOTE — Progress Notes (Signed)
HEMATOLOGY-ONCOLOGY MYCHART VIDEO VISIT PROGRESS NOTE  I connected with [name] on 04/06/2019 at 11:00 AM EDT by MyChart video conference and verified that I am speaking with the correct person using two identifiers.  I discussed the limitations, risks, security and privacy concerns of performing an evaluation and management service by MyChart and the availability of in person appointments.  I also discussed with the patient that there may be a patient responsible charge related to this service. The patient expressed understanding and agreed to proceed.  Patient's Location: Home Physician Location: Clinic  CHIEF COMPLIANT: Follow-up of hereditary hemochromatosis on monthly phlebotomies  INTERVAL HISTORY: [name] is a 68 y.o. female with above-mentioned history of hereditary hemochromatosis on phlebotomies every other month, most recently 02/09/19. Her most recent labs from 04/05/19 showed: WBC 4.0, Hg 14.0, HCT 40.8, platelets 97,000, iron saturation 38%, ferritin 180. She presents over MyChart today to review her recent labs.   REVIEW OF SYSTEMS:   Constitutional: Denies fevers, chills or abnormal weight loss Eyes: Denies blurriness of vision Ears, nose, mouth, throat, and face: Denies mucositis or sore throat Respiratory: Denies cough, dyspnea or wheezes Cardiovascular: Denies palpitation, chest discomfort Gastrointestinal:  Denies nausea, heartburn or change in bowel habits Skin: Denies abnormal skin rashes Lymphatics: Denies new lymphadenopathy or easy bruising Neurological:Denies numbness, tingling or new weaknesses Behavioral/Psych: Mood is stable, no new changes  Extremities: No lower extremity edema Breast: denies any pain or lumps or nodules in either breasts All other systems were reviewed with the patient and are negative.  Observations/Objective:  There were no vitals filed for this visit. There is no height or weight on file to calculate BMI.  I have  reviewed the data as listed CMP Latest Ref Rng & Units 07/01/2013 06/07/2011 06/06/2011  Glucose 70 - 99 mg/dL 128(H) 111(H) 113(H)  BUN 6 - 23 mg/dL 13 13 16   Creatinine 0.50 - 1.10 mg/dL 0.57 <0.47(L) <0.47(L)  Sodium 135 - 145 mEq/L 143 139 140  Potassium 3.5 - 5.1 mEq/L 3.8 3.4(L) 3.6  Chloride 96 - 112 mEq/L 103 102 101  CO2 19 - 32 mEq/L 26 28 29   Calcium 8.4 - 10.5 mg/dL 8.7 9.3 10.1  Total Protein 6.0 - 8.3 g/dL 6.8 - -  Total Bilirubin 0.3 - 1.2 mg/dL 0.4 - -  Alkaline Phos 39 - 117 U/L 71 - -  AST 0 - 37 U/L 47(H) - -  ALT 0 - 35 U/L 59(H) - -    Lab Results  Component Value Date   WBC 4.0 04/05/2019   HGB 14.0 04/05/2019   HCT 40.8 04/05/2019   MCV 96.5 04/05/2019   PLT 97 (L) 04/05/2019   NEUTROABS 2.9 04/05/2019      Assessment Plan:  Hereditary hemochromatosis ([place]) Elevated liver function tests with elevated ferritin 08/2017 Ferritin : 918.9, iron saturation 43% 01/17/2018 ferritin 124, iron saturation 22% 05/30/2018: Ferritin 108, iron saturation 34% 09/21/2018: Ferritin 24, iron saturation 24% 04/05/2019: Ferritin 180, iron saturation 38%  Phlebotomy summary: 1.Phlebotomy weeklyfor 4 weeks completed 12/07/17 2.Phlebotomy every 2 weeks x4 completed 01/31/2018 3.Monthly phlebotomies until November 2019, switched to every 42-month phlebotomies until 04/06/2019 Because of ferritin has gone up to 180 on the every 26-month phlebotomies, we are switching her to every 6-week phlebotomies. 4.  Current treatment Plan: Every 6-week phlebotomies  We also discussed the importance of oral hydration. Goal: Ferritin level below 100   I will see her back in 3 months with labs done ahead  of time with a MyChart video visit.  I discussed the assessment and treatment plan with the patient. The patient was provided an opportunity to ask questions and all were answered. The patient agreed with the plan and demonstrated an understanding of the instructions. The patient was advised  to call back or seek an in-person evaluation if the symptoms worsen or if the condition fails to improve as anticipated.   I provided 15 minutes of face-to-face MyChart video visit time during this encounter.    Rulon Eisenmenger, MD 04/06/2019   I, Molly Dorshimer, am acting as scribe for Nicholas Lose, MD.  I have reviewed the above documentation for accuracy and completeness, and I agree with the above.

## 2019-04-05 NOTE — Telephone Encounter (Signed)
Contacted pt to verify mychart video visit for pre reg

## 2019-04-06 ENCOUNTER — Other Ambulatory Visit: Payer: Self-pay

## 2019-04-06 ENCOUNTER — Inpatient Hospital Stay: Payer: Medicare Other

## 2019-04-06 ENCOUNTER — Inpatient Hospital Stay (HOSPITAL_BASED_OUTPATIENT_CLINIC_OR_DEPARTMENT_OTHER): Payer: Medicare Other | Admitting: Hematology and Oncology

## 2019-04-06 NOTE — Progress Notes (Signed)
Pt to receive therapeutic phlebotomy today per MD Gudena.  [name] presents today for phlebotomy per MD Gudena orders. Phlebotomy procedure started at 1202 and ended at 1209 . 550 grams removed. Patient observed for only 20 minutes after procedure without any incident.  VSS.  Ate and drank afterwards without any issue. Patient tolerated procedure well.  A&Ox4, ambulatory w/steady gait to exit. 16 G RAC IV needle removed intact.

## 2019-04-06 NOTE — Patient Instructions (Signed)

## 2019-04-11 ENCOUNTER — Telehealth: Payer: Self-pay | Admitting: Hematology and Oncology

## 2019-04-11 NOTE — Telephone Encounter (Signed)
I left a message regarding schedule  

## 2019-05-18 ENCOUNTER — Other Ambulatory Visit: Payer: Self-pay

## 2019-05-18 ENCOUNTER — Inpatient Hospital Stay: Payer: Medicare Other | Attending: Hematology and Oncology

## 2019-05-18 NOTE — Patient Instructions (Signed)

## 2019-05-18 NOTE — Progress Notes (Signed)
[name] presents today for phlebotomy per MD orders. Phlebotomy procedure started at 0927 with 16G in right AC and ended at 0931. 536 grams removed. Patient declined to stay for 30 minute port observation. Snack and drink provided, VSS. Patient tolerated procedure well.  IV needle removed intact.

## 2019-06-20 ENCOUNTER — Telehealth: Payer: Self-pay | Admitting: Hematology and Oncology

## 2019-06-20 NOTE — Telephone Encounter (Signed)
Called regarding infusion log, informed patient appointments on 09/10 times have been changed due to holiday. Left a voicemail.

## 2019-06-28 ENCOUNTER — Other Ambulatory Visit: Payer: Self-pay | Admitting: *Deleted

## 2019-06-29 ENCOUNTER — Other Ambulatory Visit: Payer: Medicare Other

## 2019-06-29 ENCOUNTER — Inpatient Hospital Stay: Payer: Medicare Other | Attending: Hematology and Oncology

## 2019-06-29 ENCOUNTER — Inpatient Hospital Stay: Payer: Medicare Other

## 2019-06-29 ENCOUNTER — Other Ambulatory Visit: Payer: Self-pay

## 2019-06-29 LAB — CBC WITH DIFFERENTIAL (CANCER CENTER ONLY)
Abs Immature Granulocytes: 0.01 10*3/uL (ref 0.00–0.07)
Basophils Absolute: 0 10*3/uL (ref 0.0–0.1)
Basophils Relative: 0 %
Eosinophils Absolute: 0.1 10*3/uL (ref 0.0–0.5)
Eosinophils Relative: 2 %
HCT: 40.2 % (ref 36.0–46.0)
Hemoglobin: 14.1 g/dL (ref 12.0–15.0)
Immature Granulocytes: 0 %
Lymphocytes Relative: 22 %
Lymphs Abs: 0.8 10*3/uL (ref 0.7–4.0)
MCH: 33.6 pg (ref 26.0–34.0)
MCHC: 35.1 g/dL (ref 30.0–36.0)
MCV: 95.7 fL (ref 80.0–100.0)
Monocytes Absolute: 0.2 10*3/uL (ref 0.1–1.0)
Monocytes Relative: 6 %
Neutro Abs: 2.5 10*3/uL (ref 1.7–7.7)
Neutrophils Relative %: 70 %
Platelet Count: 105 10*3/uL — ABNORMAL LOW (ref 150–400)
RBC: 4.2 MIL/uL (ref 3.87–5.11)
RDW: 12.4 % (ref 11.5–15.5)
WBC Count: 3.6 10*3/uL — ABNORMAL LOW (ref 4.0–10.5)
nRBC: 0 % (ref 0.0–0.2)

## 2019-06-29 LAB — IRON AND TIBC
Iron: 102 ug/dL (ref 41–142)
Saturation Ratios: 31 % (ref 21–57)
TIBC: 330 ug/dL (ref 236–444)
UIBC: 228 ug/dL (ref 120–384)

## 2019-06-29 LAB — FERRITIN: Ferritin: 169 ng/mL (ref 11–307)

## 2019-06-29 NOTE — Patient Instructions (Signed)

## 2019-06-29 NOTE — Progress Notes (Signed)
[name] presents today for phlebotomy per MD Gudena orders based on last set of labs on 04/05/2019. Phlebotomy procedure started at 1411 and ended at 1416. 550 grams removed. Patient observed for 30 minutes after procedure without any incident.  VSS.  Ambulatory w/steady gait to exit with belongings. Patient tolerated procedure well.  Ate and drank afterwards without any issues. 16 G RAC IV needle removed intact.

## 2019-06-29 NOTE — Progress Notes (Signed)
Pretty good.  Good morning to you house house everything going HEMATOLOGY-ONCOLOGY [place] NOTE  I connected with [name] on 06/30/2019 at  9:15 AM EDT by MyChart video conference and verified that I am speaking with the correct person using two identifiers.  I discussed the limitations, risks, security and privacy concerns of performing an evaluation and management service by MyChart and the availability of in person appointments.  I also discussed with the patient that there may be a patient responsible charge related to this service. The patient expressed understanding and agreed to proceed.  Patient's Location: Home Physician Location: Clinic  CHIEF COMPLIANT: Follow-up of hereditary hemochromatosis  INTERVAL HISTORY: [name] is a 68 y.o. female with above-mentioned history of hereditary hemochromatosis on phlebotomies every 6 weeks, most recently 06/29/19. Her most recent labs from 06/29/19 showed: WBC 3.6, Hg 14.1, HCT 40.2, platelets 105,000, iron saturation 31%, ferritin 169. She presents over MyChart today to review her recent labs. The your phlebotomies are day I day going REVIEW OF SYSTEMS:   Constitutional: Denies fevers, chills or abnormal weight loss Eyes: Denies blurriness of vision Ears, nose, mouth, throat, and face: Denies mucositis or sore throat Respiratory: Denies cough, dyspnea or wheezes Cardiovascular: Denies palpitation, chest discomfort Gastrointestinal:  Denies nausea, heartburn or change in bowel habits Skin: Denies abnormal skin rashes Lymphatics: Denies new lymphadenopathy or easy bruising Neurological:Denies numbness, tingling or new weaknesses Behavioral/Psych: Mood is stable, no new changes  Extremities: No lower extremity edema Breast: denies any pain or lumps or nodules in either breasts All other systems were reviewed with the patient and are negative.  Observations/Objective:  There were no vitals filed for this  visit. There is no height or weight on file to calculate BMI.  I have reviewed the data as listed CMP Latest Ref Rng & Units 07/01/2013 06/07/2011 06/06/2011  Glucose 70 - 99 mg/dL 128(H) 111(H) 113(H)  BUN 6 - 23 mg/dL 13 13 16   Creatinine 0.50 - 1.10 mg/dL 0.57 <0.47(L) <0.47(L)  Sodium 135 - 145 mEq/L 143 139 140  Potassium 3.5 - 5.1 mEq/L 3.8 3.4(L) 3.6  Chloride 96 - 112 mEq/L 103 102 101  CO2 19 - 32 mEq/L 26 28 29   Calcium 8.4 - 10.5 mg/dL 8.7 9.3 10.1  Total Protein 6.0 - 8.3 g/dL 6.8 - -  Total Bilirubin 0.3 - 1.2 mg/dL 0.4 - -  Alkaline Phos 39 - 117 U/L 71 - -  AST 0 - 37 U/L 47(H) - -  ALT 0 - 35 U/L 59(H) - -    Lab Results  Component Value Date   WBC 3.6 (L) 06/29/2019   HGB 14.1 06/29/2019   HCT 40.2 06/29/2019   MCV 95.7 06/29/2019   PLT 105 (L) 06/29/2019   NEUTROABS 2.5 06/29/2019      Assessment Plan:  Hereditary hemochromatosis ([place]) Elevated liver function tests with elevated ferritin 08/2017 Ferritin : 918.9, iron saturation 43% 01/17/2018 ferritin 124, iron saturation 22% 05/30/2018: Ferritin 108, iron saturation 34% 09/21/2018: Ferritin 24, iron saturation 24% 04/05/2019: Ferritin 180, iron saturation 38% 06/29/2019: Ferritin 169, iron saturation 31%  Phlebotomy summary: 1.Phlebotomy weeklyfor 4 weeks completed 12/07/17 2.Phlebotomy every 2 weeks x4 completed 01/31/2018 3.Monthly phlebotomiesuntil November 2019, switched to every 14-month phlebotomies until 04/06/2019 Because of ferritin has gone up to 180 on the every 62-month phlebotomies, we are switching her to every 6-week phlebotomies. 4.Current treatment Plan: Every 6-week phlebotomies  We also discussed the importance of oral hydration.  Goal: Ferritin level below 100 We will continue the current every 6-week phlebotomy plan.   I will see her back in 3 months with labs done on the day for phlebotomy with a MyChart video visit the following day.  I discussed the assessment and treatment  plan with the patient. The patient was provided an opportunity to ask questions and all were answered. The patient agreed with the plan and demonstrated an understanding of the instructions. The patient was advised to call back or seek an in-person evaluation if the symptoms worsen or if the condition fails to improve as anticipated.   I provided 15 minutes of face-to-face MyChart video visit time during this encounter.    Rulon Eisenmenger, MD 06/30/2019   I, Molly Dorshimer, am acting as scribe for Nicholas Lose, MD.  I have reviewed the above documentation for accuracy and completeness, and I agree with the above.

## 2019-06-30 ENCOUNTER — Inpatient Hospital Stay (HOSPITAL_BASED_OUTPATIENT_CLINIC_OR_DEPARTMENT_OTHER): Payer: Medicare Other | Admitting: Hematology and Oncology

## 2019-06-30 NOTE — Assessment & Plan Note (Signed)
Elevated liver function tests with elevated ferritin 08/2017 Ferritin : 918.9, iron saturation 43% 01/17/2018 ferritin 124, iron saturation 22% 05/30/2018: Ferritin 108, iron saturation 34% 09/21/2018: Ferritin 24, iron saturation 24% 04/05/2019: Ferritin 180, iron saturation 38% 06/29/2019: Ferritin 169, iron saturation 31%  Phlebotomy summary: 1.Phlebotomy weeklyfor 4 weeks completed 12/07/17 2.Phlebotomy every 2 weeks x4 completed 01/31/2018 3.Monthly phlebotomiesuntil November 2019, switched to every 21-month phlebotomies until 04/06/2019 Because of ferritin has gone up to 180 on the every 6-month phlebotomies, we are switching her to every 6-week phlebotomies. 4.Current treatment Plan: Every 6-week phlebotomies  We also discussed the importance of oral hydration. Goal: Ferritin level below 100 We will continue the current every 6-week phlebotomy plan.   I will see her back in 3 months with labs done ahead of time with a MyChart video visit.

## 2019-08-09 DIAGNOSIS — J45909 Unspecified asthma, uncomplicated: Secondary | ICD-10-CM | POA: Diagnosis not present

## 2019-08-09 DIAGNOSIS — M810 Age-related osteoporosis without current pathological fracture: Secondary | ICD-10-CM | POA: Diagnosis not present

## 2019-08-09 DIAGNOSIS — I10 Essential (primary) hypertension: Secondary | ICD-10-CM | POA: Diagnosis not present

## 2019-08-09 DIAGNOSIS — E7800 Pure hypercholesterolemia, unspecified: Secondary | ICD-10-CM | POA: Diagnosis not present

## 2019-08-09 DIAGNOSIS — J449 Chronic obstructive pulmonary disease, unspecified: Secondary | ICD-10-CM | POA: Diagnosis not present

## 2019-08-12 ENCOUNTER — Encounter: Payer: Self-pay | Admitting: Hematology and Oncology

## 2019-08-15 ENCOUNTER — Telehealth: Payer: Self-pay | Admitting: Hematology and Oncology

## 2019-08-15 NOTE — Telephone Encounter (Signed)
Scheduled appt per 10/27 sch message - unable to reach pt . Left message with appt date and time   

## 2019-08-18 ENCOUNTER — Inpatient Hospital Stay: Payer: Medicare Other | Attending: Hematology and Oncology

## 2019-08-18 ENCOUNTER — Other Ambulatory Visit: Payer: Self-pay

## 2019-08-18 VITALS — BP 147/87 | HR 86 | Temp 98.3°F | Resp 16

## 2019-08-18 DIAGNOSIS — Z23 Encounter for immunization: Secondary | ICD-10-CM | POA: Insufficient documentation

## 2019-08-18 MED ORDER — INFLUENZA VAC A&B SA ADJ QUAD 0.5 ML IM PRSY
PREFILLED_SYRINGE | INTRAMUSCULAR | Status: AC
  Filled 2019-08-18: qty 0.5

## 2019-08-18 MED ORDER — INFLUENZA VAC A&B SA ADJ QUAD 0.5 ML IM PRSY
0.5000 mL | PREFILLED_SYRINGE | Freq: Once | INTRAMUSCULAR | Status: AC
  Administered 2019-08-18: 0.5 mL via INTRAMUSCULAR

## 2019-08-18 NOTE — Patient Instructions (Signed)
Therapeutic Phlebotomy Therapeutic phlebotomy is the planned removal of blood from a person's body for the purpose of treating a medical condition. The procedure is similar to donating blood. Usually, about a pint (470 mL, or 0.47 L) of blood is removed. The average adult has 9-12 pints (4.3-5.7 L) of blood in the body. Therapeutic phlebotomy may be used to treat the following medical conditions:  Hemochromatosis. This is a condition in which the blood contains too much iron.  Polycythemia vera. This is a condition in which the blood contains too many red blood cells.  Porphyria cutanea tarda. This is a disease in which an important part of hemoglobin is not made properly. It results in the buildup of abnormal amounts of porphyrins in the body.  Sickle cell disease. This is a condition in which the red blood cells form an abnormal crescent shape rather than a round shape. Tell a health care provider about:  Any allergies you have.  All medicines you are taking, including vitamins, herbs, eye drops, creams, and over-the-counter medicines.  Any problems you or family members have had with anesthetic medicines.  Any blood disorders you have.  Any surgeries you have had.  Any medical conditions you have.  Whether you are pregnant or may be pregnant. What are the risks? Generally, this is a safe procedure. However, problems may occur, including:  Nausea or light-headedness.  Low blood pressure (hypotension).  Soreness, bleeding, swelling, or bruising at the needle insertion site.  Infection. What happens before the procedure?  Follow instructions from your health care provider about eating or drinking restrictions.  Ask your health care provider about: ? Changing or stopping your regular medicines. This is especially important if you are taking diabetes medicines or blood thinners (anticoagulants). ? Taking medicines such as aspirin and ibuprofen. These medicines can thin your  blood. Do not take these medicines unless your health care provider tells you to take them. ? Taking over-the-counter medicines, vitamins, herbs, and supplements.  Wear clothing with sleeves that can be raised above the elbow.  Plan to have someone take you home from the hospital or clinic.  You may have a blood sample taken.  Your blood pressure, pulse rate, and breathing rate will be measured. What happens during the procedure?   To lower your risk of infection: ? Your health care team will wash or sanitize their hands. ? Your skin will be cleaned with an antiseptic.  You may be given a medicine to numb the area (local anesthetic).  A tourniquet will be placed on your arm.  A needle will be inserted into one of your veins.  Tubing and a collection bag will be attached to that needle.  Blood will flow through the needle and tubing into the collection bag.  The collection bag will be placed lower than your arm to allow gravity to help the flow of blood into the bag.  You may be asked to open and close your hand slowly and continually during the entire collection.  After the specified amount of blood has been removed from your body, the collection bag and tubing will be clamped.  The needle will be removed from your vein.  Pressure will be held on the site of the needle insertion to stop the bleeding.  A bandage (dressing) will be placed over the needle insertion site. The procedure may vary among health care providers and hospitals. What happens after the procedure?  Your blood pressure, pulse rate, and breathing rate will be   measured after the procedure.  You will be encouraged to drink fluids.  Your recovery will be assessed and monitored.  You can return to your normal activities as told by your health care provider. Summary  Therapeutic phlebotomy is the planned removal of blood from a person's body for the purpose of treating a medical condition.  Therapeutic  phlebotomy may be used to treat hemochromatosis, polycythemia vera, porphyria cutanea tarda, or sickle cell disease.  In the procedure, a needle is inserted and about a pint (470 mL, or 0.47 L) of blood is removed. The average adult has 9-12 pints (4.3-5.7 L) of blood in the body.  This is generally a safe procedure, but it can sometimes cause problems such as nausea, light-headedness, or low blood pressure (hypotension). This information is not intended to replace advice given to you by your health care provider. Make sure you discuss any questions you have with your health care provider. Document Released: 03/09/2011 Document Revised: 10/21/2017 Document Reviewed: 10/21/2017 Elsevier Patient Education  2020 [employer]  

## 2019-09-29 ENCOUNTER — Other Ambulatory Visit: Payer: Self-pay | Admitting: *Deleted

## 2019-10-02 ENCOUNTER — Other Ambulatory Visit: Payer: Self-pay

## 2019-10-02 ENCOUNTER — Inpatient Hospital Stay: Payer: Medicare Other | Attending: Hematology and Oncology

## 2019-10-02 ENCOUNTER — Encounter: Payer: Self-pay | Admitting: Hematology and Oncology

## 2019-10-02 DIAGNOSIS — R7989 Other specified abnormal findings of blood chemistry: Secondary | ICD-10-CM | POA: Diagnosis not present

## 2019-10-02 DIAGNOSIS — Z79899 Other long term (current) drug therapy: Secondary | ICD-10-CM | POA: Insufficient documentation

## 2019-10-02 LAB — CBC WITH DIFFERENTIAL (CANCER CENTER ONLY)
Abs Immature Granulocytes: 0.01 10*3/uL (ref 0.00–0.07)
Basophils Absolute: 0 10*3/uL (ref 0.0–0.1)
Basophils Relative: 0 %
Eosinophils Absolute: 0.1 10*3/uL (ref 0.0–0.5)
Eosinophils Relative: 4 %
HCT: 41 % (ref 36.0–46.0)
Hemoglobin: 14.2 g/dL (ref 12.0–15.0)
Immature Granulocytes: 0 %
Lymphocytes Relative: 20 %
Lymphs Abs: 0.7 10*3/uL (ref 0.7–4.0)
MCH: 33.6 pg (ref 26.0–34.0)
MCHC: 34.6 g/dL (ref 30.0–36.0)
MCV: 96.9 fL (ref 80.0–100.0)
Monocytes Absolute: 0.3 10*3/uL (ref 0.1–1.0)
Monocytes Relative: 8 %
Neutro Abs: 2.5 10*3/uL (ref 1.7–7.7)
Neutrophils Relative %: 68 %
Platelet Count: 99 10*3/uL — ABNORMAL LOW (ref 150–400)
RBC: 4.23 MIL/uL (ref 3.87–5.11)
RDW: 12 % (ref 11.5–15.5)
WBC Count: 3.6 10*3/uL — ABNORMAL LOW (ref 4.0–10.5)
nRBC: 0 % (ref 0.0–0.2)

## 2019-10-02 LAB — IRON AND TIBC
Iron: 102 ug/dL (ref 41–142)
Saturation Ratios: 28 % (ref 21–57)
TIBC: 361 ug/dL (ref 236–444)
UIBC: 259 ug/dL (ref 120–384)

## 2019-10-02 LAB — FERRITIN: Ferritin: 102 ng/mL (ref 11–307)

## 2019-10-03 NOTE — Progress Notes (Signed)
Patient Care Team: Lawerance Cruel, MD as PCP - General (Family Medicine)  DIAGNOSIS:    ICD-10-CM   1. Hereditary hemochromatosis ([place])  E83.110     CHIEF COMPLIANT: Follow-up of hereditary hemochromatosis  INTERVAL HISTORY: [name] is a 68 y.o. with above-mentioned history of hereditary hemochromatosis on phlebotomiesevery 6 weeks, most recently 08/18/19. Her most recent labs from 10/02/19 showed: WBC 3.6, Hg 14.2, HCT 41.0, platelets 99, iron saturation 28%, ferritin 102. She presents to the clinic today to review her recent labs.  REVIEW OF SYSTEMS:   Constitutional: Denies fevers, chills or abnormal weight loss Eyes: Denies blurriness of vision Ears, nose, mouth, throat, and face: Denies mucositis or sore throat Respiratory: Denies cough, dyspnea or wheezes Cardiovascular: Denies palpitation, chest discomfort Gastrointestinal: Denies nausea, heartburn or change in bowel habits Skin: Denies abnormal skin rashes Lymphatics: Denies new lymphadenopathy or easy bruising Neurological: Denies numbness, tingling or new weaknesses Behavioral/Psych: Mood is stable, no new changes  Extremities: No lower extremity edema Breast: denies any pain or lumps or nodules in either breasts All other systems were reviewed with the patient and are negative.  I have reviewed the past medical history, past surgical history, social history and family history with the patient and they are unchanged from previous note.  ALLERGIES:  is allergic to codeine.  MEDICATIONS:  Current Outpatient Medications  Medication Sig Dispense Refill  . amLODipine (NORVASC) 10 MG tablet Take 10 mg by mouth daily.    . cholecalciferol (VITAMIN D) 1000 units tablet Take 5,000 Units by mouth daily.    Marland Kitchen losartan (COZAAR) 100 MG tablet Take 100 mg by mouth daily.    . pravastatin (PRAVACHOL) 40 MG tablet Take 40 mg by mouth daily.    . predniSONE (DELTASONE) 5 MG tablet 6 tab x 2 day, 5 tab x 2 day, 4 tab  x 2 day, 3 tab x 2 day, 2 tab x 2 day, 1 tab x 2 day, stop 21 tablet 0  . raloxifene (EVISTA) 60 MG tablet     . sertraline (ZOLOFT) 50 MG tablet Take 50 mg by mouth daily.     No current facility-administered medications for this visit.    PHYSICAL EXAMINATION: ECOG PERFORMANCE STATUS: 0 - Asymptomatic  Vitals:   10/04/19 1037  BP: 136/85  Pulse: 80  Resp: 18  Temp: 97.8 F (36.6 C)  SpO2: 97%   Filed Weights   10/04/19 1037  Weight: 155 lb 12.8 oz (70.7 kg)    GENERAL: alert, no distress and comfortable SKIN: skin color, texture, turgor are normal, no rashes or significant lesions EYES: normal, Conjunctiva are pink and non-injected, sclera clear OROPHARYNX: no exudate, no erythema and lips, buccal mucosa, and tongue normal  NECK: supple, thyroid normal size, non-tender, without nodularity LYMPH: no palpable lymphadenopathy in the cervical, axillary or inguinal LUNGS: clear to auscultation and percussion with normal breathing effort HEART: regular rate & rhythm and no murmurs and no lower extremity edema ABDOMEN: abdomen soft, non-tender and normal bowel sounds MUSCULOSKELETAL: no cyanosis of digits and no clubbing  NEURO: alert & oriented x 3 with fluent speech, no focal motor/sensory deficits EXTREMITIES: No lower extremity edema  LABORATORY DATA:  I have reviewed the data as listed CMP Latest Ref Rng & Units 07/01/2013 06/07/2011 06/06/2011  Glucose 70 - 99 mg/dL 128(H) 111(H) 113(H)  BUN 6 - 23 mg/dL 13 13 16   Creatinine 0.50 - 1.10 mg/dL 0.57 <0.47(L) <0.47(L)  Sodium 135 - 145 mEq/L  143 139 140  Potassium 3.5 - 5.1 mEq/L 3.8 3.4(L) 3.6  Chloride 96 - 112 mEq/L 103 102 101  CO2 19 - 32 mEq/L 26 28 29   Calcium 8.4 - 10.5 mg/dL 8.7 9.3 10.1  Total Protein 6.0 - 8.3 g/dL 6.8 - -  Total Bilirubin 0.3 - 1.2 mg/dL 0.4 - -  Alkaline Phos 39 - 117 U/L 71 - -  AST 0 - 37 U/L 47(H) - -  ALT 0 - 35 U/L 59(H) - -    Lab Results  Component Value Date   WBC 3.6 (L)  10/02/2019   HGB 14.2 10/02/2019   HCT 41.0 10/02/2019   MCV 96.9 10/02/2019   PLT 99 (L) 10/02/2019   NEUTROABS 2.5 10/02/2019    ASSESSMENT & PLAN:  Hereditary hemochromatosis ([place]) Elevated liver function tests with elevated ferritin 08/2017 Ferritin : 918.9, iron saturation 43% 01/17/2018 ferritin 124, iron saturation 22% 05/30/2018: Ferritin 108, iron saturation 34% 09/21/2018: Ferritin 24, iron saturation 24% 04/05/2019:Ferritin 180, iron saturation 38% 06/29/2019: Ferritin 169, iron saturation 31% 10/02/2019: Ferritin 102, iron saturation 28%  Phlebotomy summary: 1.Phlebotomy weeklyfor 4 weeks completed 12/07/17 2.Phlebotomy every 2 weeks x4 completed 01/31/2018 3.Monthly phlebotomiesuntil November 2019,switched to every 20-month phlebotomies until 04/06/2019 Because of ferritin has gone up to 180 on the every 44-month phlebotomies, we are switching her to every 6-week phlebotomies. 4.Current treatment : Continue with every6-week phlebotomies  Return to clinic every 6 weeks for phlebotomy and every 3 months for follow me with labs done ahead of time    No orders of the defined types were placed in this encounter.  The patient has a good understanding of the overall plan. she agrees with it. she will call with any problems that may develop before the next visit here.  Nicholas Lose, MD 10/04/2019  Julious Oka Dorshimer, am acting as scribe for Dr. Nicholas Lose.  I have reviewed the above document for accuracy and completeness, and I agree with the above.

## 2019-10-04 ENCOUNTER — Other Ambulatory Visit: Payer: Self-pay

## 2019-10-04 ENCOUNTER — Other Ambulatory Visit: Payer: Medicare Other

## 2019-10-04 ENCOUNTER — Inpatient Hospital Stay (HOSPITAL_BASED_OUTPATIENT_CLINIC_OR_DEPARTMENT_OTHER): Payer: Medicare Other | Admitting: Hematology and Oncology

## 2019-10-04 ENCOUNTER — Inpatient Hospital Stay: Payer: Medicare Other

## 2019-10-04 NOTE — Assessment & Plan Note (Signed)
Elevated liver function tests with elevated ferritin 08/2017 Ferritin : 918.9, iron saturation 43% 01/17/2018 ferritin 124, iron saturation 22% 05/30/2018: Ferritin 108, iron saturation 34% 09/21/2018: Ferritin 24, iron saturation 24% 04/05/2019:Ferritin 180, iron saturation 38% 06/29/2019: Ferritin 169, iron saturation 31% 10/02/2019: Ferritin 102, iron saturation 28%  Phlebotomy summary: 1.Phlebotomy weeklyfor 4 weeks completed 12/07/17 2.Phlebotomy every 2 weeks x4 completed 01/31/2018 3.Monthly phlebotomiesuntil November 2019,switched to every 54-month phlebotomies until 04/06/2019 Because of ferritin has gone up to 180 on the every 9-month phlebotomies, we are switching her to every 6-week phlebotomies. 4.Current treatment : Continue with every6-week phlebotomies  Return to clinic every 6 weeks for phlebotomy and every 3 months for follow me with labs done ahead of time

## 2019-10-04 NOTE — Patient Instructions (Signed)
Therapeutic Phlebotomy Therapeutic phlebotomy is the planned removal of blood from a person's body for the purpose of treating a medical condition. The procedure is similar to donating blood. Usually, about a pint (470 mL, or 0.47 L) of blood is removed. The average adult has 9-12 pints (4.3-5.7 L) of blood in the body. Therapeutic phlebotomy may be used to treat the following medical conditions:  Hemochromatosis. This is a condition in which the blood contains too much iron.  Polycythemia vera. This is a condition in which the blood contains too many red blood cells.  Porphyria cutanea tarda. This is a disease in which an important part of hemoglobin is not made properly. It results in the buildup of abnormal amounts of porphyrins in the body.  Sickle cell disease. This is a condition in which the red blood cells form an abnormal crescent shape rather than a round shape. Tell a health care provider about:  Any allergies you have.  All medicines you are taking, including vitamins, herbs, eye drops, creams, and over-the-counter medicines.  Any problems you or family members have had with anesthetic medicines.  Any blood disorders you have.  Any surgeries you have had.  Any medical conditions you have.  Whether you are pregnant or may be pregnant. What are the risks? Generally, this is a safe procedure. However, problems may occur, including:  Nausea or light-headedness.  Low blood pressure (hypotension).  Soreness, bleeding, swelling, or bruising at the needle insertion site.  Infection. What happens before the procedure?  Follow instructions from your health care provider about eating or drinking restrictions.  Ask your health care provider about: ? Changing or stopping your regular medicines. This is especially important if you are taking diabetes medicines or blood thinners (anticoagulants). ? Taking medicines such as aspirin and ibuprofen. These medicines can thin your  blood. Do not take these medicines unless your health care provider tells you to take them. ? Taking over-the-counter medicines, vitamins, herbs, and supplements.  Wear clothing with sleeves that can be raised above the elbow.  Plan to have someone take you home from the hospital or clinic.  You may have a blood sample taken.  Your blood pressure, pulse rate, and breathing rate will be measured. What happens during the procedure?   To lower your risk of infection: ? Your health care team will wash or sanitize their hands. ? Your skin will be cleaned with an antiseptic.  You may be given a medicine to numb the area (local anesthetic).  A tourniquet will be placed on your arm.  A needle will be inserted into one of your veins.  Tubing and a collection bag will be attached to that needle.  Blood will flow through the needle and tubing into the collection bag.  The collection bag will be placed lower than your arm to allow gravity to help the flow of blood into the bag.  You may be asked to open and close your hand slowly and continually during the entire collection.  After the specified amount of blood has been removed from your body, the collection bag and tubing will be clamped.  The needle will be removed from your vein.  Pressure will be held on the site of the needle insertion to stop the bleeding.  A bandage (dressing) will be placed over the needle insertion site. The procedure may vary among health care providers and hospitals. What happens after the procedure?  Your blood pressure, pulse rate, and breathing rate will be   measured after the procedure.  You will be encouraged to drink fluids.  Your recovery will be assessed and monitored.  You can return to your normal activities as told by your health care provider. Summary  Therapeutic phlebotomy is the planned removal of blood from a person's body for the purpose of treating a medical condition.  Therapeutic  phlebotomy may be used to treat hemochromatosis, polycythemia vera, porphyria cutanea tarda, or sickle cell disease.  In the procedure, a needle is inserted and about a pint (470 mL, or 0.47 L) of blood is removed. The average adult has 9-12 pints (4.3-5.7 L) of blood in the body.  This is generally a safe procedure, but it can sometimes cause problems such as nausea, light-headedness, or low blood pressure (hypotension). This information is not intended to replace advice given to you by your health care provider. Make sure you discuss any questions you have with your health care provider. Document Released: 03/09/2011 Document Revised: 10/21/2017 Document Reviewed: 10/21/2017 Elsevier Patient Education  2020 [employer]  

## 2019-10-04 NOTE — Progress Notes (Signed)
After confirming name and DOB, RN started phlebotomy procedure at 1126 and ended at 1130.  526 grams removed.  Patient tolerated well.  RN used 16 G RAC, need intact upon removal.  Pt given snack and beverages, currently on 30 min post observation.

## 2019-10-11 ENCOUNTER — Ambulatory Visit
Admission: RE | Admit: 2019-10-11 | Discharge: 2019-10-11 | Disposition: A | Discharge status: Home / Self Care | Payer: Medicare Other | Source: Ambulatory Visit | Attending: Acute Care | Admitting: Acute Care

## 2019-10-11 DIAGNOSIS — Z87891 Personal history of nicotine dependence: Secondary | ICD-10-CM

## 2019-10-11 DIAGNOSIS — Z122 Encounter for screening for malignant neoplasm of respiratory organs: Secondary | ICD-10-CM

## 2019-10-17 LAB — HEMOGLOBIN A1C: Hemoglobin A1C: 6.6

## 2019-10-19 ENCOUNTER — Other Ambulatory Visit: Payer: Self-pay | Admitting: *Deleted

## 2019-10-19 DIAGNOSIS — Z87891 Personal history of nicotine dependence: Secondary | ICD-10-CM

## 2019-11-09 ENCOUNTER — Ambulatory Visit: Payer: Medicare Other | Attending: Internal Medicine

## 2019-11-09 DIAGNOSIS — Z23 Encounter for immunization: Secondary | ICD-10-CM

## 2019-11-09 NOTE — Progress Notes (Signed)
   Covid-19 Vaccination Clinic  Name:  [name]    MRN: [id number] DOB: 06-May-1951  11/09/2019  [name] was observed post Covid-19 immunization for 30 minutes based on pre-vaccination screening without incidence. She was provided with Vaccine Information Sheet and instruction to access the V-Safe system.   [name] was instructed to call 911 with any severe reactions post vaccine: Marland Kitchen Difficulty breathing  . Swelling of your face and throat  . A fast heartbeat  . A bad rash all over your body  . Dizziness and weakness    Immunizations Administered    Name Date Dose VIS Date Route   Pfizer COVID-19 Vaccine 11/09/2019 11:14 AM 0.3 mL 09/29/2019 Intramuscular   Manufacturer: [place]   Lot: [id number]   [place]: [id number]

## 2019-11-15 ENCOUNTER — Other Ambulatory Visit: Payer: Self-pay

## 2019-11-15 ENCOUNTER — Inpatient Hospital Stay: Payer: Medicare Other | Attending: Hematology and Oncology

## 2019-11-15 NOTE — Patient Instructions (Signed)
Therapeutic Phlebotomy Therapeutic phlebotomy is the planned removal of blood from a person's body for the purpose of treating a medical condition. The procedure is similar to donating blood. Usually, about a pint (470 mL, or 0.47 L) of blood is removed. The average adult has 9-12 pints (4.3-5.7 L) of blood in the body. Therapeutic phlebotomy may be used to treat the following medical conditions:  Hemochromatosis. This is a condition in which the blood contains too much iron.  Polycythemia vera. This is a condition in which the blood contains too many red blood cells.  Porphyria cutanea tarda. This is a disease in which an important part of hemoglobin is not made properly. It results in the buildup of abnormal amounts of porphyrins in the body.  Sickle cell disease. This is a condition in which the red blood cells form an abnormal crescent shape rather than a round shape. Tell a health care provider about:  Any allergies you have.  All medicines you are taking, including vitamins, herbs, eye drops, creams, and over-the-counter medicines.  Any problems you or family members have had with anesthetic medicines.  Any blood disorders you have.  Any surgeries you have had.  Any medical conditions you have.  Whether you are pregnant or may be pregnant. What are the risks? Generally, this is a safe procedure. However, problems may occur, including:  Nausea or light-headedness.  Low blood pressure (hypotension).  Soreness, bleeding, swelling, or bruising at the needle insertion site.  Infection. What happens before the procedure?  Follow instructions from your health care provider about eating or drinking restrictions.  Ask your health care provider about: ? Changing or stopping your regular medicines. This is especially important if you are taking diabetes medicines or blood thinners (anticoagulants). ? Taking medicines such as aspirin and ibuprofen. These medicines can thin your  blood. Do not take these medicines unless your health care provider tells you to take them. ? Taking over-the-counter medicines, vitamins, herbs, and supplements.  Wear clothing with sleeves that can be raised above the elbow.  Plan to have someone take you home from the hospital or clinic.  You may have a blood sample taken.  Your blood pressure, pulse rate, and breathing rate will be measured. What happens during the procedure?   To lower your risk of infection: ? Your health care team will wash or sanitize their hands. ? Your skin will be cleaned with an antiseptic.  You may be given a medicine to numb the area (local anesthetic).  A tourniquet will be placed on your arm.  A needle will be inserted into one of your veins.  Tubing and a collection bag will be attached to that needle.  Blood will flow through the needle and tubing into the collection bag.  The collection bag will be placed lower than your arm to allow gravity to help the flow of blood into the bag.  You may be asked to open and close your hand slowly and continually during the entire collection.  After the specified amount of blood has been removed from your body, the collection bag and tubing will be clamped.  The needle will be removed from your vein.  Pressure will be held on the site of the needle insertion to stop the bleeding.  A bandage (dressing) will be placed over the needle insertion site. The procedure may vary among health care providers and hospitals. What happens after the procedure?  Your blood pressure, pulse rate, and breathing rate will be   measured after the procedure.  You will be encouraged to drink fluids.  Your recovery will be assessed and monitored.  You can return to your normal activities as told by your health care provider. Summary  Therapeutic phlebotomy is the planned removal of blood from a person's body for the purpose of treating a medical condition.  Therapeutic  phlebotomy may be used to treat hemochromatosis, polycythemia vera, porphyria cutanea tarda, or sickle cell disease.  In the procedure, a needle is inserted and about a pint (470 mL, or 0.47 L) of blood is removed. The average adult has 9-12 pints (4.3-5.7 L) of blood in the body.  This is generally a safe procedure, but it can sometimes cause problems such as nausea, light-headedness, or low blood pressure (hypotension). This information is not intended to replace advice given to you by your health care provider. Make sure you discuss any questions you have with your health care provider. Document Released: 03/09/2011 Document Revised: 10/21/2017 Document Reviewed: 10/21/2017 Elsevier Patient Education  2020 [employer]  

## 2019-11-21 ENCOUNTER — Other Ambulatory Visit: Payer: Self-pay | Admitting: Family Medicine

## 2019-11-21 DIAGNOSIS — Z1231 Encounter for screening mammogram for malignant neoplasm of breast: Secondary | ICD-10-CM

## 2019-11-29 DIAGNOSIS — I10 Essential (primary) hypertension: Secondary | ICD-10-CM | POA: Diagnosis not present

## 2019-11-29 DIAGNOSIS — J45909 Unspecified asthma, uncomplicated: Secondary | ICD-10-CM | POA: Diagnosis not present

## 2019-11-29 DIAGNOSIS — J449 Chronic obstructive pulmonary disease, unspecified: Secondary | ICD-10-CM | POA: Diagnosis not present

## 2019-11-29 DIAGNOSIS — M810 Age-related osteoporosis without current pathological fracture: Secondary | ICD-10-CM | POA: Diagnosis not present

## 2019-11-30 ENCOUNTER — Ambulatory Visit: Payer: Medicare Other | Attending: Internal Medicine

## 2019-11-30 DIAGNOSIS — Z23 Encounter for immunization: Secondary | ICD-10-CM

## 2019-12-25 ENCOUNTER — Other Ambulatory Visit: Payer: Self-pay

## 2019-12-25 ENCOUNTER — Ambulatory Visit
Admission: RE | Admit: 2019-12-25 | Discharge: 2019-12-25 | Disposition: A | Discharge status: Home / Self Care | Payer: No Typology Code available for payment source | Source: Ambulatory Visit | Attending: Family Medicine | Admitting: Family Medicine

## 2019-12-25 DIAGNOSIS — Z1231 Encounter for screening mammogram for malignant neoplasm of breast: Secondary | ICD-10-CM | POA: Diagnosis not present

## 2019-12-27 ENCOUNTER — Other Ambulatory Visit: Payer: Self-pay

## 2019-12-27 ENCOUNTER — Inpatient Hospital Stay: Payer: Medicare Other | Attending: Hematology and Oncology

## 2019-12-27 NOTE — Progress Notes (Signed)
[name] presents today for phlebotomy per MD orders. 16 g. Needle to R antecubital. Phlebotomy procedure started at 0944 and ended at 0949. 522 grams removed. Patient observed for 30 minutes after procedure without any incident. Patient tolerated procedure well. IV needle removed intact.  VS stable.

## 2019-12-27 NOTE — Patient Instructions (Signed)

## 2020-01-02 DIAGNOSIS — J45909 Unspecified asthma, uncomplicated: Secondary | ICD-10-CM | POA: Diagnosis not present

## 2020-01-02 DIAGNOSIS — I10 Essential (primary) hypertension: Secondary | ICD-10-CM | POA: Diagnosis not present

## 2020-01-02 DIAGNOSIS — M810 Age-related osteoporosis without current pathological fracture: Secondary | ICD-10-CM | POA: Diagnosis not present

## 2020-01-02 DIAGNOSIS — J449 Chronic obstructive pulmonary disease, unspecified: Secondary | ICD-10-CM | POA: Diagnosis not present

## 2020-02-05 ENCOUNTER — Inpatient Hospital Stay: Payer: Medicare Other | Attending: Hematology and Oncology

## 2020-02-05 ENCOUNTER — Other Ambulatory Visit: Payer: Self-pay

## 2020-02-05 DIAGNOSIS — Z79899 Other long term (current) drug therapy: Secondary | ICD-10-CM | POA: Diagnosis not present

## 2020-02-05 LAB — IRON AND TIBC
Iron: 75 ug/dL (ref 41–142)
Saturation Ratios: 20 % — ABNORMAL LOW (ref 21–57)
TIBC: 378 ug/dL (ref 236–444)
UIBC: 302 ug/dL (ref 120–384)

## 2020-02-05 LAB — CBC WITH DIFFERENTIAL (CANCER CENTER ONLY)
Abs Immature Granulocytes: 0.01 10*3/uL (ref 0.00–0.07)
Basophils Absolute: 0 10*3/uL (ref 0.0–0.1)
Basophils Relative: 1 %
Eosinophils Absolute: 0.1 10*3/uL (ref 0.0–0.5)
Eosinophils Relative: 3 %
HCT: 42.7 % (ref 36.0–46.0)
Hemoglobin: 14.9 g/dL (ref 12.0–15.0)
Immature Granulocytes: 0 %
Lymphocytes Relative: 19 %
Lymphs Abs: 0.7 10*3/uL (ref 0.7–4.0)
MCH: 32.5 pg (ref 26.0–34.0)
MCHC: 34.9 g/dL (ref 30.0–36.0)
MCV: 93.2 fL (ref 80.0–100.0)
Monocytes Absolute: 0.3 10*3/uL (ref 0.1–1.0)
Monocytes Relative: 7 %
Neutro Abs: 2.6 10*3/uL (ref 1.7–7.7)
Neutrophils Relative %: 70 %
Platelet Count: 96 10*3/uL — ABNORMAL LOW (ref 150–400)
RBC: 4.58 MIL/uL (ref 3.87–5.11)
RDW: 12.7 % (ref 11.5–15.5)
WBC Count: 3.7 10*3/uL — ABNORMAL LOW (ref 4.0–10.5)
nRBC: 0 % (ref 0.0–0.2)

## 2020-02-05 LAB — CMP (CANCER CENTER ONLY)
ALT: 89 U/L — ABNORMAL HIGH (ref 0–44)
AST: 70 U/L — ABNORMAL HIGH (ref 15–41)
Albumin: 4.2 g/dL (ref 3.5–5.0)
Alkaline Phosphatase: 122 U/L (ref 38–126)
Anion gap: 8 (ref 5–15)
BUN: 12 mg/dL (ref 8–23)
CO2: 26 mmol/L (ref 22–32)
Calcium: 9.9 mg/dL (ref 8.9–10.3)
Chloride: 102 mmol/L (ref 98–111)
Creatinine: 0.74 mg/dL (ref 0.44–1.00)
GFR, Est AFR Am: >60 mL/min (ref >60)
GFR, Estimated: >60 mL/min (ref >60)
Glucose, Bld: 203 mg/dL — ABNORMAL HIGH (ref 70–99)
Potassium: 4.4 mmol/L (ref 3.5–5.1)
Sodium: 136 mmol/L (ref 135–145)
Total Bilirubin: 0.7 mg/dL (ref 0.3–1.2)
Total Protein: 6.9 g/dL (ref 6.5–8.1)

## 2020-02-05 LAB — FERRITIN: Ferritin: 71 ng/mL (ref 11–307)

## 2020-02-06 NOTE — Progress Notes (Signed)
Patient Care Team: Lawerance Cruel, MD as PCP - General (Family Medicine)  DIAGNOSIS:    ICD-10-CM   1. Hereditary hemochromatosis ([place])  E83.110 CBC with Differential ([place])    CMP ([place] only)    Ferritin    Iron and TIBC    CHIEF COMPLIANT: Follow-up of hereditary hemochromatosis  INTERVAL HISTORY: [name] is a 69 y.o. with above-mentioned history of hereditary hemochromatosis on phlebotomiesevery6 weeks, most recently3/10/21. Her most recent labs from4/19/21 showed: WBC3.7, Hg 14.9, HCT 42.7, platelets96, iron saturation 20%, ferritin 71. She presents to the clinic today to review her recent labs.  She is tolerating she is tolerating her phlebotomies extremely well.  ALLERGIES:  is allergic to codeine.  MEDICATIONS:  Current Outpatient Medications  Medication Sig Dispense Refill  . amLODipine (NORVASC) 10 MG tablet Take 10 mg by mouth daily.    . cholecalciferol (VITAMIN D) 1000 units tablet Take 5,000 Units by mouth daily.    Marland Kitchen losartan (COZAAR) 100 MG tablet Take 100 mg by mouth daily.    . pravastatin (PRAVACHOL) 40 MG tablet Take 40 mg by mouth daily.    . raloxifene (EVISTA) 60 MG tablet     . sertraline (ZOLOFT) 50 MG tablet Take 50 mg by mouth daily.     No current facility-administered medications for this visit.    PHYSICAL EXAMINATION: ECOG PERFORMANCE STATUS: 1 - Symptomatic but completely ambulatory  Vitals:   02/07/20 0842  BP: (!) 141/75  Pulse: 80  Resp: 16  Temp: 98.3 F (36.8 C)  SpO2: 94%   Filed Weights   02/07/20 0842  Weight: 158 lb 1.6 oz (71.7 kg)    LABORATORY DATA:  I have reviewed the data as listed CMP Latest Ref Rng & Units 02/05/2020 07/01/2013 06/07/2011  Glucose 70 - 99 mg/dL 203(H) 128(H) 111(H)  BUN 8 - 23 mg/dL 12 13 13   Creatinine 0.44 - 1.00 mg/dL 0.74 0.57 <0.47(L)  Sodium 135 - 145 mmol/L 136 143 139  Potassium 3.5 - 5.1 mmol/L 4.4 3.8 3.4(L)  Chloride 98 - 111 mmol/L 102 103 102   CO2 22 - 32 mmol/L 26 26 28   Calcium 8.9 - 10.3 mg/dL 9.9 8.7 9.3  Total Protein 6.5 - 8.1 g/dL 6.9 6.8 -  Total Bilirubin 0.3 - 1.2 mg/dL 0.7 0.4 -  Alkaline Phos 38 - 126 U/L 122 71 -  AST 15 - 41 U/L 70(H) 47(H) -  ALT 0 - 44 U/L 89(H) 59(H) -    Lab Results  Component Value Date   WBC 3.7 (L) 02/05/2020   HGB 14.9 02/05/2020   HCT 42.7 02/05/2020   MCV 93.2 02/05/2020   PLT 96 (L) 02/05/2020   NEUTROABS 2.6 02/05/2020    ASSESSMENT & PLAN:  Hereditary hemochromatosis ([place]) Elevated liver function tests with elevated ferritin 08/2017 Ferritin : 918.9, iron saturation 43% 01/17/2018 ferritin 124, iron saturation 22% 05/30/2018: Ferritin 108, iron saturation 34% 09/21/2018: Ferritin 24, iron saturation 24% 04/05/2019:Ferritin 180, iron saturation 38% 06/29/2019: Ferritin 169, iron saturation 31% 10/02/2019: Ferritin 102, iron saturation 28% 02/05/2020: Ferritin 71, iron saturation 20%  Phlebotomy summary: 1.Phlebotomy weeklyfor 4 weeks completed 12/07/17 2.Phlebotomy every 2 weeks x4 completed 01/31/2018 3.Monthly phlebotomiesuntil November 2019,switched to every 9-month phlebotomies until 04/06/2019, switched to every 6 weeks starting 10/04/2019.  Continue with every 6 weeks phlebotomies. Follow-up in 6 months with labs done ahead of time.    Orders Placed This Encounter  Procedures  . CBC with  Differential ([facility] Only)    Standing Status:   Future    Standing Expiration Date:   02/06/2021  . CMP ([place] only)    Standing Status:   Future    Standing Expiration Date:   02/06/2021  . Ferritin    Standing Status:   Future    Standing Expiration Date:   02/06/2021  . Iron and TIBC    Standing Status:   Future    Standing Expiration Date:   02/06/2021   The patient has a good understanding of the overall plan. she agrees with it. she will call with any problems that may develop before the next visit here.  Total time spent: 20 mins including face to  face time and time spent for planning, charting and coordination of care  Nicholas Lose, MD 02/07/2020  I, Cloyde Reams Dorshimer, am acting as scribe for Dr. Nicholas Lose.  I have reviewed the above documentation for accuracy and completeness, and I agree with the above.

## 2020-02-07 ENCOUNTER — Inpatient Hospital Stay: Payer: Medicare Other

## 2020-02-07 ENCOUNTER — Inpatient Hospital Stay (HOSPITAL_BASED_OUTPATIENT_CLINIC_OR_DEPARTMENT_OTHER): Payer: Medicare Other | Admitting: Hematology and Oncology

## 2020-02-07 ENCOUNTER — Other Ambulatory Visit: Payer: Self-pay

## 2020-02-07 DIAGNOSIS — Z79899 Other long term (current) drug therapy: Secondary | ICD-10-CM | POA: Diagnosis not present

## 2020-02-07 MED ORDER — ALENDRONATE SODIUM 70 MG PO TABS: 70.0000 mg | ORAL_TABLET | ORAL | Status: DC

## 2020-02-07 NOTE — Patient Instructions (Signed)

## 2020-02-07 NOTE — Progress Notes (Signed)
Phlebotomy performed via right AC over approximately 5 minutes.  522gm removed using phlebotomy kit.  Pt tolerated well.  Food and drink provided.  Pt observed 30 minutes post procedure.  VSS

## 2020-02-07 NOTE — Assessment & Plan Note (Signed)
Elevated liver function tests with elevated ferritin 08/2017 Ferritin : 918.9, iron saturation 43% 01/17/2018 ferritin 124, iron saturation 22% 05/30/2018: Ferritin 108, iron saturation 34% 09/21/2018: Ferritin 24, iron saturation 24% 04/05/2019:Ferritin 180, iron saturation 38% 06/29/2019: Ferritin 169, iron saturation 31% 10/02/2019: Ferritin 102, iron saturation 28% 02/05/2020: Ferritin 71, iron saturation 20%  Phlebotomy summary: 1.Phlebotomy weeklyfor 4 weeks completed 12/07/17 2.Phlebotomy every 2 weeks x4 completed 01/31/2018 3.Monthly phlebotomiesuntil November 2019,switched to every 33-month phlebotomies until 04/06/2019, switched to every 6 weeks starting 10/04/2019.  Continue with every 6 weeks phlebotomies. Follow-up in 3 months with labs done ahead of time.

## 2020-02-07 NOTE — Addendum Note (Signed)
Addended by: Nicholas Lose on: 02/07/2020 11:31 AM   Modules accepted: Orders

## 2020-02-09 DIAGNOSIS — H2513 Age-related nuclear cataract, bilateral: Secondary | ICD-10-CM | POA: Diagnosis not present

## 2020-02-15 DIAGNOSIS — J45909 Unspecified asthma, uncomplicated: Secondary | ICD-10-CM | POA: Diagnosis not present

## 2020-02-15 DIAGNOSIS — M810 Age-related osteoporosis without current pathological fracture: Secondary | ICD-10-CM | POA: Diagnosis not present

## 2020-02-15 DIAGNOSIS — J449 Chronic obstructive pulmonary disease, unspecified: Secondary | ICD-10-CM | POA: Diagnosis not present

## 2020-02-15 DIAGNOSIS — I10 Essential (primary) hypertension: Secondary | ICD-10-CM | POA: Diagnosis not present

## 2020-03-04 DIAGNOSIS — H25813 Combined forms of age-related cataract, bilateral: Secondary | ICD-10-CM | POA: Diagnosis not present

## 2020-03-04 DIAGNOSIS — H52203 Unspecified astigmatism, bilateral: Secondary | ICD-10-CM | POA: Diagnosis not present

## 2020-03-04 DIAGNOSIS — H40003 Preglaucoma, unspecified, bilateral: Secondary | ICD-10-CM | POA: Diagnosis not present

## 2020-03-04 DIAGNOSIS — H02831 Dermatochalasis of right upper eyelid: Secondary | ICD-10-CM | POA: Diagnosis not present

## 2020-03-12 ENCOUNTER — Encounter: Payer: Self-pay | Admitting: Dermatology

## 2020-03-12 ENCOUNTER — Ambulatory Visit: Payer: Medicare Other | Admitting: Dermatology

## 2020-03-12 ENCOUNTER — Other Ambulatory Visit: Payer: Self-pay

## 2020-03-12 DIAGNOSIS — Z1283 Encounter for screening for malignant neoplasm of skin: Secondary | ICD-10-CM

## 2020-03-12 DIAGNOSIS — D225 Melanocytic nevi of trunk: Secondary | ICD-10-CM

## 2020-03-12 DIAGNOSIS — R202 Paresthesia of skin: Secondary | ICD-10-CM

## 2020-03-12 DIAGNOSIS — L570 Actinic keratosis: Secondary | ICD-10-CM

## 2020-03-12 DIAGNOSIS — D229 Melanocytic nevi, unspecified: Secondary | ICD-10-CM

## 2020-03-14 DIAGNOSIS — I10 Essential (primary) hypertension: Secondary | ICD-10-CM | POA: Diagnosis not present

## 2020-03-14 DIAGNOSIS — J45909 Unspecified asthma, uncomplicated: Secondary | ICD-10-CM | POA: Diagnosis not present

## 2020-03-14 DIAGNOSIS — M810 Age-related osteoporosis without current pathological fracture: Secondary | ICD-10-CM | POA: Diagnosis not present

## 2020-03-14 DIAGNOSIS — J449 Chronic obstructive pulmonary disease, unspecified: Secondary | ICD-10-CM | POA: Diagnosis not present

## 2020-03-17 ENCOUNTER — Encounter: Payer: Self-pay | Admitting: Dermatology

## 2020-03-17 NOTE — Progress Notes (Addendum)
   Follow-Up Visit   Subjective  [name] is a 69 y.o. female who presents for the following: Skin Problem (Here to check spot on left cheek. Patient said we remved it a few years ago but it back. Check arms and lower legs. ).  Crust Location: Left cheek Duration: Months Quality:  Associated Signs/Symptoms: Modifying Factors:  Severity:  Timing: Context:   The following portions of the chart were reviewed this encounter and updated as appropriate: Tobacco  Allergies  Meds  Problems  Med Hx  Surg Hx  Fam Hx      Objective  Well appearing patient in no apparent distress; mood and affect are within normal limits.  A full examination was performed including scalp, head, eyes, ears, nose, lips, neck, chest, axillae, abdomen, back, buttocks, bilateral upper extremities, bilateral lower extremities, hands, feet, fingers, toes, fingernails, and toenails. All findings within normal limits unless otherwise noted below. Breasts and pelvic girdle not examined.  Assessment & Plan  AK (actinic keratosis) (3) Left Lower Leg - Anterior; Right Lower Leg - Anterior; Left Malar Cheek  Breeze may look for Curel cream and try this daily after bathing.  If this fails, in the late fall she can call me for a prescription for [place] which we will discuss  Destruction of lesion - Left Lower Leg - Anterior, Left Malar Cheek, Right Lower Leg - Anterior  Destruction method: cryotherapy   Informed consent: discussed and consent obtained   Timeout:  patient name, date of birth, surgical site, and procedure verified Lesion destroyed using liquid nitrogen: Yes   Region frozen until ice ball extended beyond lesion: Yes   Cryotherapy cycles:  5 Outcome: patient tolerated procedure well with no complications    Notalgia paresthetica Left Lower Back  Pramoxine-based over-the-counter antipruritic; sample of CeraVe itch relief provided  Nevus Mid Back  Annual skin exam Skin cancer screening  performed today.

## 2020-03-20 ENCOUNTER — Other Ambulatory Visit: Payer: Self-pay

## 2020-03-20 ENCOUNTER — Inpatient Hospital Stay: Payer: Medicare Other | Attending: Hematology and Oncology

## 2020-03-20 NOTE — Patient Instructions (Signed)

## 2020-03-20 NOTE — Progress Notes (Signed)
Phlebotomy performed via RAC over approximately 5 minutes. 558gm removed using 16g phlebotomy kit.  Patient tolerated well.  Needle removed intact.  Food and drink provided.  Patient observed 30 minutes post procedure.  VSS.  No c/o or s/s of distress or discomfort. 

## 2020-04-25 DIAGNOSIS — J45909 Unspecified asthma, uncomplicated: Secondary | ICD-10-CM | POA: Diagnosis not present

## 2020-04-25 DIAGNOSIS — E7800 Pure hypercholesterolemia, unspecified: Secondary | ICD-10-CM | POA: Diagnosis not present

## 2020-04-25 DIAGNOSIS — J449 Chronic obstructive pulmonary disease, unspecified: Secondary | ICD-10-CM | POA: Diagnosis not present

## 2020-04-25 DIAGNOSIS — I10 Essential (primary) hypertension: Secondary | ICD-10-CM | POA: Diagnosis not present

## 2020-05-01 ENCOUNTER — Other Ambulatory Visit: Payer: Self-pay

## 2020-05-01 ENCOUNTER — Inpatient Hospital Stay: Payer: Medicare Other | Attending: Hematology and Oncology

## 2020-05-01 NOTE — Progress Notes (Signed)
Therapeutic phlebotomy performed in right AC using 16g angiocath. Procedure started and 2162 and completed at 4469, removing 527mL of blood. Pt tolerated procedure well. PO fluids provided. Discharged after 30 minutes of observation with stable vital signs.

## 2020-05-01 NOTE — Patient Instructions (Signed)

## 2020-05-20 ENCOUNTER — Encounter: Payer: Self-pay | Admitting: Hematology and Oncology

## 2020-05-21 DIAGNOSIS — J449 Chronic obstructive pulmonary disease, unspecified: Secondary | ICD-10-CM | POA: Diagnosis not present

## 2020-05-21 DIAGNOSIS — I10 Essential (primary) hypertension: Secondary | ICD-10-CM | POA: Diagnosis not present

## 2020-05-21 DIAGNOSIS — J45909 Unspecified asthma, uncomplicated: Secondary | ICD-10-CM | POA: Diagnosis not present

## 2020-05-21 DIAGNOSIS — E7800 Pure hypercholesterolemia, unspecified: Secondary | ICD-10-CM | POA: Diagnosis not present

## 2020-06-12 ENCOUNTER — Inpatient Hospital Stay: Payer: Medicare Other | Attending: Hematology and Oncology

## 2020-06-12 ENCOUNTER — Other Ambulatory Visit: Payer: Self-pay

## 2020-06-12 NOTE — Patient Instructions (Signed)
Therapeutic Phlebotomy Therapeutic phlebotomy is the planned removal of blood from a person's body for the purpose of treating a medical condition. The procedure is similar to donating blood. Usually, about a pint (470 mL, or 0.47 L) of blood is removed. The average adult has 9-12 pints (4.3-5.7 L) of blood in the body. Therapeutic phlebotomy may be used to treat the following medical conditions:  Hemochromatosis. This is a condition in which the blood contains too much iron.  Polycythemia vera. This is a condition in which the blood contains too many red blood cells.  Porphyria cutanea tarda. This is a disease in which an important part of hemoglobin is not made properly. It results in the buildup of abnormal amounts of porphyrins in the body.  Sickle cell disease. This is a condition in which the red blood cells form an abnormal crescent shape rather than a round shape. Tell a health care provider about:  Any allergies you have.  All medicines you are taking, including vitamins, herbs, eye drops, creams, and over-the-counter medicines.  Any problems you or family members have had with anesthetic medicines.  Any blood disorders you have.  Any surgeries you have had.  Any medical conditions you have.  Whether you are pregnant or may be pregnant. What are the risks? Generally, this is a safe procedure. However, problems may occur, including:  Nausea or light-headedness.  Low blood pressure (hypotension).  Soreness, bleeding, swelling, or bruising at the needle insertion site.  Infection. What happens before the procedure?  Follow instructions from your health care provider about eating or drinking restrictions.  Ask your health care provider about: ? Changing or stopping your regular medicines. This is especially important if you are taking diabetes medicines or blood thinners (anticoagulants). ? Taking medicines such as aspirin and ibuprofen. These medicines can thin your  blood. Do not take these medicines unless your health care provider tells you to take them. ? Taking over-the-counter medicines, vitamins, herbs, and supplements.  Wear clothing with sleeves that can be raised above the elbow.  Plan to have someone take you home from the hospital or clinic.  You may have a blood sample taken.  Your blood pressure, pulse rate, and breathing rate will be measured. What happens during the procedure?   To lower your risk of infection: ? Your health care team will wash or sanitize their hands. ? Your skin will be cleaned with an antiseptic.  You may be given a medicine to numb the area (local anesthetic).  A tourniquet will be placed on your arm.  A needle will be inserted into one of your veins.  Tubing and a collection bag will be attached to that needle.  Blood will flow through the needle and tubing into the collection bag.  The collection bag will be placed lower than your arm to allow gravity to help the flow of blood into the bag.  You may be asked to open and close your hand slowly and continually during the entire collection.  After the specified amount of blood has been removed from your body, the collection bag and tubing will be clamped.  The needle will be removed from your vein.  Pressure will be held on the site of the needle insertion to stop the bleeding.  A bandage (dressing) will be placed over the needle insertion site. The procedure may vary among health care providers and hospitals. What happens after the procedure?  Your blood pressure, pulse rate, and breathing rate will be   measured after the procedure.  You will be encouraged to drink fluids.  Your recovery will be assessed and monitored.  You can return to your normal activities as told by your health care provider. Summary  Therapeutic phlebotomy is the planned removal of blood from a person's body for the purpose of treating a medical condition.  Therapeutic  phlebotomy may be used to treat hemochromatosis, polycythemia vera, porphyria cutanea tarda, or sickle cell disease.  In the procedure, a needle is inserted and about a pint (470 mL, or 0.47 L) of blood is removed. The average adult has 9-12 pints (4.3-5.7 L) of blood in the body.  This is generally a safe procedure, but it can sometimes cause problems such as nausea, light-headedness, or low blood pressure (hypotension). This information is not intended to replace advice given to you by your health care provider. Make sure you discuss any questions you have with your health care provider. Document Revised: 10/21/2017 Document Reviewed: 10/21/2017 Elsevier Patient Education  2020 [employer]  

## 2020-06-28 DIAGNOSIS — I10 Essential (primary) hypertension: Secondary | ICD-10-CM | POA: Diagnosis not present

## 2020-06-28 DIAGNOSIS — E7800 Pure hypercholesterolemia, unspecified: Secondary | ICD-10-CM | POA: Diagnosis not present

## 2020-06-28 DIAGNOSIS — J45909 Unspecified asthma, uncomplicated: Secondary | ICD-10-CM | POA: Diagnosis not present

## 2020-06-28 DIAGNOSIS — J449 Chronic obstructive pulmonary disease, unspecified: Secondary | ICD-10-CM | POA: Diagnosis not present

## 2020-07-23 ENCOUNTER — Other Ambulatory Visit: Payer: Self-pay

## 2020-07-23 ENCOUNTER — Inpatient Hospital Stay: Payer: Medicare Other | Attending: Hematology and Oncology

## 2020-07-23 DIAGNOSIS — H25813 Combined forms of age-related cataract, bilateral: Secondary | ICD-10-CM | POA: Diagnosis not present

## 2020-07-23 DIAGNOSIS — Z23 Encounter for immunization: Secondary | ICD-10-CM | POA: Diagnosis not present

## 2020-07-23 LAB — FERRITIN: Ferritin: 41 ng/mL (ref 11–307)

## 2020-07-23 LAB — CBC WITH DIFFERENTIAL (CANCER CENTER ONLY)
Abs Immature Granulocytes: 0.02 10*3/uL (ref 0.00–0.07)
Basophils Absolute: 0 10*3/uL (ref 0.0–0.1)
Basophils Relative: 1 %
Eosinophils Absolute: 0.1 10*3/uL (ref 0.0–0.5)
Eosinophils Relative: 3 %
HCT: 39.8 % (ref 36.0–46.0)
Hemoglobin: 13.2 g/dL (ref 12.0–15.0)
Immature Granulocytes: 1 %
Lymphocytes Relative: 15 %
Lymphs Abs: 0.7 10*3/uL (ref 0.7–4.0)
MCH: 30.5 pg (ref 26.0–34.0)
MCHC: 33.2 g/dL (ref 30.0–36.0)
MCV: 91.9 fL (ref 80.0–100.0)
Monocytes Absolute: 0.3 10*3/uL (ref 0.1–1.0)
Monocytes Relative: 8 %
Neutro Abs: 3.2 10*3/uL (ref 1.7–7.7)
Neutrophils Relative %: 72 %
Platelet Count: 105 10*3/uL — ABNORMAL LOW (ref 150–400)
RBC: 4.33 MIL/uL (ref 3.87–5.11)
RDW: 13.3 % (ref 11.5–15.5)
WBC Count: 4.4 10*3/uL (ref 4.0–10.5)
nRBC: 0 % (ref 0.0–0.2)

## 2020-07-23 LAB — CMP (CANCER CENTER ONLY)
ALT: 103 U/L — ABNORMAL HIGH (ref 0–44)
AST: 94 U/L — ABNORMAL HIGH (ref 15–41)
Albumin: 4.2 g/dL (ref 3.5–5.0)
Alkaline Phosphatase: 116 U/L (ref 38–126)
Anion gap: 8 (ref 5–15)
BUN: 15 mg/dL (ref 8–23)
CO2: 29 mmol/L (ref 22–32)
Calcium: 9.5 mg/dL (ref 8.9–10.3)
Chloride: 104 mmol/L (ref 98–111)
Creatinine: 0.69 mg/dL (ref 0.44–1.00)
GFR, Estimated: >60 mL/min (ref >60)
Glucose, Bld: 165 mg/dL — ABNORMAL HIGH (ref 70–99)
Potassium: 4.9 mmol/L (ref 3.5–5.1)
Sodium: 141 mmol/L (ref 135–145)
Total Bilirubin: 0.7 mg/dL (ref 0.3–1.2)
Total Protein: 6.9 g/dL (ref 6.5–8.1)

## 2020-07-23 LAB — IRON AND TIBC
Iron: 51 ug/dL (ref 41–142)
Saturation Ratios: 13 % — ABNORMAL LOW (ref 21–57)
TIBC: 400 ug/dL (ref 236–444)
UIBC: 349 ug/dL (ref 120–384)

## 2020-07-24 NOTE — Progress Notes (Signed)
Patient Care Team: Lawerance Cruel, MD as PCP - General (Family Medicine) Lavonna Monarch, MD as Consulting Physician (Dermatology)  DIAGNOSIS:    ICD-10-CM   1. Hereditary hemochromatosis ([place])  E83.110     CHIEF COMPLIANT: Follow-up of hereditary hemochromatosis  INTERVAL HISTORY: [name] is a 69 y.o. with above-mentioned history of hereditary hemochromatosis on phlebotomiesevery6 weeks, most recently8/25/21. Her most recent labs from4/19/21 showed: WBC4.4, Hg 13.2, HCT 39.8, platelets 105, creatinine 0.69, AST 94, ALT 103, iron saturation13%, ferritin 41. She presentsto the clinic today to review her recent labs. Her major complaints today are muscle aches and pains especially in the morning which gets better with activity.  Her other complaint is weight gain.  ALLERGIES:  is allergic to codeine.  MEDICATIONS:  Current Outpatient Medications  Medication Sig Dispense Refill  . alendronate (FOSAMAX) 70 MG tablet Take 1 tablet (70 mg total) by mouth once a week. Take with a full glass of water on an empty stomach.    Marland Kitchen amLODipine (NORVASC) 10 MG tablet Take 10 mg by mouth daily.    . cholecalciferol (VITAMIN D) 1000 units tablet Take 5,000 Units by mouth daily.    Marland Kitchen losartan (COZAAR) 100 MG tablet Take 100 mg by mouth daily.    . pravastatin (PRAVACHOL) 40 MG tablet Take 40 mg by mouth daily.    . sertraline (ZOLOFT) 100 MG tablet     . sertraline (ZOLOFT) 50 MG tablet Take 50 mg by mouth daily.    . TRELEGY ELLIPTA 100-62.5-25 MCG/INH AEPB      No current facility-administered medications for this visit.    PHYSICAL EXAMINATION: ECOG PERFORMANCE STATUS: 1 - Symptomatic but completely ambulatory  There were no vitals filed for this visit. There were no vitals filed for this visit.  LABORATORY DATA:  I have reviewed the data as listed CMP Latest Ref Rng & Units 07/23/2020 02/05/2020 07/01/2013  Glucose 70 - 99 mg/dL 165(H) 203(H) 128(H)  BUN 8 - 23 mg/dL 15  12 13   Creatinine 0.44 - 1.00 mg/dL 0.69 0.74 0.57  Sodium 135 - 145 mmol/L 141 136 143  Potassium 3.5 - 5.1 mmol/L 4.9 4.4 3.8  Chloride 98 - 111 mmol/L 104 102 103  CO2 22 - 32 mmol/L 29 26 26   Calcium 8.9 - 10.3 mg/dL 9.5 9.9 8.7  Total Protein 6.5 - 8.1 g/dL 6.9 6.9 6.8  Total Bilirubin 0.3 - 1.2 mg/dL 0.7 0.7 0.4  Alkaline Phos 38 - 126 U/L 116 122 71  AST 15 - 41 U/L 94(H) 70(H) 47(H)  ALT 0 - 44 U/L 103(H) 89(H) 59(H)    Lab Results  Component Value Date   WBC 4.4 07/23/2020   HGB 13.2 07/23/2020   HCT 39.8 07/23/2020   MCV 91.9 07/23/2020   PLT 105 (L) 07/23/2020   NEUTROABS 3.2 07/23/2020    ASSESSMENT & PLAN:  Hereditary hemochromatosis ([place]) Elevated liver function tests with elevated ferritin 08/2017 Ferritin : 918.9, iron saturation 43% 01/17/2018 ferritin 124, iron saturation 22% 05/30/2018: Ferritin 108, iron saturation 34% 09/21/2018: Ferritin 24, iron saturation 24% 04/05/2019:Ferritin 180, iron saturation 38% 06/29/2019: Ferritin 169, iron saturation 31% 10/02/2019: Ferritin 102, iron saturation 28% 02/05/2020: Ferritin 71, iron saturation 20% 07/23/2020: Ferritin 41, iron saturation 13%  Phlebotomy summary: 1.Phlebotomy weeklyfor 4 weeks completed 12/07/17 2.Phlebotomy every 2 weeks x4 completed 01/31/2018 3.Monthly phlebotomiesuntil November 2019,switched to every 68-month phlebotomies until 04/06/2019, switched to every 6 weeks starting 10/04/2019.  Switching to phlebotomies every 8  weeks  Obesity: I discussed with her about different ways to lose weight.  Follow-up in 6 months with labs done ahead of time.    No orders of the defined types were placed in this encounter.  The patient has a good understanding of the overall plan. she agrees with it. she will call with any problems that may develop before the next visit here.  Total time spent: 30 mins including face to face time and time spent for planning, charting and coordination of  care  Nicholas Lose, MD 07/25/2020  I, Cloyde Reams Dorshimer, am acting as scribe for Dr. Nicholas Lose.  I have reviewed the above documentation for accuracy and completeness, and I agree with the above.

## 2020-07-25 ENCOUNTER — Inpatient Hospital Stay: Payer: Medicare Other

## 2020-07-25 ENCOUNTER — Other Ambulatory Visit: Payer: Self-pay

## 2020-07-25 ENCOUNTER — Inpatient Hospital Stay (HOSPITAL_BASED_OUTPATIENT_CLINIC_OR_DEPARTMENT_OTHER): Payer: Medicare Other | Admitting: Hematology and Oncology

## 2020-07-25 VITALS — BP 126/68 | HR 83

## 2020-07-25 DIAGNOSIS — Z23 Encounter for immunization: Secondary | ICD-10-CM

## 2020-07-25 MED ORDER — INFLUENZA VAC A&B SA ADJ QUAD 0.5 ML IM PRSY
0.5000 mL | PREFILLED_SYRINGE | Freq: Once | INTRAMUSCULAR | Status: AC
  Administered 2020-07-25: 0.5 mL via INTRAMUSCULAR

## 2020-07-25 MED ORDER — INFLUENZA VAC A&B SA ADJ QUAD 0.5 ML IM PRSY
PREFILLED_SYRINGE | INTRAMUSCULAR | Status: AC
  Filled 2020-07-25: qty 0.5

## 2020-07-25 NOTE — Patient Instructions (Signed)

## 2020-07-25 NOTE — Assessment & Plan Note (Signed)
Elevated liver function tests with elevated ferritin 08/2017 Ferritin : 918.9, iron saturation 43% 01/17/2018 ferritin 124, iron saturation 22% 05/30/2018: Ferritin 108, iron saturation 34% 09/21/2018: Ferritin 24, iron saturation 24% 04/05/2019:Ferritin 180, iron saturation 38% 06/29/2019: Ferritin 169, iron saturation 31% 10/02/2019: Ferritin 102, iron saturation 28% 02/05/2020: Ferritin 71, iron saturation 20% 07/23/2020: Ferritin 41, iron saturation 13%  Phlebotomy summary: 1.Phlebotomy weeklyfor 4 weeks completed 12/07/17 2.Phlebotomy every 2 weeks x4 completed 01/31/2018 3.Monthly phlebotomiesuntil November 2019,switched to every 33-month phlebotomies until 04/06/2019, switched to every 6 weeks starting 10/04/2019.  Switching to phlebotomies every 12 weeks  Continue with every 12 weeks phlebotomies. Follow-up in 6 months with labs done ahead of time.

## 2020-07-25 NOTE — Progress Notes (Signed)
[name] presents today for phlebotomy per MD Gudena orders. Phlebotomy procedure started at 1014 and ended at 1030. 550 grams removed. Patient observed for 15 minutes after procedure without any incident, declined to stay for rest of observation time. Patient tolerated procedure well.  VS stable.  Ate/drank during w/out any issues. 18 G RAC IV needle removed intact.

## 2020-07-29 ENCOUNTER — Telehealth: Payer: Self-pay | Admitting: Hematology and Oncology

## 2020-07-29 DIAGNOSIS — E7800 Pure hypercholesterolemia, unspecified: Secondary | ICD-10-CM | POA: Diagnosis not present

## 2020-07-29 DIAGNOSIS — J45909 Unspecified asthma, uncomplicated: Secondary | ICD-10-CM | POA: Diagnosis not present

## 2020-07-29 DIAGNOSIS — J449 Chronic obstructive pulmonary disease, unspecified: Secondary | ICD-10-CM | POA: Diagnosis not present

## 2020-07-29 DIAGNOSIS — I10 Essential (primary) hypertension: Secondary | ICD-10-CM | POA: Diagnosis not present

## 2020-07-29 NOTE — Telephone Encounter (Signed)
Scheduled per 10/7 los. Called and spoke with pt, confirmed all added appts

## 2020-08-01 DIAGNOSIS — H52222 Regular astigmatism, left eye: Secondary | ICD-10-CM | POA: Diagnosis not present

## 2020-08-01 DIAGNOSIS — H2512 Age-related nuclear cataract, left eye: Secondary | ICD-10-CM | POA: Diagnosis not present

## 2020-08-01 DIAGNOSIS — E785 Hyperlipidemia, unspecified: Secondary | ICD-10-CM | POA: Diagnosis not present

## 2020-08-01 DIAGNOSIS — J449 Chronic obstructive pulmonary disease, unspecified: Secondary | ICD-10-CM | POA: Diagnosis not present

## 2020-08-01 DIAGNOSIS — H25812 Combined forms of age-related cataract, left eye: Secondary | ICD-10-CM | POA: Diagnosis not present

## 2020-08-01 DIAGNOSIS — I10 Essential (primary) hypertension: Secondary | ICD-10-CM | POA: Diagnosis not present

## 2020-08-28 DIAGNOSIS — I10 Essential (primary) hypertension: Secondary | ICD-10-CM | POA: Diagnosis not present

## 2020-08-28 DIAGNOSIS — J449 Chronic obstructive pulmonary disease, unspecified: Secondary | ICD-10-CM | POA: Diagnosis not present

## 2020-08-28 DIAGNOSIS — E7800 Pure hypercholesterolemia, unspecified: Secondary | ICD-10-CM | POA: Diagnosis not present

## 2020-08-28 DIAGNOSIS — J45909 Unspecified asthma, uncomplicated: Secondary | ICD-10-CM | POA: Diagnosis not present

## 2020-09-09 IMAGING — MG DIGITAL SCREENING BILATERAL MAMMOGRAM WITH TOMO AND CAD
8 series · 8 of 24 positions shown · non-contrast
Comparison: Previous exam(s).

CLINICAL DATA: Screening.

EXAM:
DIGITAL SCREENING BILATERAL MAMMOGRAM WITH TOMO AND CAD

[R CC synth-2D]
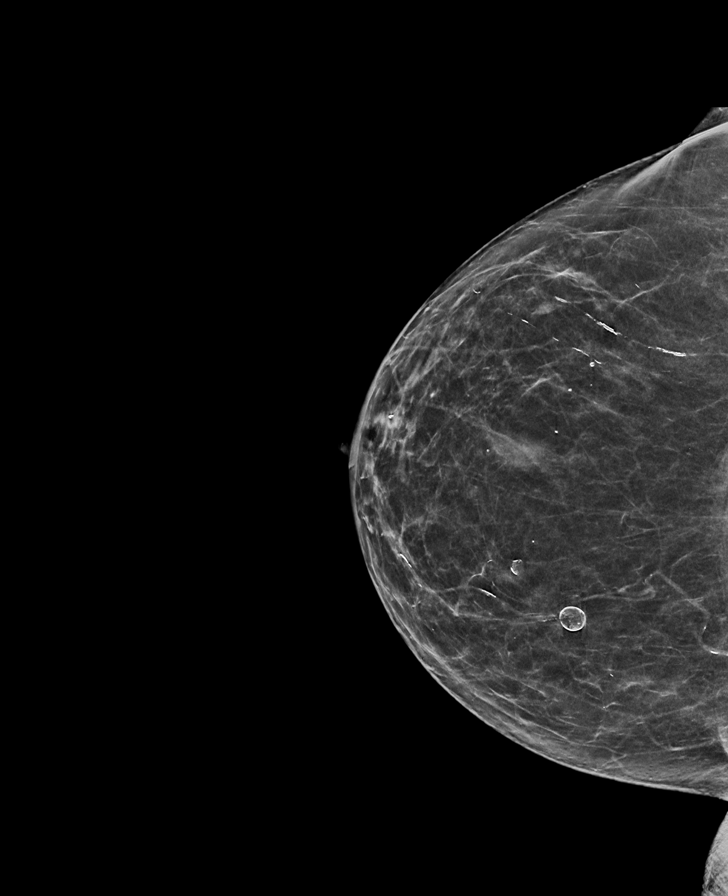

[R MLO synth-2D]
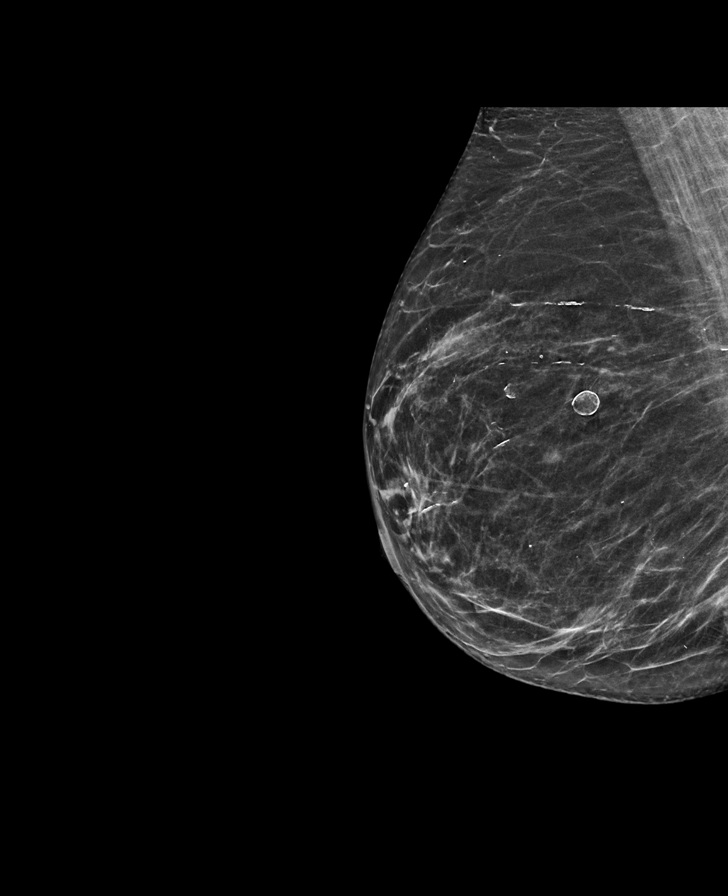

[L MLO synth-2D]
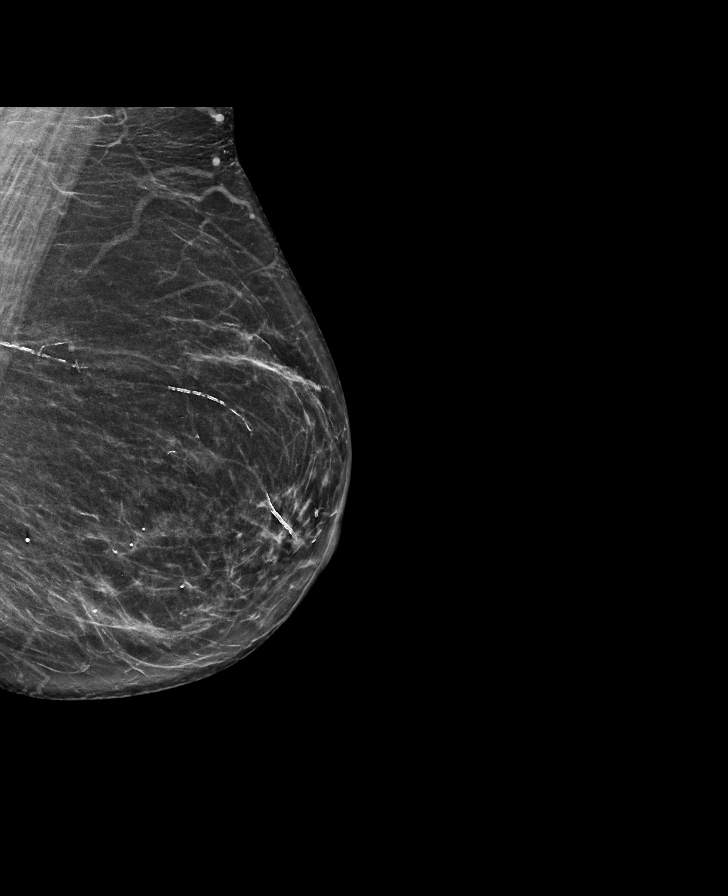

[L CC synth-2D]
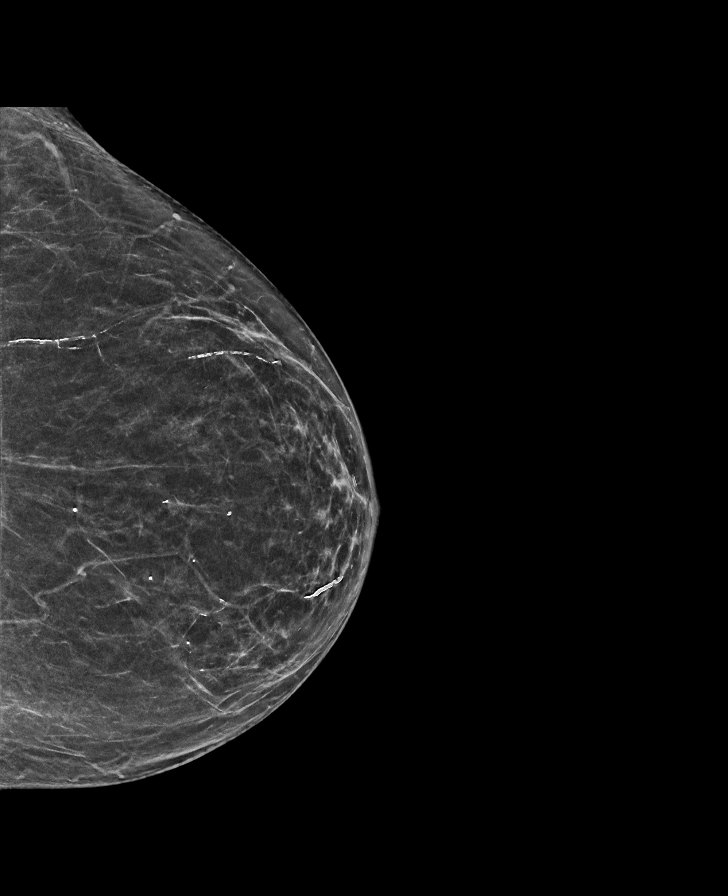

[R MLO tomo · tomo slice 35/68.0]
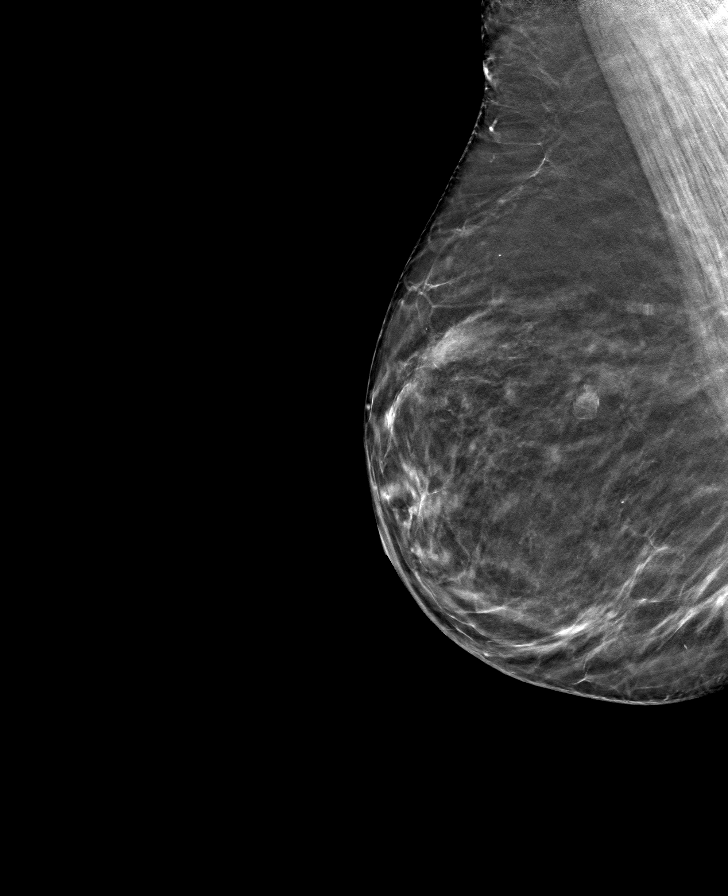

[L CC tomo · tomo slice 33/64.0]
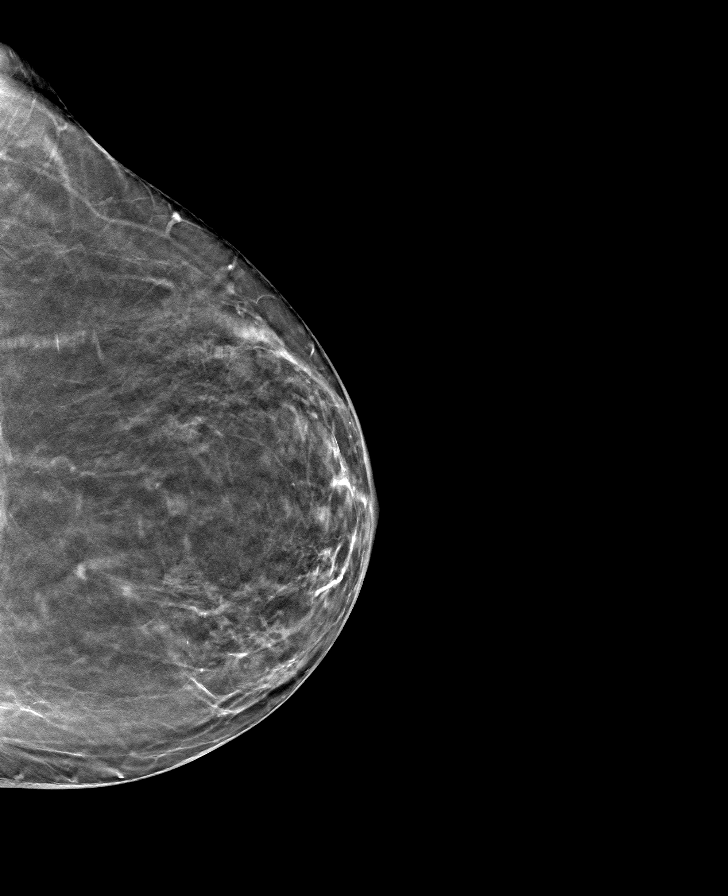

[L MLO tomo · tomo slice 36/71.0]
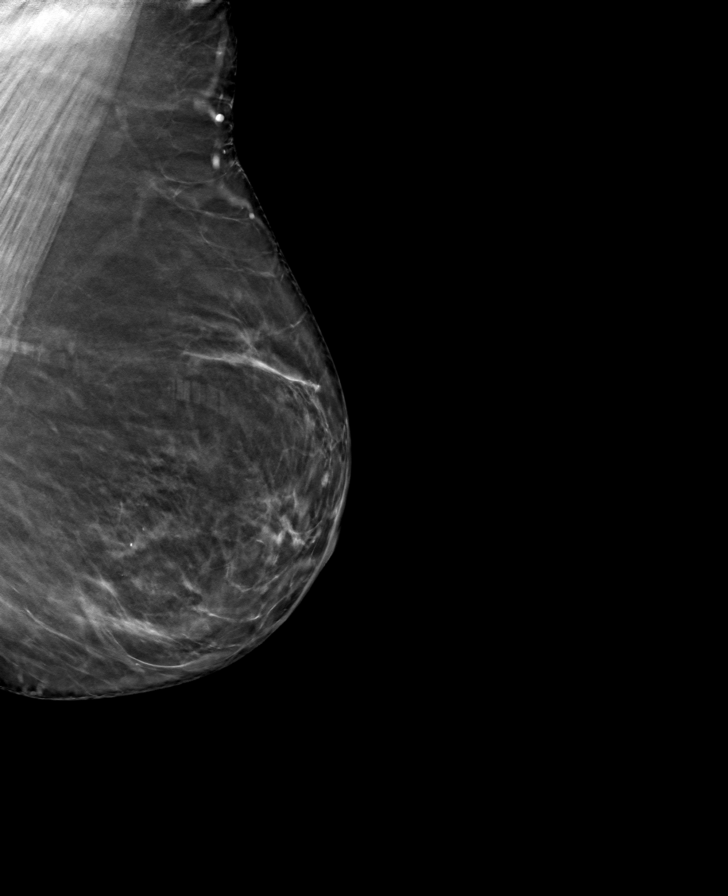

[R CC tomo · tomo slice 33/65.0]
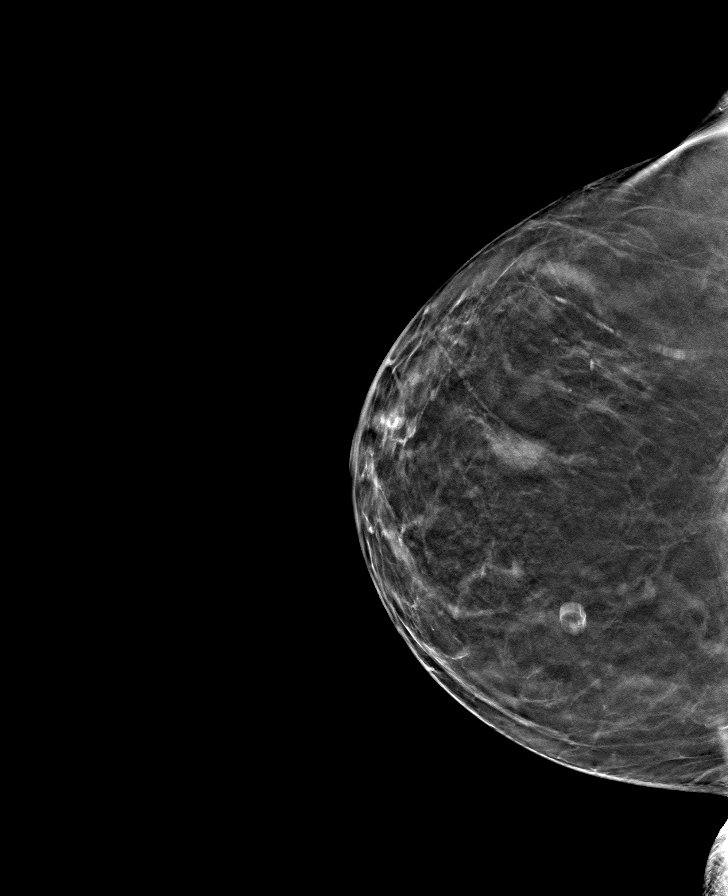

[8 of 24 positions shown; findings below may reference images not displayed]

ACR Breast Density Category b: There are scattered areas of
fibroglandular density.
FINDINGS: There are no findings suspicious for malignancy. Images were
processed with CAD.
IMPRESSION: No mammographic evidence of malignancy. A result letter of this
screening mammogram will be mailed directly to the patient.

RECOMMENDATION:
Screening mammogram in one year. (Code:[id number])

BI-RADS CATEGORY  1: Negative.

## 2020-09-19 ENCOUNTER — Other Ambulatory Visit: Payer: Self-pay

## 2020-09-19 ENCOUNTER — Inpatient Hospital Stay: Payer: Medicare Other | Attending: Hematology and Oncology

## 2020-09-19 DIAGNOSIS — Z79899 Other long term (current) drug therapy: Secondary | ICD-10-CM | POA: Insufficient documentation

## 2020-09-19 NOTE — Progress Notes (Signed)
Pt discharged in stable condition.

## 2020-09-19 NOTE — Patient Instructions (Signed)

## 2020-09-26 DIAGNOSIS — E785 Hyperlipidemia, unspecified: Secondary | ICD-10-CM | POA: Diagnosis not present

## 2020-09-26 DIAGNOSIS — F419 Anxiety disorder, unspecified: Secondary | ICD-10-CM | POA: Diagnosis not present

## 2020-09-26 DIAGNOSIS — J449 Chronic obstructive pulmonary disease, unspecified: Secondary | ICD-10-CM | POA: Diagnosis not present

## 2020-09-26 DIAGNOSIS — H25811 Combined forms of age-related cataract, right eye: Secondary | ICD-10-CM | POA: Diagnosis not present

## 2020-09-26 DIAGNOSIS — I10 Essential (primary) hypertension: Secondary | ICD-10-CM | POA: Diagnosis not present

## 2020-09-26 DIAGNOSIS — H2511 Age-related nuclear cataract, right eye: Secondary | ICD-10-CM | POA: Diagnosis not present

## 2020-10-02 DIAGNOSIS — Z0000 Encounter for general adult medical examination without abnormal findings: Secondary | ICD-10-CM | POA: Diagnosis not present

## 2020-10-02 DIAGNOSIS — D696 Thrombocytopenia, unspecified: Secondary | ICD-10-CM | POA: Diagnosis not present

## 2020-10-02 DIAGNOSIS — I700 Atherosclerosis of aorta: Secondary | ICD-10-CM | POA: Diagnosis not present

## 2020-10-02 DIAGNOSIS — F419 Anxiety disorder, unspecified: Secondary | ICD-10-CM | POA: Diagnosis not present

## 2020-10-02 LAB — CBC AND DIFFERENTIAL
HCT: 43 (ref 36–46)
Hemoglobin: 14.3 (ref 12.0–16.0)
Platelets: 126 — AB (ref 150–399)
WBC: 5.5

## 2020-10-02 LAB — BASIC METABOLIC PANEL
BUN: 12 (ref 4–21)
CO2: 31 — AB (ref 13–22)
Chloride: 101 (ref 99–108)
Creatinine: 0.6 (ref 0.5–1.1)
Glucose: 155
Potassium: 4.4 (ref 3.4–5.3)
Sodium: 142 (ref 137–147)

## 2020-10-02 LAB — COMPREHENSIVE METABOLIC PANEL WITH GFR
Albumin: 4.8 (ref 3.5–5.0)
Calcium: 9.7 (ref 8.7–10.7)
GFR calc Af Amer: 118
GFR calc non Af Amer: 98

## 2020-10-02 LAB — HEPATIC FUNCTION PANEL
ALT: 74 — AB (ref 7–35)
AST: 65 — AB (ref 13–35)
Bilirubin, Total: 1

## 2020-10-02 LAB — CBC: RBC: 4.69 (ref 3.87–5.11)

## 2020-10-02 LAB — LIPID PANEL
Cholesterol: 193 (ref 0–200)
HDL: 57 (ref 35–70)
LDl/HDL Ratio: 3.4
Triglycerides: 233 — AB (ref 40–160)

## 2020-10-02 LAB — MICROALBUMIN, URINE: Microalb, Ur: 4.41

## 2020-10-03 DIAGNOSIS — E7800 Pure hypercholesterolemia, unspecified: Secondary | ICD-10-CM | POA: Diagnosis not present

## 2020-10-03 DIAGNOSIS — J45909 Unspecified asthma, uncomplicated: Secondary | ICD-10-CM | POA: Diagnosis not present

## 2020-10-03 DIAGNOSIS — J449 Chronic obstructive pulmonary disease, unspecified: Secondary | ICD-10-CM | POA: Diagnosis not present

## 2020-10-03 DIAGNOSIS — I10 Essential (primary) hypertension: Secondary | ICD-10-CM | POA: Diagnosis not present

## 2020-10-14 ENCOUNTER — Ambulatory Visit
Admission: RE | Admit: 2020-10-14 | Discharge: 2020-10-14 | Disposition: A | Discharge status: Home / Self Care | Payer: Medicare Other | Source: Ambulatory Visit | Attending: Acute Care | Admitting: Acute Care

## 2020-10-14 DIAGNOSIS — Z87891 Personal history of nicotine dependence: Secondary | ICD-10-CM

## 2020-10-14 DIAGNOSIS — J9811 Atelectasis: Secondary | ICD-10-CM | POA: Diagnosis not present

## 2020-10-14 DIAGNOSIS — J432 Centrilobular emphysema: Secondary | ICD-10-CM | POA: Diagnosis not present

## 2020-10-14 DIAGNOSIS — D7389 Other diseases of spleen: Secondary | ICD-10-CM | POA: Diagnosis not present

## 2020-10-22 ENCOUNTER — Other Ambulatory Visit: Payer: Self-pay | Admitting: *Deleted

## 2020-10-22 DIAGNOSIS — Z87891 Personal history of nicotine dependence: Secondary | ICD-10-CM

## 2020-10-22 NOTE — Progress Notes (Signed)
Please call patient and let them  know their  low dose Ct was read as a Lung RADS 2: nodules that are benign in appearance and behavior with a very low likelihood of becoming a clinically active cancer due to size or lack of growth. Recommendation per radiology is for a repeat LDCT in 12 months. .Please let them  know we will order and schedule their  annual screening scan for 09/2022. Please let them  know there was notation of CAD on their  scan.  Please remind the patient  that this is a non-gated exam therefore degree or severity of disease  cannot be determined. Please have them  follow up with their PCP regarding potential risk factor modification, dietary therapy or pharmacologic therapy if clinically indicated. Pt.  is  currently on statin therapy. Please place order for annual  screening scan for  09/2022 and fax results to PCP. Thanks so much.

## 2020-10-29 DIAGNOSIS — J45909 Unspecified asthma, uncomplicated: Secondary | ICD-10-CM | POA: Diagnosis not present

## 2020-10-29 DIAGNOSIS — J449 Chronic obstructive pulmonary disease, unspecified: Secondary | ICD-10-CM | POA: Diagnosis not present

## 2020-10-29 DIAGNOSIS — I10 Essential (primary) hypertension: Secondary | ICD-10-CM | POA: Diagnosis not present

## 2020-10-29 DIAGNOSIS — E7800 Pure hypercholesterolemia, unspecified: Secondary | ICD-10-CM | POA: Diagnosis not present

## 2020-11-14 ENCOUNTER — Inpatient Hospital Stay: Payer: Medicare Other | Attending: Hematology and Oncology

## 2020-11-14 ENCOUNTER — Other Ambulatory Visit: Payer: Self-pay

## 2020-11-14 DIAGNOSIS — Z79899 Other long term (current) drug therapy: Secondary | ICD-10-CM | POA: Diagnosis not present

## 2020-11-14 NOTE — Patient Instructions (Signed)

## 2020-11-14 NOTE — Progress Notes (Signed)
[name] presents today for phlebotomy per MD orders. 16 g to R antecubital. Phlebotomy procedure started at 0958 and ended at . 1003.  525 grams removed. Patient observed for 30 minutes after procedure without any incident.  Water given. Patient tolerated procedure well. IV needle removed intact.

## 2020-11-28 DIAGNOSIS — I10 Essential (primary) hypertension: Secondary | ICD-10-CM | POA: Diagnosis not present

## 2020-11-28 DIAGNOSIS — M810 Age-related osteoporosis without current pathological fracture: Secondary | ICD-10-CM | POA: Diagnosis not present

## 2020-11-28 DIAGNOSIS — J449 Chronic obstructive pulmonary disease, unspecified: Secondary | ICD-10-CM | POA: Diagnosis not present

## 2020-11-28 DIAGNOSIS — E7800 Pure hypercholesterolemia, unspecified: Secondary | ICD-10-CM | POA: Diagnosis not present

## 2020-12-04 DIAGNOSIS — M89311 Hypertrophy of bone, right shoulder: Secondary | ICD-10-CM | POA: Diagnosis not present

## 2020-12-04 DIAGNOSIS — M898X9 Other specified disorders of bone, unspecified site: Secondary | ICD-10-CM | POA: Diagnosis not present

## 2020-12-12 ENCOUNTER — Ambulatory Visit: Payer: Medicare Other | Admitting: Orthopedic Surgery

## 2020-12-12 ENCOUNTER — Ambulatory Visit: Payer: Self-pay

## 2020-12-12 ENCOUNTER — Encounter: Payer: Self-pay | Admitting: Orthopedic Surgery

## 2020-12-12 DIAGNOSIS — Q740 Other congenital malformations of upper limb(s), including shoulder girdle: Secondary | ICD-10-CM | POA: Diagnosis not present

## 2020-12-12 MED ORDER — DOXYCYCLINE HYCLATE 100 MG PO TABS: 100.0000 mg | ORAL_TABLET | Freq: Two times a day (BID) | ORAL | 0 refills | Status: DC

## 2020-12-12 NOTE — Progress Notes (Signed)
Office Visit Note   Patient: [name]           Date of Birth: 04/14/1951           MRN: [id number] Visit Date: 12/12/2020              Requested by: Lawerance Cruel, [place],  [place] [address] PCP: Lawerance Cruel, MD  Chief Complaint  Patient presents with  . Other    Noticed knot at [facility] joint about 1-2 weeks ago without injury      HPI: Patient is a 70 year old woman who presents with a 2-week history of swelling around the right sternoclavicular joint.  She denies any trauma.  Denies any recent infection.  She is not on any disease modifying drugs or chemotherapy drugs.  Assessment & Plan: Visit Diagnoses:  1. Abnormal prominence of clavicle     Plan: Discussed that the inflammation at the sternoclavicular joint could either be degenerative changes or infection.  Will start on doxycycline she will use Voltaren gel over the sternoclavicular joint 4 times a day.  Follow-Up Instructions: Return in about 3 weeks (around 01/02/2021).   Ortho Exam  Patient is alert, oriented, no adenopathy, well-dressed, normal affect, normal respiratory effort. Examination patient does have tenderness to palpation of the [facility] joint on the right there is no redness or cellulitis over the [facility] joint.  The sternoclavicular joint is also tender to palpation with an area of 2 cm in diameter of redness.  There is no open wounds no drainage there is mild prominence of the sternoclavicular joint it is not dislocated.  Imaging: XR Clavicle Right  Result Date: 12/12/2020 2 view radiographs of the right clavicle shows degenerative arthritic changes of the [facility] joint and degenerative changes of the sternoclavicular joint on the right.  No images are attached to the encounter.  Labs: Lab Results  Component Value Date   ESRSEDRATE 101 (H) 06/06/2011   ESRSEDRATE 49 (H) 06/04/2011   LABURIC 3.7 06/04/2011   REPTSTATUS 07/02/2013 FINAL 07/01/2013   GRAMSTAIN  06/06/2011     NO ORGANISMS SEEN RARE WBC PRESENT, PREDOMINANTLY PMN Gram Stain Report Called to,Read Back By and Verified With: DR. Veverly Fells AT 1912 ON [id number] BY LOVE,T.   CULT NO GROWTH Performed at [facility] 07/01/2013   [place] 06/06/2011     Lab Results  Component Value Date   ALBUMIN 4.2 07/23/2020   ALBUMIN 4.2 02/05/2020   ALBUMIN 4.0 07/01/2013   LABURIC 3.7 06/04/2011    No results found for: MG No results found for: VD25OH  No results found for: PREALBUMIN CBC EXTENDED Latest Ref Rng & Units 07/23/2020 02/05/2020 10/02/2019  WBC 4.0 - 10.5 K/uL 4.4 3.7(L) 3.6(L)  RBC 3.87 - 5.11 MIL/uL 4.33 4.58 4.23  HGB 12.0 - 15.0 g/dL 13.2 14.9 14.2  HCT 36.0 - 46.0 % 39.8 42.7 41.0  PLT 150 - 400 K/uL 105(L) 96(L) 99(L)  NEUTROABS 1.7 - 7.7 K/uL 3.2 2.6 2.5  LYMPHSABS 0.7 - 4.0 K/uL 0.7 0.7 0.7     There is no height or weight on file to calculate BMI.  Orders:  Orders Placed This Encounter  Procedures  . XR Clavicle Right   Meds ordered this encounter  Medications  . doxycycline (VIBRA-TABS) 100 MG tablet    Sig: Take 1 tablet (100 mg total) by mouth 2 (two) times daily.    Dispense:  30 tablet    Refill:  0     Procedures: No procedures performed  Clinical Data: No additional findings.  ROS:  All other systems negative, except as noted in the HPI. Review of Systems  Objective: Vital Signs: There were no vitals taken for this visit.  Specialty Comments:  No specialty comments available.  PMFS History: Patient Active Problem List   Diagnosis Date Noted  . Hereditary hemochromatosis ([place]) 11/03/2017  . Ankle pain, chronic, left 03/15/2012   Past Medical History:  Diagnosis Date  . Diabetes mellitus without complication ([place])   . Hypertension   . Vitamin D deficiency 08/2017    Family History  Problem Relation Age of Onset  . Breast cancer Neg Hx     Past Surgical History:  Procedure Laterality Date  . FRACTURE SURGERY     . I & D EXTREMITY Right 07/01/2013   Procedure: IRRIGATION AND DEBRIDEMENT RIGHT LEG;  Surgeon: Newt Minion, MD;  Location: [place];  Service: Orthopedics;  Laterality: Right;  . ORIF FEMUR FRACTURE Right 07/01/2013   Procedure: OPEN REDUCTION INTERNAL FIXATION (ORIF) DISTAL FEMUR FRACTURE;  Surgeon: Newt Minion, MD;  Location: [place];  Service: Orthopedics;  Laterality: Right;   Social History   Occupational History  . Not on file  Tobacco Use  . Smoking status: Former Smoker    Packs/day: 1.00    Years: 44.00    Pack years: 44.00    Types: Cigarettes  . Smokeless tobacco: Never Used  Substance and Sexual Activity  . Alcohol use: Yes  . Drug use: Never  . Sexual activity: Not on file

## 2020-12-26 DIAGNOSIS — J449 Chronic obstructive pulmonary disease, unspecified: Secondary | ICD-10-CM | POA: Diagnosis not present

## 2020-12-26 DIAGNOSIS — E7800 Pure hypercholesterolemia, unspecified: Secondary | ICD-10-CM | POA: Diagnosis not present

## 2020-12-26 DIAGNOSIS — J45909 Unspecified asthma, uncomplicated: Secondary | ICD-10-CM | POA: Diagnosis not present

## 2020-12-26 DIAGNOSIS — I10 Essential (primary) hypertension: Secondary | ICD-10-CM | POA: Diagnosis not present

## 2020-12-30 ENCOUNTER — Other Ambulatory Visit: Payer: Self-pay | Admitting: Family Medicine

## 2020-12-30 DIAGNOSIS — M810 Age-related osteoporosis without current pathological fracture: Secondary | ICD-10-CM

## 2020-12-30 DIAGNOSIS — Z1231 Encounter for screening mammogram for malignant neoplasm of breast: Secondary | ICD-10-CM

## 2021-01-02 ENCOUNTER — Ambulatory Visit: Payer: Self-pay

## 2021-01-02 ENCOUNTER — Encounter: Payer: Self-pay | Admitting: Orthopedic Surgery

## 2021-01-02 ENCOUNTER — Other Ambulatory Visit: Payer: Self-pay

## 2021-01-02 ENCOUNTER — Ambulatory Visit: Payer: Medicare Other | Admitting: Orthopedic Surgery

## 2021-01-02 DIAGNOSIS — M545 Low back pain, unspecified: Secondary | ICD-10-CM

## 2021-01-02 DIAGNOSIS — M546 Pain in thoracic spine: Secondary | ICD-10-CM | POA: Diagnosis not present

## 2021-01-02 MED ORDER — PREDNISONE 10 MG PO TABS: 10.0000 mg | ORAL_TABLET | Freq: Every day | ORAL | 0 refills | Status: DC

## 2021-01-02 NOTE — Progress Notes (Signed)
Office Visit Note   Patient: [name]           Date of Birth: 1950/12/19           MRN: [id number] Visit Date: 01/02/2021              Requested by: Lawerance Cruel, [place],  [place] [address] PCP: Lawerance Cruel, MD  Chief Complaint  Patient presents with  . Right Shoulder - Follow-up    Abnormal clavicle prominence   . Lower Back - Pain  . Middle Back - Pain      HPI: Patient presents complaining of thoracic and lumbar spine pain she states she still has prominence of the clavicle at the sternoclavicular joint on the right.  She has completed her doxycycline she is not using the Voltaren gel.  Patient states that her chiropractor says cannot do anything for you.  She states occasionally has radiating pain down her legs she states that she has back pain after standing for greater than an hour and then has to sit down she complains of back pain aching in the morning.  She states she has had a history of sciatic symptoms denies a history of gout.  Assessment & Plan: Visit Diagnoses:  1. Pain in thoracic spine   2. Lumbar pain     Plan: Recommended prednisone 10 mg with breakfast and physical therapy for her back.  Reevaluate in 4 weeks.  Follow-Up Instructions: No follow-ups on file.   Ortho Exam  Patient is alert, oriented, no adenopathy, well-dressed, normal affect, normal respiratory effort. Examination patient has a normal gait she has a negative straight leg raise bilaterally no focal motor weakness in either lower extremity she has no sciatic symptoms at this time.  Imaging: XR Thoracic Spine 2 View  Result Date: 01/02/2021 2 view radiographs of the thoracic spine shows joint space narrowing and bony spurs consistent with degenerative disc disease  XR Lumbar Spine 2-3 Views  Result Date: 01/02/2021 2 view radiographs of the lumbar spine shows a calcified aorta without aneurysm she does have degenerative disc disease with  osteophytic bone spurs and joint space narrowing no pars defect  No images are attached to the encounter.  Labs: Lab Results  Component Value Date   ESRSEDRATE 101 (H) 06/06/2011   ESRSEDRATE 49 (H) 06/04/2011   LABURIC 3.7 06/04/2011   REPTSTATUS 07/02/2013 FINAL 07/01/2013   GRAMSTAIN  06/06/2011    NO ORGANISMS SEEN RARE WBC PRESENT, PREDOMINANTLY PMN Gram Stain Report Called to,Read Back By and Verified With: DR. Veverly Fells AT [place] [id number] BY LOVE,T.   CULT NO GROWTH Performed at [facility] 07/01/2013   [place] 06/06/2011     Lab Results  Component Value Date   ALBUMIN 4.2 07/23/2020   ALBUMIN 4.2 02/05/2020   ALBUMIN 4.0 07/01/2013    No results found for: MG No results found for: VD25OH  No results found for: PREALBUMIN CBC EXTENDED Latest Ref Rng & Units 07/23/2020 02/05/2020 10/02/2019  WBC 4.0 - 10.5 K/uL 4.4 3.7(L) 3.6(L)  RBC 3.87 - 5.11 MIL/uL 4.33 4.58 4.23  HGB 12.0 - 15.0 g/dL 13.2 14.9 14.2  HCT 36.0 - 46.0 % 39.8 42.7 41.0  PLT 150 - 400 K/uL 105(L) 96(L) 99(L)  NEUTROABS 1.7 - 7.7 K/uL 3.2 2.6 2.5  LYMPHSABS 0.7 - 4.0 K/uL 0.7 0.7 0.7     There is no height or weight on file to calculate BMI.  Orders:  Orders Placed This Encounter  Procedures  . XR Thoracic Spine 2 View  . XR Lumbar Spine 2-3 Views   No orders of the defined types were placed in this encounter.    Procedures: No procedures performed  Clinical Data: No additional findings.  ROS:  All other systems negative, except as noted in the HPI. Review of Systems  Objective: Vital Signs: There were no vitals taken for this visit.  Specialty Comments:  No specialty comments available.  PMFS History: Patient Active Problem List   Diagnosis Date Noted  . Hereditary hemochromatosis ([place]) 11/03/2017  . Ankle pain, chronic, left 03/15/2012   Past Medical History:  Diagnosis Date  . Diabetes mellitus without complication ([place])   . Hypertension    . Vitamin D deficiency 08/2017    Family History  Problem Relation Age of Onset  . Breast cancer Neg Hx     Past Surgical History:  Procedure Laterality Date  . FRACTURE SURGERY    . I & D EXTREMITY Right 07/01/2013   Procedure: IRRIGATION AND DEBRIDEMENT RIGHT LEG;  Surgeon: Newt Minion, MD;  Location: [place];  Service: Orthopedics;  Laterality: Right;  . ORIF FEMUR FRACTURE Right 07/01/2013   Procedure: OPEN REDUCTION INTERNAL FIXATION (ORIF) DISTAL FEMUR FRACTURE;  Surgeon: Newt Minion, MD;  Location: [place];  Service: Orthopedics;  Laterality: Right;   Social History   Occupational History  . Not on file  Tobacco Use  . Smoking status: Former Smoker    Packs/day: 1.00    Years: 44.00    Pack years: 44.00    Types: Cigarettes  . Smokeless tobacco: Never Used  Substance and Sexual Activity  . Alcohol use: Yes  . Drug use: Never  . Sexual activity: Not on file

## 2021-01-16 ENCOUNTER — Ambulatory Visit (INDEPENDENT_AMBULATORY_CARE_PROVIDER_SITE_OTHER): Payer: Medicare Other | Admitting: Orthopedic Surgery

## 2021-01-16 ENCOUNTER — Ambulatory Visit: Payer: Self-pay

## 2021-01-16 DIAGNOSIS — M25551 Pain in right hip: Secondary | ICD-10-CM

## 2021-01-16 DIAGNOSIS — S32501A Unspecified fracture of right pubis, initial encounter for closed fracture: Secondary | ICD-10-CM | POA: Diagnosis not present

## 2021-01-17 ENCOUNTER — Other Ambulatory Visit: Payer: Self-pay | Admitting: *Deleted

## 2021-01-21 ENCOUNTER — Inpatient Hospital Stay: Payer: Medicare Other | Attending: Hematology and Oncology

## 2021-01-21 ENCOUNTER — Other Ambulatory Visit: Payer: Self-pay

## 2021-01-21 ENCOUNTER — Encounter: Payer: Self-pay | Admitting: Orthopedic Surgery

## 2021-01-21 DIAGNOSIS — S32591A Other specified fracture of right pubis, initial encounter for closed fracture: Secondary | ICD-10-CM | POA: Insufficient documentation

## 2021-01-21 DIAGNOSIS — E669 Obesity, unspecified: Secondary | ICD-10-CM | POA: Diagnosis not present

## 2021-01-21 DIAGNOSIS — Z79899 Other long term (current) drug therapy: Secondary | ICD-10-CM | POA: Insufficient documentation

## 2021-01-21 DIAGNOSIS — R7989 Other specified abnormal findings of blood chemistry: Secondary | ICD-10-CM | POA: Diagnosis not present

## 2021-01-21 LAB — CMP (CANCER CENTER ONLY)
ALT: 126 U/L — ABNORMAL HIGH (ref 0–44)
AST: 102 U/L — ABNORMAL HIGH (ref 15–41)
Albumin: 4.2 g/dL (ref 3.5–5.0)
Alkaline Phosphatase: 165 U/L — ABNORMAL HIGH (ref 38–126)
Anion gap: 14 (ref 5–15)
BUN: 16 mg/dL (ref 8–23)
CO2: 22 mmol/L (ref 22–32)
Calcium: 10.1 mg/dL (ref 8.9–10.3)
Chloride: 102 mmol/L (ref 98–111)
Creatinine: 0.8 mg/dL (ref 0.44–1.00)
GFR, Estimated: >60 mL/min (ref >60)
Glucose, Bld: 201 mg/dL — ABNORMAL HIGH (ref 70–99)
Potassium: 4.6 mmol/L (ref 3.5–5.1)
Sodium: 138 mmol/L (ref 135–145)
Total Bilirubin: 0.8 mg/dL (ref 0.3–1.2)
Total Protein: 6.8 g/dL (ref 6.5–8.1)

## 2021-01-21 LAB — CBC WITH DIFFERENTIAL (CANCER CENTER ONLY)
Abs Immature Granulocytes: 0.05 10*3/uL (ref 0.00–0.07)
Basophils Absolute: 0 10*3/uL (ref 0.0–0.1)
Basophils Relative: 1 %
Eosinophils Absolute: 0.2 10*3/uL (ref 0.0–0.5)
Eosinophils Relative: 3 %
HCT: 43.2 % (ref 36.0–46.0)
Hemoglobin: 14.8 g/dL (ref 12.0–15.0)
Immature Granulocytes: 1 %
Lymphocytes Relative: 13 %
Lymphs Abs: 0.7 10*3/uL (ref 0.7–4.0)
MCH: 31.4 pg (ref 26.0–34.0)
MCHC: 34.3 g/dL (ref 30.0–36.0)
MCV: 91.5 fL (ref 80.0–100.0)
Monocytes Absolute: 0.4 10*3/uL (ref 0.1–1.0)
Monocytes Relative: 7 %
Neutro Abs: 4.2 10*3/uL (ref 1.7–7.7)
Neutrophils Relative %: 75 %
Platelet Count: 113 10*3/uL — ABNORMAL LOW (ref 150–400)
RBC: 4.72 MIL/uL (ref 3.87–5.11)
RDW: 13.6 % (ref 11.5–15.5)
WBC Count: 5.5 10*3/uL (ref 4.0–10.5)
nRBC: 0 % (ref 0.0–0.2)

## 2021-01-21 LAB — IRON AND TIBC
Iron: 121 ug/dL (ref 41–142)
Saturation Ratios: 33 % (ref 21–57)
TIBC: 369 ug/dL (ref 236–444)
UIBC: 248 ug/dL (ref 120–384)

## 2021-01-21 LAB — FERRITIN: Ferritin: 105 ng/mL (ref 11–307)

## 2021-01-21 NOTE — Progress Notes (Signed)
Office Visit Note   Patient: [name]           Date of Birth: [birth year]           MRN: [id number] Visit Date: 01/16/2021              Requested by: Lawerance Cruel, [place],  [place] [address] PCP: Lawerance Cruel, MD  Chief Complaint  Patient presents with  . Right Leg - Pain    Inner thigh pain      HPI: Patient is a 70 year old woman who states that she was standing in a chair and fell states she feels like she pulled a muscle on her inner groin on the right injury occurred last Thursday she has been taking Aleve without relief.  Assessment & Plan: Visit Diagnoses:  1. Pain in right hip   2. Traumatic closed nondisplaced fracture of pubis, right, initial encounter [facility])     Plan: Nonoperative intervention she will continue with her vitamin D3 5000 international units a day and will continue with her Fosamax.  Discussed using hopping for increasing bone density once the fracture has healed.  AP pelvis x-ray at follow-up  Follow-Up Instructions: Return in about 4 weeks (around 02/13/2021).   Ortho Exam  Patient is alert, oriented, no adenopathy, well-dressed, normal affect, normal respiratory effort. Examination patient has no pain with range of motion of the hip knee or ankle she has a negative straight leg raise.  Patient is most pain reproduced with sitting.  Radiographs shows a nondisplaced inferior pubic rami fracture.  Imaging: No results found. No images are attached to the encounter.  Labs: Lab Results  Component Value Date   ESRSEDRATE 101 (H) 06/06/2011   ESRSEDRATE 49 (H) 06/04/2011   LABURIC 3.7 06/04/2011   REPTSTATUS 07/02/2013 FINAL 07/01/2013   GRAMSTAIN  06/06/2011    NO ORGANISMS SEEN RARE WBC PRESENT, PREDOMINANTLY PMN Gram Stain Report Called to,Read Back By and Verified With: DR. Veverly Fells AT [place] BY LOVE,T.   CULT NO GROWTH Performed at [facility] 07/01/2013   LABORGA STAPHYLOCOCCUS  AUREUS 06/06/2011     Lab Results  Component Value Date   ALBUMIN 4.2 07/23/2020   ALBUMIN 4.2 02/05/2020   ALBUMIN 4.0 07/01/2013    No results found for: MG No results found for: VD25OH  No results found for: PREALBUMIN CBC EXTENDED Latest Ref Rng & Units 01/21/2021 07/23/2020 02/05/2020  WBC 4.0 - 10.5 K/uL 5.5 4.4 3.7(L)  RBC 3.87 - 5.11 MIL/uL 4.72 4.33 4.58  HGB 12.0 - 15.0 g/dL 14.8 13.2 14.9  HCT 36.0 - 46.0 % 43.2 39.8 42.7  PLT 150 - 400 K/uL 113(L) 105(L) 96(L)  NEUTROABS 1.7 - 7.7 K/uL 4.2 3.2 2.6  LYMPHSABS 0.7 - 4.0 K/uL 0.7 0.7 0.7     There is no height or weight on file to calculate BMI.  Orders:  Orders Placed This Encounter  Procedures  . XR HIP UNILAT W OR W/O PELVIS 2-3 VIEWS RIGHT   No orders of the defined types were placed in this encounter.    Procedures: No procedures performed  Clinical Data: No additional findings.  ROS:  All other systems negative, except as noted in the HPI. Review of Systems  Objective: Vital Signs: There were no vitals taken for this visit.  Specialty Comments:  No specialty comments available.  PMFS History: Patient Active Problem List   Diagnosis Date Noted  . Hereditary hemochromatosis ([place])  11/03/2017  . Ankle pain, chronic, left 03/15/2012   Past Medical History:  Diagnosis Date  . Diabetes mellitus without complication ([place])   . Hypertension   . Vitamin D deficiency 08/2017    Family History  Problem Relation Age of Onset  . Breast cancer Neg Hx     Past Surgical History:  Procedure Laterality Date  . FRACTURE SURGERY    . I & D EXTREMITY Right 07/01/2013   Procedure: IRRIGATION AND DEBRIDEMENT RIGHT LEG;  Surgeon: Newt Minion, MD;  Location: [place];  Service: Orthopedics;  Laterality: Right;  . ORIF FEMUR FRACTURE Right 07/01/2013   Procedure: OPEN REDUCTION INTERNAL FIXATION (ORIF) DISTAL FEMUR FRACTURE;  Surgeon: Newt Minion, MD;  Location: [place];  Service: Orthopedics;  Laterality:  Right;   Social History   Occupational History  . Not on file  Tobacco Use  . Smoking status: Former Smoker    Packs/day: 1.00    Years: 44.00    Pack years: 44.00    Types: Cigarettes  . Smokeless tobacco: Never Used  Substance and Sexual Activity  . Alcohol use: Yes  . Drug use: Never  . Sexual activity: Not on file

## 2021-01-22 NOTE — Progress Notes (Signed)
Patient Care Team: Lawerance Cruel, MD as PCP - General (Family Medicine) Lavonna Monarch, MD as Consulting Physician (Dermatology)  DIAGNOSIS:    ICD-10-CM   1. Hereditary hemochromatosis ([place])  E83.110     CHIEF COMPLIANT: Follow-up of hereditary hemochromatosis  INTERVAL HISTORY: [name] is a 70 y.o. with above-mentioned history of hereditary hemochromatosis on phlebotomiesevery6 weeks, most recently8/25/21. Her most recent labs from4/5/22 showed: WBC5.5, Hg 14.8, HCT 43.2, platelets 113, creatinine 0.80, AST 102, ALT 126, iron saturation33%, 105. She presentsto the clinic today to review her recent labs. She is having difficulty walking because of her recent fall and fracture of the right inferior pubic ramus.  ALLERGIES:  is allergic to codeine.  MEDICATIONS:  Current Outpatient Medications  Medication Sig Dispense Refill  . alendronate (FOSAMAX) 70 MG tablet Take 1 tablet (70 mg total) by mouth once a week. Take with a full glass of water on an empty stomach.    Marland Kitchen amLODipine (NORVASC) 10 MG tablet Take 10 mg by mouth daily.    . cholecalciferol (VITAMIN D) 1000 units tablet Take 5,000 Units by mouth daily.    Marland Kitchen doxycycline (VIBRA-TABS) 100 MG tablet Take 1 tablet (100 mg total) by mouth 2 (two) times daily. 30 tablet 0  . losartan (COZAAR) 100 MG tablet Take 100 mg by mouth daily.    . pravastatin (PRAVACHOL) 40 MG tablet Take 40 mg by mouth daily.    . predniSONE (DELTASONE) 10 MG tablet Take 1 tablet (10 mg total) by mouth daily with breakfast. 30 tablet 0  . sertraline (ZOLOFT) 100 MG tablet     . TRELEGY ELLIPTA 100-62.5-25 MCG/INH AEPB      No current facility-administered medications for this visit.    PHYSICAL EXAMINATION: ECOG PERFORMANCE STATUS: 1 - Symptomatic but completely ambulatory  Vitals:   01/23/21 0946  BP: 119/73  Pulse: 93  Resp: 19  Temp: (!) 97.5 F (36.4 C)  SpO2: 97%   Filed Weights   01/23/21 0946  Weight: 162 lb 8 oz  (73.7 kg)    LABORATORY DATA:  I have reviewed the data as listed CMP Latest Ref Rng & Units 01/21/2021 07/23/2020 02/05/2020  Glucose 70 - 99 mg/dL 201(H) 165(H) 203(H)  BUN 8 - 23 mg/dL 16 15 12   Creatinine 0.44 - 1.00 mg/dL 0.80 0.69 0.74  Sodium 135 - 145 mmol/L 138 141 136  Potassium 3.5 - 5.1 mmol/L 4.6 4.9 4.4  Chloride 98 - 111 mmol/L 102 104 102  CO2 22 - 32 mmol/L 22 29 26   Calcium 8.9 - 10.3 mg/dL 10.1 9.5 9.9  Total Protein 6.5 - 8.1 g/dL 6.8 6.9 6.9  Total Bilirubin 0.3 - 1.2 mg/dL 0.8 0.7 0.7  Alkaline Phos 38 - 126 U/L 165(H) 116 122  AST 15 - 41 U/L 102(H) 94(H) 70(H)  ALT 0 - 44 U/L 126(H) 103(H) 89(H)    Lab Results  Component Value Date   WBC 5.5 01/21/2021   HGB 14.8 01/21/2021   HCT 43.2 01/21/2021   MCV 91.5 01/21/2021   PLT 113 (L) 01/21/2021   NEUTROABS 4.2 01/21/2021    ASSESSMENT & PLAN:  Hereditary hemochromatosis ([place]) Elevated liver function tests with elevated ferritin 08/2017 Ferritin : 918.9, iron saturation 43% 01/17/2018 ferritin 124, iron saturation 22% 05/30/2018: Ferritin 108, iron saturation 34% 09/21/2018: Ferritin 24, iron saturation 24% 04/05/2019:Ferritin 180, iron saturation 38% 06/29/2019: Ferritin 169, iron saturation 31% 10/02/2019: Ferritin 102, iron saturation 28% 02/05/2020: Ferritin 71, iron saturation  20% 07/23/2020: Ferritin 41, iron saturation 13% 01/23/2021: Ferritin 105, iron saturation 33%, AST 102, ALT 126, alkaline phosphatase 165, bilirubin 0.8  Phlebotomy summary: 1.Phlebotomy weeklyfor 4 weeks completed 12/07/17 2.Phlebotomy every 2 weeks x4 completed 01/31/2018 3.Monthly phlebotomiesuntil November 2019,switched to every 73-month phlebotomies until 04/06/2019, switched to every 6 weeks starting 10/04/2019.   It has been 2 and half months since the last phlebotomy and we are seeing an increase in the liver function tests. Therefore we will go back to every 6-week phlebotomies.  Obesity: She fractured her right  inferior pubic ramus and because of that her ambulation has decreased and since then she has gained more weight.  Follow-up every 6 weeks with labs and phlebotomy.  I will see her the following day with a MyChart virtual visit.     No orders of the defined types were placed in this encounter.  The patient has a good understanding of the overall plan. she agrees with it. she will call with any problems that may develop before the next visit here.  Total time spent: 30 mins including face to face time and time spent for planning, charting and coordination of care  Rulon Eisenmenger, MD, MPH 01/23/2021  I, Molly Dorshimer, am acting as scribe for Dr. Nicholas Lose.  I have reviewed the above documentation for accuracy and completeness, and I agree with the above.

## 2021-01-22 NOTE — Assessment & Plan Note (Signed)
Elevated liver function tests with elevated ferritin 08/2017 Ferritin : 918.9, iron saturation 43% 01/17/2018 ferritin 124, iron saturation 22% 05/30/2018: Ferritin 108, iron saturation 34% 09/21/2018: Ferritin 24, iron saturation 24% 04/05/2019:Ferritin 180, iron saturation 38% 06/29/2019: Ferritin 169, iron saturation 31% 10/02/2019: Ferritin 102, iron saturation 28% 02/05/2020: Ferritin 71, iron saturation 20% 07/23/2020: Ferritin 41, iron saturation 13%  Phlebotomy summary: 1.Phlebotomy weeklyfor 4 weeks completed 12/07/17 2.Phlebotomy every 2 weeks x4 completed 01/31/2018 3.Monthly phlebotomiesuntil November 2019,switched to every 31-month phlebotomies until 04/06/2019, switched to every 6 weeks starting 10/04/2019.  Switching to phlebotomies every 8 weeks  Obesity: I discussed with her about different ways to lose weight.  Follow-up in40months with labs done ahead of time.

## 2021-01-23 ENCOUNTER — Inpatient Hospital Stay: Payer: Medicare Other | Admitting: Hematology and Oncology

## 2021-01-23 ENCOUNTER — Other Ambulatory Visit: Payer: Self-pay

## 2021-01-23 ENCOUNTER — Telehealth: Payer: Self-pay | Admitting: Hematology and Oncology

## 2021-01-23 DIAGNOSIS — Z79899 Other long term (current) drug therapy: Secondary | ICD-10-CM | POA: Diagnosis not present

## 2021-01-23 DIAGNOSIS — S32591A Other specified fracture of right pubis, initial encounter for closed fracture: Secondary | ICD-10-CM | POA: Diagnosis not present

## 2021-01-23 DIAGNOSIS — E669 Obesity, unspecified: Secondary | ICD-10-CM | POA: Diagnosis not present

## 2021-01-23 DIAGNOSIS — R7989 Other specified abnormal findings of blood chemistry: Secondary | ICD-10-CM | POA: Diagnosis not present

## 2021-01-23 NOTE — Telephone Encounter (Signed)
Scheduled appts per 4/7 los. Pt aware.

## 2021-01-24 ENCOUNTER — Other Ambulatory Visit: Payer: Medicare Other

## 2021-01-24 ENCOUNTER — Other Ambulatory Visit: Payer: Self-pay | Admitting: Hematology and Oncology

## 2021-01-24 ENCOUNTER — Inpatient Hospital Stay: Payer: Medicare Other

## 2021-01-24 NOTE — Progress Notes (Signed)
[name] presents today for phlebotomy per MD orders. Phlebotomy procedure started at 1350 and ended at 1405. 516 grams removed. Patient observed for 30 minutes after procedure without any incident. Patient tolerated procedure well. IV needle removed intact.

## 2021-01-24 NOTE — Patient Instructions (Signed)
Therapeutic Phlebotomy Therapeutic phlebotomy is the planned removal of blood from a person's body for the purpose of treating a medical condition. The procedure is similar to donating blood. Usually, about a pint (470 mL, or 0.47 L) of blood is removed. The average adult has 9-12 pints (4.3-5.7 L) of blood in the body. Therapeutic phlebotomy may be used to treat the following medical conditions:  Hemochromatosis. This is a condition in which the blood contains too much iron.  Polycythemia vera. This is a condition in which the blood contains too many red blood cells.  Porphyria cutanea tarda. This is a disease in which an important part of hemoglobin is not made properly. It results in the buildup of abnormal amounts of porphyrins in the body.  Sickle cell disease. This is a condition in which the red blood cells form an abnormal crescent shape rather than a round shape. Tell a health care provider about:  Any allergies you have.  All medicines you are taking, including vitamins, herbs, eye drops, creams, and over-the-counter medicines.  Any problems you or family members have had with anesthetic medicines.  Any blood disorders you have.  Any surgeries you have had.  Any medical conditions you have.  Whether you are pregnant or may be pregnant. What are the risks? Generally, this is a safe procedure. However, problems may occur, including:  Nausea or light-headedness.  Low blood pressure (hypotension).  Soreness, bleeding, swelling, or bruising at the needle insertion site.  Infection. What happens before the procedure?  Follow instructions from your health care provider about eating or drinking restrictions.  Ask your health care provider about: ? Changing or stopping your regular medicines. This is especially important if you are taking diabetes medicines or blood thinners (anticoagulants). ? Taking medicines such as aspirin and ibuprofen. These medicines can thin your  blood. Do not take these medicines unless your health care provider tells you to take them. ? Taking over-the-counter medicines, vitamins, herbs, and supplements.  Wear clothing with sleeves that can be raised above the elbow.  Plan to have someone take you home from the hospital or clinic.  You may have a blood sample taken.  Your blood pressure, pulse rate, and breathing rate will be measured. What happens during the procedure?  To lower your risk of infection: ? Your health care team will wash or sanitize their hands. ? Your skin will be cleaned with an antiseptic.  You may be given a medicine to numb the area (local anesthetic).  A tourniquet will be placed on your arm.  A needle will be inserted into one of your veins.  Tubing and a collection bag will be attached to that needle.  Blood will flow through the needle and tubing into the collection bag.  The collection bag will be placed lower than your arm to allow gravity to help the flow of blood into the bag.  You may be asked to open and close your hand slowly and continually during the entire collection.  After the specified amount of blood has been removed from your body, the collection bag and tubing will be clamped.  The needle will be removed from your vein.  Pressure will be held on the site of the needle insertion to stop the bleeding.  A bandage (dressing) will be placed over the needle insertion site. The procedure may vary among health care providers and hospitals.   What happens after the procedure?  Your blood pressure, pulse rate, and breathing rate will   be measured after the procedure.  You will be encouraged to drink fluids.  Your recovery will be assessed and monitored.  You can return to your normal activities as told by your health care provider. Summary  Therapeutic phlebotomy is the planned removal of blood from a person's body for the purpose of treating a medical condition.  Therapeutic  phlebotomy may be used to treat hemochromatosis, polycythemia vera, porphyria cutanea tarda, or sickle cell disease.  In the procedure, a needle is inserted and about a pint (470 mL, or 0.47 L) of blood is removed. The average adult has 9-12 pints (4.3-5.7 L) of blood in the body.  This is generally a safe procedure, but it can sometimes cause problems such as nausea, light-headedness, or low blood pressure (hypotension). This information is not intended to replace advice given to you by your health care provider. Make sure you discuss any questions you have with your health care provider. Document Revised: 10/21/2017 Document Reviewed: 10/21/2017 Elsevier Patient Education  2021 Elsevier Inc.  

## 2021-01-30 ENCOUNTER — Other Ambulatory Visit: Payer: Self-pay | Admitting: Orthopedic Surgery

## 2021-01-30 NOTE — Telephone Encounter (Signed)
Not sure ok to refill? Just seen for pubic fx

## 2021-02-04 ENCOUNTER — Ambulatory Visit: Payer: Medicare Other | Admitting: Physician Assistant

## 2021-02-13 ENCOUNTER — Ambulatory Visit (INDEPENDENT_AMBULATORY_CARE_PROVIDER_SITE_OTHER): Payer: Medicare Other

## 2021-02-13 ENCOUNTER — Encounter: Payer: Self-pay | Admitting: Orthopedic Surgery

## 2021-02-13 ENCOUNTER — Ambulatory Visit (INDEPENDENT_AMBULATORY_CARE_PROVIDER_SITE_OTHER): Payer: Medicare Other | Admitting: Orthopedic Surgery

## 2021-02-13 DIAGNOSIS — S32501A Unspecified fracture of right pubis, initial encounter for closed fracture: Secondary | ICD-10-CM

## 2021-02-13 NOTE — Progress Notes (Signed)
Office Visit Note   Patient: [name]           Date of Birth: 02-03-1951           MRN: [id number] Visit Date: 02/13/2021              Requested by: Lawerance Cruel, [place],  [place] [address] PCP: Lawerance Cruel, MD  Chief Complaint  Patient presents with  . Pelvis - Follow-up    DOI 01/09/21 nondisplaced inferior pubic rami fx      HPI: Patient is a 70 year old woman who presents with several medical issues.  #1 she still has weakness in her left lower extremity difficulty walking up the stairs.  Patient is also status post an inferior pubic rami fracture on the right she states that while she is not using her cane at this time she can sit comfortably but has pain with walking.  Assessment & Plan: Visit Diagnoses:  1. Traumatic closed nondisplaced fracture of pubis, right, initial encounter [facility])     Plan: We will set her up with physical therapy for strengthening for her legs as well as for her chronic back pain.  She will continue with her vitamin D vitamin D magnesium and vitamin C supplements.  Also recommended a protein supplement.  Repeat AP pelvis at follow-up in 4 weeks.  Follow-Up Instructions: No follow-ups on file.   Ortho Exam  Patient is alert, oriented, no adenopathy, well-dressed, normal affect, normal respiratory effort. Examination patient has an antalgic gait she walks with a stifflegged gait.  She has no pain with sitting she does have pain reproduced with walking.  Radiograph shows no displacement of the inferior pubic rami fracture on the right.  Imaging: XR Pelvis 1-2 Views  Result Date: 02/13/2021 AP pelvis shows a nondisplaced inferior pubic rami fracture on the right no callus formation.  No images are attached to the encounter.  Labs: Lab Results  Component Value Date   ESRSEDRATE 101 (H) 06/06/2011   ESRSEDRATE 49 (H) 06/04/2011   LABURIC 3.7 06/04/2011   REPTSTATUS 07/02/2013 FINAL 07/01/2013    GRAMSTAIN  06/06/2011    NO ORGANISMS SEEN RARE WBC PRESENT, PREDOMINANTLY PMN Gram Stain Report Called to,Read Back By and Verified With: DR. Veverly Fells AT [place] [facility],T.   CULT NO GROWTH Performed at [facility] 07/01/2013   LABORGA STAPHYLOCOCCUS AUREUS 06/06/2011     Lab Results  Component Value Date   ALBUMIN 4.2 01/21/2021   ALBUMIN 4.2 07/23/2020   ALBUMIN 4.2 02/05/2020    No results found for: MG No results found for: VD25OH  No results found for: PREALBUMIN CBC EXTENDED Latest Ref Rng & Units 01/21/2021 07/23/2020 02/05/2020  WBC 4.0 - 10.5 K/uL 5.5 4.4 3.7(L)  RBC 3.87 - 5.11 MIL/uL 4.72 4.33 4.58  HGB 12.0 - 15.0 g/dL 14.8 13.2 14.9  HCT 36.0 - 46.0 % 43.2 39.8 42.7  PLT 150 - 400 K/uL 113(L) 105(L) 96(L)  NEUTROABS 1.7 - 7.7 K/uL 4.2 3.2 2.6  LYMPHSABS 0.7 - 4.0 K/uL 0.7 0.7 0.7     There is no height or weight on file to calculate BMI.  Orders:  Orders Placed This Encounter  Procedures  . XR Pelvis 1-2 Views  . Ambulatory referral to Physical Therapy   No orders of the defined types were placed in this encounter.    Procedures: No procedures performed  Clinical Data: No additional findings.  ROS:  All  other systems negative, except as noted in the HPI. Review of Systems  Objective: Vital Signs: There were no vitals taken for this visit.  Specialty Comments:  No specialty comments available.  PMFS History: Patient Active Problem List   Diagnosis Date Noted  . Hereditary hemochromatosis ([place]) 11/03/2017  . Ankle pain, chronic, left 03/15/2012   Past Medical History:  Diagnosis Date  . Diabetes mellitus without complication ([place])   . Hypertension   . Vitamin D deficiency 08/2017    Family History  Problem Relation Age of Onset  . Breast cancer Neg Hx     Past Surgical History:  Procedure Laterality Date  . FRACTURE SURGERY    . I & D EXTREMITY Right 07/01/2013   Procedure: IRRIGATION AND DEBRIDEMENT RIGHT LEG;   Surgeon: Newt Minion, MD;  Location: [place];  Service: Orthopedics;  Laterality: Right;  . ORIF FEMUR FRACTURE Right 07/01/2013   Procedure: OPEN REDUCTION INTERNAL FIXATION (ORIF) DISTAL FEMUR FRACTURE;  Surgeon: Newt Minion, MD;  Location: [place];  Service: Orthopedics;  Laterality: Right;   Social History   Occupational History  . Not on file  Tobacco Use  . Smoking status: Former Smoker    Packs/day: 1.00    Years: 44.00    Pack years: 44.00    Types: Cigarettes  . Smokeless tobacco: Never Used  Substance and Sexual Activity  . Alcohol use: Yes  . Drug use: Never  . Sexual activity: Not on file

## 2021-02-25 ENCOUNTER — Other Ambulatory Visit: Payer: Self-pay

## 2021-02-25 ENCOUNTER — Encounter: Payer: Self-pay | Admitting: Physical Therapy

## 2021-02-25 ENCOUNTER — Ambulatory Visit (INDEPENDENT_AMBULATORY_CARE_PROVIDER_SITE_OTHER): Payer: Medicare Other | Admitting: Physical Therapy

## 2021-02-25 DIAGNOSIS — R2689 Other abnormalities of gait and mobility: Secondary | ICD-10-CM | POA: Diagnosis not present

## 2021-02-25 DIAGNOSIS — M545 Low back pain, unspecified: Secondary | ICD-10-CM | POA: Diagnosis not present

## 2021-02-25 DIAGNOSIS — M6281 Muscle weakness (generalized): Secondary | ICD-10-CM | POA: Diagnosis not present

## 2021-02-25 DIAGNOSIS — G8929 Other chronic pain: Secondary | ICD-10-CM

## 2021-02-25 NOTE — Therapy (Signed)
[facility] Physical Therapy [address] [place], [place], [address] Phone: [phone]   Fax:  [phone]  Physical Therapy Evaluation  Patient Details  Name: [name] MRN: [id number] Date of Birth: May 05, 1951 Referring Provider (PT): Dr Harless Nakayama   Encounter Date: 02/25/2021   PT End of Session - 02/25/21 1137    Visit Number 1    Number of Visits 8    Date for PT Re-Evaluation 03/25/21    Authorization Type BCBS    PT Start Time 2707    PT Stop Time 1236    PT Time Calculation (min) 55 min    Activity Tolerance Patient tolerated treatment well    Behavior During Therapy [facility] for tasks assessed/performed           Past Medical History:  Diagnosis Date  . Diabetes mellitus without complication ([place])   . Hypertension   . Vitamin D deficiency 08/2017    Past Surgical History:  Procedure Laterality Date  . FRACTURE SURGERY    . I & D EXTREMITY Right 07/01/2013   Procedure: IRRIGATION AND DEBRIDEMENT RIGHT LEG;  Surgeon: Newt Minion, MD;  Location: [place];  Service: Orthopedics;  Laterality: Right;  . ORIF FEMUR FRACTURE Right 07/01/2013   Procedure: OPEN REDUCTION INTERNAL FIXATION (ORIF) DISTAL FEMUR FRACTURE;  Surgeon: Newt Minion, MD;  Location: [place];  Service: Orthopedics;  Laterality: Right;    There were no vitals filed for this visit.    Subjective Assessment - 02/25/21 1142    Subjective Pt reports in 2014 had a Rt femur fx with ORIF, since this she has not been able to alternate legs going up/down stairs.  She has to walk sideways. Her son has been getting on her to go to PT to help with this.  Since this the low back has become painful and it limits her ability to stand.  3 wks ago she stood up in a chair to fix and item and fell off the chair.  Has inferior pelvic ramis fx Rt.  The recent xray show it is partailly healed.    Diagnostic tests xrays    Patient Stated Goals go up/down stairs like a regular person, not have as  much pain in her back with daily actiivty    Currently in Pain? No/denies   back can go up to 8/10 with standing/walking, sitting will relieve it.             [facility] PT Assessment - 02/25/21 0001      Assessment   Medical Diagnosis Nondisplaced fx rt pubis and chronic LBP    Referring Provider (PT) Dr Harless Nakayama    Onset Date/Surgical Date 01/14/21    Hand Dominance Right    Next MD Visit in 2 wks    Prior Therapy not for back and pelvis, did have it after femur fx      Precautions   Precautions None      Balance Screen   Has the patient fallen in the past 6 months Yes    How many times? 1    Has the patient had a decrease in activity level because of a fear of falling?  Yes    Is the patient reluctant to leave their home because of a fear of falling?  No      Home Environment   Living Environment Private residence    Living Arrangements Spouse/significant other    [place] to enter   with  railing   Home Layout Two level      Prior Function   Level of Independence Independent    Vocation Retired    Leisure work in garden,      Observation/Other Assessments   Focus on Therapeutic Outcomes (FOTO)  17 ( goal 47)      Functional Tests   Functional tests Squat;Single leg stance      Squat   Comments Rt LE adducted, wt shift to left      Single Leg Stance   Comments Lt 5 sec, Rt 2 sec with LOB      Posture/Postural Control   Posture/Postural Control Postural limitations    Postural Limitations Decreased lumbar lordosis;Decreased thoracic kyphosis;Forward head   wide shoulders, narrow hips     ROM / Strength   AROM / PROM / Strength AROM;Strength      AROM   AROM Assessment Site Hip;Lumbar    Lumbar Flexion To floor    Lumbar Extension 50 present - all thoracic motion    Lumbar - Right Rotation WNL    Lumbar - Left Rotation WNL - pain in Rt low back      Strength   Strength Assessment Site Hip;Knee;Ankle    Right/Left Hip --   Lt 5/5, Rt flex 5/5,  abduction and ext 4/5 with some pain   Right/Left Knee --   Lt 5/5, Rt 4+/5   Right/Left Ankle --   5/5     Flexibility   Soft Tissue Assessment /Muscle Length yes   hamstrings and hip rotators WNL   Quadriceps prone knee flex LT 127, Rt 112    ITB tight Rt side      Palpation   Spinal mobility hypomobile in lumbar spine and pelvis    SI assessment  hypomobile    Palpation comment tight in bilat lumbar paraspinals - no tendernress                      Objective measurements completed on examination: See above findings.       [facility] Adult PT Treatment/Exercise - 02/25/21 0001      Exercises   Exercises Lumbar      Lumbar Exercises: Stretches   Lower Trunk Rotation --   10 reps in 90/90     Lumbar Exercises: Seated   Other Seated Lumbar Exercises 10 reps sit to stand      Lumbar Exercises: Supine   Pelvic Tilt 10 reps    Pelvic Tilt Limitations very difficult    Bridge 10 reps   with blue band around knees     Lumbar Exercises: Quadruped   [place] 10 reps   VC for form   Other Quadruped Lumbar Exercises attempted childs pose - didn't feel any stretch - very tight                  PT Education - 02/25/21 1243    Education Details POC, HEP    Person(s) Educated Patient    Methods Explanation;Demonstration;Handout    Comprehension Returned demonstration;Verbalized understanding               PT Long Term Goals - 02/25/21 1251      PT LONG TERM GOAL #1   Title I with advanced HEP    Time 4    Period Weeks    Status New    Target Date 04/08/21      PT LONG TERM GOAL #2  Title improve FOTO =/> 67    Time 4    Period Weeks    Status New    Target Date 03/25/21      PT LONG TERM GOAL #3   Title increase Rt hip strength =/> 5/5 to allow alternating steps on stairs    Time 4    Period Weeks    Status New    Target Date 03/25/21      PT LONG TERM GOAL #4   Title demo good pelvic mobility to assist with stairs    Time 4     Period Weeks    Status New    Target Date 03/25/21      PT LONG TERM GOAL #5   Title report =/> 75% reduction of back pain with daily acitivty    Time 4    Period Weeks    Status New    Target Date 03/25/21      Additional Long Term Goals   Additional Long Term Goals Yes      PT LONG TERM GOAL #6   Title report =/> 50% reduction in fear of falling    Time 4    Period Weeks    Status New    Target Date 03/25/21                  Plan - 02/25/21 1244    Clinical Impression Statement 70 yo female s/p a fall off a chair and sustaining a Rt pubic rami fx. She also has an old femur fx that she has never completed recovered from.  She also has developed low back recently.  Pt is very hypomobile in her pelvis and lumbar spine with minimal motion, she has palpable muscular tightness however no reports of tenderness.  She has weakness in the Rt hip abductors and extenders.  She would benefit from PT to loosen up her spine and pelvis, strengthen her hips and core and assist with decreasing pain and her fear of falling.    Personal Factors and Comorbidities Comorbidity 2    Examination-Activity Limitations Squat;Stairs    Examination-Participation Restrictions Other    Stability/Clinical Decision Making Stable/Uncomplicated    Clinical Decision Making Low    Rehab Potential Good    PT Frequency 2x / week    PT Duration 4 weeks    PT Treatment/Interventions Stair training;Taping;Patient/family education;Functional mobility training;Moist Heat;Traction;Passive range of motion;Therapeutic exercise;Cryotherapy;Electrical Stimulation;Neuromuscular re-education;Balance training;Manual techniques;Dry needling;Spinal Manipulations    PT Next Visit Plan spinal mobs, work on pelvic mobility, hip and core stability    PT Home Exercise Plan [email or web address]    Consulted and Agree with Plan of Care Patient           Patient will benefit from skilled therapeutic intervention in order to improve the  following deficits and impairments:  Decreased range of motion,Pain,Hypomobility,Postural dysfunction,Decreased strength  Visit Diagnosis: Muscle weakness (generalized) - Plan: PT plan of care cert/re-cert  Chronic midline low back pain without sciatica - Plan: PT plan of care cert/re-cert  Other abnormalities of gait and mobility - Plan: PT plan of care cert/re-cert     Problem List Patient Active Problem List   Diagnosis Date Noted  . Hereditary hemochromatosis ([place]) 11/03/2017  . Ankle pain, chronic, left 03/15/2012    Jeral Pinch PT 02/25/2021, 12:57 PM  [facility] Physical Therapy [address] [place], [place], [address] Phone: [phone]   Fax:  [phone]  Name: [name] MRN: [id number] Date of Birth:  11/15/1950  

## 2021-02-25 NOTE — Patient Instructions (Signed)
Access Code: [email or web address] URL: [email or web address] Date: 02/25/2021 Prepared by: Jeral Pinch  Exercises Cat Cow - 2 x daily - 10 reps Supine 90/90 Lower Trunk Rotation - 2 x daily - 10 reps Supine Bridge with Resistance Band - 1 x daily - 3 sets - 10 reps Supine Posterior Pelvic Tilt - 1 x daily - 3 sets - 10 reps Sit to Stand Without Arm Support - 1 x daily - 3 sets - 10 reps

## 2021-02-27 ENCOUNTER — Encounter: Payer: Self-pay | Admitting: Physical Therapy

## 2021-02-27 ENCOUNTER — Ambulatory Visit (INDEPENDENT_AMBULATORY_CARE_PROVIDER_SITE_OTHER): Payer: Medicare Other | Admitting: Physical Therapy

## 2021-02-27 ENCOUNTER — Other Ambulatory Visit: Payer: Self-pay

## 2021-02-27 DIAGNOSIS — M6281 Muscle weakness (generalized): Secondary | ICD-10-CM | POA: Diagnosis not present

## 2021-02-27 DIAGNOSIS — R2689 Other abnormalities of gait and mobility: Secondary | ICD-10-CM | POA: Diagnosis not present

## 2021-02-27 DIAGNOSIS — G8929 Other chronic pain: Secondary | ICD-10-CM

## 2021-02-27 DIAGNOSIS — M545 Low back pain, unspecified: Secondary | ICD-10-CM | POA: Diagnosis not present

## 2021-02-27 NOTE — Therapy (Signed)
[facility] Physical Therapy [address] [place], [place], [address] Phone: [phone]   Fax:  [phone]  Physical Therapy Treatment  Patient Details  Name: [name] MRN: [id number] Date of Birth: [birth year] Referring Provider (PT): Dr Harless Nakayama   Encounter Date: 02/27/2021   PT End of Session - 02/27/21 1024    Visit Number 2    Number of Visits 8    Date for PT Re-Evaluation 03/25/21    Authorization Type BCBS    PT Start Time [phone]    PT Stop Time 1022    PT Time Calculation (min) 46 min    Activity Tolerance Patient tolerated treatment well    Behavior During Therapy [facility] for tasks assessed/performed           Past Medical History:  Diagnosis Date  . Diabetes mellitus without complication ([place])   . Hypertension   . Vitamin D deficiency 08/2017    Past Surgical History:  Procedure Laterality Date  . FRACTURE SURGERY    . I & D EXTREMITY Right 07/01/2013   Procedure: IRRIGATION AND DEBRIDEMENT RIGHT LEG;  Surgeon: Newt Minion, MD;  Location: [place];  Service: Orthopedics;  Laterality: Right;  . ORIF FEMUR FRACTURE Right 07/01/2013   Procedure: OPEN REDUCTION INTERNAL FIXATION (ORIF) DISTAL FEMUR FRACTURE;  Surgeon: Newt Minion, MD;  Location: [place];  Service: Orthopedics;  Laterality: Right;    There were no vitals filed for this visit.   Subjective Assessment - 02/27/21 0956    Subjective Pt reports about 4/10 overall pain today in her back and Rt hip.    Diagnostic tests xrays    Patient Stated Goals go up/down stairs like a regular person, not have as much pain in her back with daily actiivty              [facility] Adult PT Treatment/Exercise - 02/27/21 0001      Lumbar Exercises: Stretches   Single Knee to Chest Stretch Right;Left;2 reps;30 seconds    Lower Trunk Rotation Limitations 10 reps, 5 sec      Lumbar Exercises: Aerobic   Nustep L4 X8 min UE/LE      Lumbar Exercises: Machines for Strengthening   Leg Press  75# 2X15 then 43# SL 2X15 bilat      Lumbar Exercises: Standing   Other Standing Lumbar Exercises hip march, abd, and ext 3# X 20 ea      Lumbar Exercises: Seated   Sit to Stand Limitations 2 sets of 10 no UE support      Lumbar Exercises: Supine   Bridge 10 reps;5 seconds                       PT Long Term Goals - 02/25/21 1251      PT LONG TERM GOAL #1   Title I with advanced HEP    Time 4    Period Weeks    Status New    Target Date 04/08/21      PT LONG TERM GOAL #2   Title improve FOTO =/> 67    Time 4    Period Weeks    Status New    Target Date 03/25/21      PT LONG TERM GOAL #3   Title increase Rt hip strength =/> 5/5 to allow alternating steps on stairs    Time 4    Period Weeks    Status New    Target Date  03/25/21      PT LONG TERM GOAL #4   Title demo good pelvic mobility to assist with stairs    Time 4    Period Weeks    Status New    Target Date 03/25/21      PT LONG TERM GOAL #5   Title report =/> 75% reduction of back pain with daily acitivty    Time 4    Period Weeks    Status New    Target Date 03/25/21      Additional Long Term Goals   Additional Long Term Goals Yes      PT LONG TERM GOAL #6   Title report =/> 50% reduction in fear of falling    Time 4    Period Weeks    Status New    Target Date 03/25/21                 Plan - 02/27/21 1024    Clinical Impression Statement Session focused on LE strengthening and lumbar stretching to her tolerance. She did get fatigued during session so allowed rest breaks as appropriate. Continue POC.    Personal Factors and Comorbidities Comorbidity 2    Examination-Activity Limitations Squat;Stairs    Examination-Participation Restrictions Other    Stability/Clinical Decision Making Stable/Uncomplicated    Rehab Potential Good    PT Frequency 2x / week    PT Duration 4 weeks    PT Treatment/Interventions Stair training;Taping;Patient/family education;Functional mobility  training;Moist Heat;Traction;Passive range of motion;Therapeutic exercise;Cryotherapy;Electrical Stimulation;Neuromuscular re-education;Balance training;Manual techniques;Dry needling;Spinal Manipulations    PT Next Visit Plan work toward stairs and progress leg strength as tolerated . consider spinal mobs, pelvic mobility, hip and core stability    PT Home Exercise Plan [email or web address]    Consulted and Agree with Plan of Care Patient           Patient will benefit from skilled therapeutic intervention in order to improve the following deficits and impairments:  Decreased range of motion,Pain,Hypomobility,Postural dysfunction,Decreased strength  Visit Diagnosis: Muscle weakness (generalized)  Chronic midline low back pain without sciatica  Other abnormalities of gait and mobility     Problem List Patient Active Problem List   Diagnosis Date Noted  . Hereditary hemochromatosis ([place]) 11/03/2017  . Ankle pain, chronic, left 03/15/2012    [name] 02/27/2021, 10:26 AM  [facility] Physical Therapy [address] [place], [place], [address] Phone: [phone]   Fax:  [phone]  Name: [name] MRN: [id number] Date of Birth: 15-Mar-1951

## 2021-03-04 ENCOUNTER — Encounter: Payer: Self-pay | Admitting: Physical Therapy

## 2021-03-04 ENCOUNTER — Ambulatory Visit (INDEPENDENT_AMBULATORY_CARE_PROVIDER_SITE_OTHER): Payer: Medicare Other | Admitting: Physical Therapy

## 2021-03-04 ENCOUNTER — Other Ambulatory Visit: Payer: Self-pay | Admitting: *Deleted

## 2021-03-04 ENCOUNTER — Other Ambulatory Visit: Payer: Self-pay

## 2021-03-04 DIAGNOSIS — G8929 Other chronic pain: Secondary | ICD-10-CM

## 2021-03-04 DIAGNOSIS — M6281 Muscle weakness (generalized): Secondary | ICD-10-CM

## 2021-03-04 DIAGNOSIS — R2689 Other abnormalities of gait and mobility: Secondary | ICD-10-CM | POA: Diagnosis not present

## 2021-03-04 DIAGNOSIS — M545 Low back pain, unspecified: Secondary | ICD-10-CM | POA: Diagnosis not present

## 2021-03-04 NOTE — Therapy (Signed)
[facility] Physical Therapy [address] [place], [place], [address] Phone: [phone]   Fax:  [phone]  Physical Therapy Treatment  Patient Details  Name: [name] MRN: [id number] Date of Birth: 1951/09/04 Referring Provider (PT): Dr Harless Nakayama   Encounter Date: 03/04/2021   PT End of Session - 03/04/21 0851    Visit Number 3    Number of Visits 8    Date for PT Re-Evaluation 03/25/21    Authorization Type BCBS    PT Start Time 0845    PT Stop Time 0923    PT Time Calculation (min) 38 min    Activity Tolerance Patient tolerated treatment well    Behavior During Therapy [facility] for tasks assessed/performed           Past Medical History:  Diagnosis Date  . Diabetes mellitus without complication ([place])   . Hypertension   . Vitamin D deficiency 08/2017    Past Surgical History:  Procedure Laterality Date  . FRACTURE SURGERY    . I & D EXTREMITY Right 07/01/2013   Procedure: IRRIGATION AND DEBRIDEMENT RIGHT LEG;  Surgeon: Newt Minion, MD;  Location: [place];  Service: Orthopedics;  Laterality: Right;  . ORIF FEMUR FRACTURE Right 07/01/2013   Procedure: OPEN REDUCTION INTERNAL FIXATION (ORIF) DISTAL FEMUR FRACTURE;  Surgeon: Newt Minion, MD;  Location: [place];  Service: Orthopedics;  Laterality: Right;    There were no vitals filed for this visit.   Subjective Assessment - 03/04/21 0848    Subjective Pt arriving today reporting pain when first waking up in the morning down entire right LE.    Diagnostic tests xrays    Patient Stated Goals go up/down stairs like a regular person, not have as much pain in her back with daily actiivty    Currently in Pain? Yes    Pain Score 2     Pain Location Knee    Pain Orientation Right    Pain Descriptors / Indicators Sore    Pain Type Chronic pain    Pain Onset More than a month ago    Pain Frequency Intermittent    Multiple Pain Sites Yes    Pain Score 2    Pain Location Shoulder    Pain  Orientation Right    Pain Descriptors / Indicators Sore    Pain Type Chronic pain    Pain Onset More than a month ago    Pain Frequency Intermittent                             [facility] Adult PT Treatment/Exercise - 03/04/21 0001      Lumbar Exercises: Stretches   Single Knee to Chest Stretch Right;Left;2 reps;30 seconds    Lower Trunk Rotation Limitations 10 reps, 5 sec      Lumbar Exercises: Aerobic   Nustep L4 X8 min UE/LE      Lumbar Exercises: Machines for Strengthening   Leg Press 75# 2X15 then 43# SL 2X15 bilat      Lumbar Exercises: Standing   Other Standing Lumbar Exercises hip march, abd, and ext 3# X 20 ea    Other Standing Lumbar Exercises step ups with single UE support x 15 each LE leading on 4 inch step      Lumbar Exercises: Seated   Sit to Stand Limitations 2 sets of 10 no UE support    Other Seated Lumbar Exercises SLR seated x 10  on each LE      Lumbar Exercises: Supine   Bridge 10 reps;5 seconds                       PT Long Term Goals - 03/04/21 0909      PT LONG TERM GOAL #1   Title I with advanced HEP    Status On-going      PT LONG TERM GOAL #2   Title improve FOTO =/> 67    Status On-going      PT LONG TERM GOAL #3   Title increase Rt hip strength =/> 5/5 to allow alternating steps on stairs    Status On-going      PT LONG TERM GOAL #4   Title demo good pelvic mobility to assist with stairs    Status On-going      PT LONG TERM GOAL #5   Status On-going      PT LONG TERM GOAL #6   Title report =/> 50% reduction in fear of falling    Status On-going                 Plan - 03/04/21 3546    Clinical Impression Statement Pt tolerating LE strengthening and lumbar stretching with mild pain reported in right LE. Pt with muscle fatigue throughout session. Pt requiring short rest breaks as needed. We began stair stepping with UE support. Focusing on right quad strengthening. Continue skilled PT to maximize  function.    Personal Factors and Comorbidities Comorbidity 2    Examination-Activity Limitations Squat;Stairs    Stability/Clinical Decision Making Stable/Uncomplicated    Rehab Potential Good    PT Frequency 2x / week    PT Duration 4 weeks    PT Treatment/Interventions Stair training;Taping;Patient/family education;Functional mobility training;Moist Heat;Traction;Passive range of motion;Therapeutic exercise;Cryotherapy;Electrical Stimulation;Neuromuscular re-education;Balance training;Manual techniques;Dry needling;Spinal Manipulations    PT Next Visit Plan stairs training, LE strengtheing, lumbar stretching,  consider spinal mobs, pelvic mobility, hip and core stability    PT Home Exercise Plan [email or web address]    Consulted and Agree with Plan of Care Patient           Patient will benefit from skilled therapeutic intervention in order to improve the following deficits and impairments:  Decreased range of motion,Pain,Hypomobility,Postural dysfunction,Decreased strength  Visit Diagnosis: Muscle weakness (generalized)  Chronic midline low back pain without sciatica  Other abnormalities of gait and mobility     Problem List Patient Active Problem List   Diagnosis Date Noted  . Hereditary hemochromatosis ([place]) 11/03/2017  . Ankle pain, chronic, left 03/15/2012    [name], PT, MPT 03/04/2021, 9:29 AM  [facility] Physical Therapy [address] [place], [place], [address] Phone: [phone]   Fax:  [phone]  Name: Jasmain Ahlberg MRN: [id number] Date of Birth: 03-07-1951

## 2021-03-05 ENCOUNTER — Inpatient Hospital Stay: Payer: Medicare Other | Attending: Hematology and Oncology

## 2021-03-05 ENCOUNTER — Inpatient Hospital Stay: Payer: Medicare Other

## 2021-03-05 ENCOUNTER — Other Ambulatory Visit: Payer: Self-pay | Admitting: *Deleted

## 2021-03-05 DIAGNOSIS — Z79899 Other long term (current) drug therapy: Secondary | ICD-10-CM | POA: Insufficient documentation

## 2021-03-05 DIAGNOSIS — E1165 Type 2 diabetes mellitus with hyperglycemia: Secondary | ICD-10-CM | POA: Diagnosis not present

## 2021-03-05 DIAGNOSIS — Z8781 Personal history of (healed) traumatic fracture: Secondary | ICD-10-CM | POA: Insufficient documentation

## 2021-03-05 DIAGNOSIS — E669 Obesity, unspecified: Secondary | ICD-10-CM | POA: Diagnosis not present

## 2021-03-05 LAB — HEMOGLOBIN A1C
Hgb A1c MFr Bld: 8.2 % — ABNORMAL HIGH (ref 4.8–5.6)
Mean Plasma Glucose: 188.64 mg/dL

## 2021-03-05 LAB — IRON AND TIBC
Iron: 96 ug/dL (ref 41–142)
Saturation Ratios: 26 % (ref 21–57)
TIBC: 375 ug/dL (ref 236–444)
UIBC: 279 ug/dL (ref 120–384)

## 2021-03-05 LAB — CBC WITH DIFFERENTIAL (CANCER CENTER ONLY)
Abs Immature Granulocytes: 0.01 10*3/uL (ref 0.00–0.07)
Basophils Absolute: 0 10*3/uL (ref 0.0–0.1)
Basophils Relative: 1 %
Eosinophils Absolute: 0.1 10*3/uL (ref 0.0–0.5)
Eosinophils Relative: 2 %
HCT: 39.6 % (ref 36.0–46.0)
Hemoglobin: 13.7 g/dL (ref 12.0–15.0)
Immature Granulocytes: 0 %
Lymphocytes Relative: 23 %
Lymphs Abs: 0.7 10*3/uL (ref 0.7–4.0)
MCH: 32.3 pg (ref 26.0–34.0)
MCHC: 34.6 g/dL (ref 30.0–36.0)
MCV: 93.4 fL (ref 80.0–100.0)
Monocytes Absolute: 0.2 10*3/uL (ref 0.1–1.0)
Monocytes Relative: 8 %
Neutro Abs: 2 10*3/uL (ref 1.7–7.7)
Neutrophils Relative %: 66 %
Platelet Count: 100 10*3/uL — ABNORMAL LOW (ref 150–400)
RBC: 4.24 MIL/uL (ref 3.87–5.11)
RDW: 13.9 % (ref 11.5–15.5)
WBC Count: 3.1 10*3/uL — ABNORMAL LOW (ref 4.0–10.5)
nRBC: 0 % (ref 0.0–0.2)

## 2021-03-05 LAB — CMP (CANCER CENTER ONLY)
ALT: 76 U/L — ABNORMAL HIGH (ref 0–44)
AST: 82 U/L — ABNORMAL HIGH (ref 15–41)
Albumin: 4.2 g/dL (ref 3.5–5.0)
Alkaline Phosphatase: 151 U/L — ABNORMAL HIGH (ref 38–126)
Anion gap: 13 (ref 5–15)
BUN: 15 mg/dL (ref 8–23)
CO2: 28 mmol/L (ref 22–32)
Calcium: 10.1 mg/dL (ref 8.9–10.3)
Chloride: 100 mmol/L (ref 98–111)
Creatinine: 0.79 mg/dL (ref 0.44–1.00)
GFR, Estimated: >60 mL/min (ref >60)
Glucose, Bld: 322 mg/dL — ABNORMAL HIGH (ref 70–99)
Potassium: 4.3 mmol/L (ref 3.5–5.1)
Sodium: 141 mmol/L (ref 135–145)
Total Bilirubin: 0.7 mg/dL (ref 0.3–1.2)
Total Protein: 6.8 g/dL (ref 6.5–8.1)

## 2021-03-05 LAB — FERRITIN: Ferritin: 51 ng/mL (ref 11–307)

## 2021-03-05 NOTE — Progress Notes (Signed)
1334 OK to do Phlebotomy today per Dr. Lindi Adie.  Yulee escorted pt from lobby to chair and VS obtained.   59 RN explained to pt that the nurse will be able to do procedure in a few minutes.   1400 Patient was seen leaving by another nurse and pt stated that she would reschedule her appt.

## 2021-03-05 NOTE — Assessment & Plan Note (Signed)
Elevated liver function tests with elevated ferritin 08/2017 Ferritin : 918.9, iron saturation 43% 01/17/2018 ferritin 124, iron saturation 22% 05/30/2018: Ferritin 108, iron saturation 34% 09/21/2018: Ferritin 24, iron saturation 24% 04/05/2019:Ferritin 180, iron saturation 38% 06/29/2019: Ferritin 169, iron saturation 31% 10/02/2019: Ferritin 102, iron saturation 28% 02/05/2020: Ferritin 71, iron saturation 20% 07/23/2020: Ferritin 41, iron saturation 13% 01/23/2021: Ferritin 105, iron saturation 33%, AST 102, ALT 126, alkaline phosphatase 165, bilirubin 0.8 03/05/21: Ferritin 51, Iron sat 26%, Platelets 100, AST 82, ALT 76, Alk Phos 151  Phlebotomy summary: 1.Phlebotomy weeklyfor 4 weeks completed 12/07/17 2.Phlebotomy every 2 weeks x4 completed 01/31/2018 3.Monthly phlebotomiesuntil November 2019,switched to every 3-monthphlebotomies until 04/06/2019, switched to every 6 weeks starting 10/04/2019.  It has been 2 and half months since the last phlebotomy and we are seeing an increase in the liver function tests. Therefore we will go back to every 6-week phlebotomies.  Obesity: She fractured her right inferior pubic ramus and because of that, her ambulation has decreased and since then she has gained more weight.  Follow-up every 6 weeks with labs and phlebotomy.

## 2021-03-06 ENCOUNTER — Inpatient Hospital Stay (HOSPITAL_BASED_OUTPATIENT_CLINIC_OR_DEPARTMENT_OTHER): Payer: Medicare Other | Admitting: Hematology and Oncology

## 2021-03-06 ENCOUNTER — Encounter: Payer: Self-pay | Admitting: Physical Therapy

## 2021-03-06 ENCOUNTER — Other Ambulatory Visit: Payer: Self-pay

## 2021-03-06 ENCOUNTER — Inpatient Hospital Stay: Payer: Medicare Other

## 2021-03-06 ENCOUNTER — Ambulatory Visit (INDEPENDENT_AMBULATORY_CARE_PROVIDER_SITE_OTHER): Payer: Medicare Other | Admitting: Physical Therapy

## 2021-03-06 DIAGNOSIS — G8929 Other chronic pain: Secondary | ICD-10-CM | POA: Diagnosis not present

## 2021-03-06 DIAGNOSIS — Z79899 Other long term (current) drug therapy: Secondary | ICD-10-CM | POA: Diagnosis not present

## 2021-03-06 DIAGNOSIS — M545 Low back pain, unspecified: Secondary | ICD-10-CM

## 2021-03-06 DIAGNOSIS — E1165 Type 2 diabetes mellitus with hyperglycemia: Secondary | ICD-10-CM | POA: Diagnosis not present

## 2021-03-06 DIAGNOSIS — Z8781 Personal history of (healed) traumatic fracture: Secondary | ICD-10-CM | POA: Diagnosis not present

## 2021-03-06 DIAGNOSIS — M6281 Muscle weakness (generalized): Secondary | ICD-10-CM

## 2021-03-06 DIAGNOSIS — R2689 Other abnormalities of gait and mobility: Secondary | ICD-10-CM

## 2021-03-06 DIAGNOSIS — E669 Obesity, unspecified: Secondary | ICD-10-CM | POA: Diagnosis not present

## 2021-03-06 NOTE — Progress Notes (Signed)
[name] presents today for phlebotomy per MD orders. Phlebotomy procedure started at 1506 and ended at 1509 using 16G phlebotomy kit to RAC. 522 grams removed. IV needle removed intact Patient observed for 30 minutes after procedure without any incident and was offered food and beverage.  Patient tolerated procedure well and was stable with vitals WDL upon departure.    

## 2021-03-06 NOTE — Patient Instructions (Signed)
Therapeutic Phlebotomy Therapeutic phlebotomy is the planned removal of blood from a person's body for the purpose of treating a medical condition. The procedure is similar to donating blood. Usually, about a pint (470 mL, or 0.47 L) of blood is removed. The average adult has 9-12 pints (4.3-5.7 L) of blood in the body. Therapeutic phlebotomy may be used to treat the following medical conditions:  Hemochromatosis. This is a condition in which the blood contains too much iron.  Polycythemia vera. This is a condition in which the blood contains too many red blood cells.  Porphyria cutanea tarda. This is a disease in which an important part of hemoglobin is not made properly. It results in the buildup of abnormal amounts of porphyrins in the body.  Sickle cell disease. This is a condition in which the red blood cells form an abnormal crescent shape rather than a round shape. Tell a health care provider about:  Any allergies you have.  All medicines you are taking, including vitamins, herbs, eye drops, creams, and over-the-counter medicines.  Any problems you or family members have had with anesthetic medicines.  Any blood disorders you have.  Any surgeries you have had.  Any medical conditions you have.  Whether you are pregnant or may be pregnant. What are the risks? Generally, this is a safe procedure. However, problems may occur, including:  Nausea or light-headedness.  Low blood pressure (hypotension).  Soreness, bleeding, swelling, or bruising at the needle insertion site.  Infection. What happens before the procedure?  Follow instructions from your health care provider about eating or drinking restrictions.  Ask your health care provider about: ? Changing or stopping your regular medicines. This is especially important if you are taking diabetes medicines or blood thinners (anticoagulants). ? Taking medicines such as aspirin and ibuprofen. These medicines can thin your  blood. Do not take these medicines unless your health care provider tells you to take them. ? Taking over-the-counter medicines, vitamins, herbs, and supplements.  Wear clothing with sleeves that can be raised above the elbow.  Plan to have someone take you home from the hospital or clinic.  You may have a blood sample taken.  Your blood pressure, pulse rate, and breathing rate will be measured. What happens during the procedure?  To lower your risk of infection: ? Your health care team will wash or sanitize their hands. ? Your skin will be cleaned with an antiseptic.  You may be given a medicine to numb the area (local anesthetic).  A tourniquet will be placed on your arm.  A needle will be inserted into one of your veins.  Tubing and a collection bag will be attached to that needle.  Blood will flow through the needle and tubing into the collection bag.  The collection bag will be placed lower than your arm to allow gravity to help the flow of blood into the bag.  You may be asked to open and close your hand slowly and continually during the entire collection.  After the specified amount of blood has been removed from your body, the collection bag and tubing will be clamped.  The needle will be removed from your vein.  Pressure will be held on the site of the needle insertion to stop the bleeding.  A bandage (dressing) will be placed over the needle insertion site. The procedure may vary among health care providers and hospitals.   What happens after the procedure?  Your blood pressure, pulse rate, and breathing rate will   be measured after the procedure.  You will be encouraged to drink fluids.  Your recovery will be assessed and monitored.  You can return to your normal activities as told by your health care provider. Summary  Therapeutic phlebotomy is the planned removal of blood from a person's body for the purpose of treating a medical condition.  Therapeutic  phlebotomy may be used to treat hemochromatosis, polycythemia vera, porphyria cutanea tarda, or sickle cell disease.  In the procedure, a needle is inserted and about a pint (470 mL, or 0.47 L) of blood is removed. The average adult has 9-12 pints (4.3-5.7 L) of blood in the body.  This is generally a safe procedure, but it can sometimes cause problems such as nausea, light-headedness, or low blood pressure (hypotension). This information is not intended to replace advice given to you by your health care provider. Make sure you discuss any questions you have with your health care provider. Document Revised: 10/21/2017 Document Reviewed: 10/21/2017 Elsevier Patient Education  2021 Elsevier Inc.  

## 2021-03-06 NOTE — Progress Notes (Signed)
Patient Care Team: Lawerance Cruel, MD as PCP - General (Family Medicine) Lavonna Monarch, MD as Consulting Physician (Dermatology)  DIAGNOSIS:    ICD-10-CM   1. Hereditary hemochromatosis ([place])  E83.110     CHIEF COMPLIANT: Follow-up of hereditary hemochromatosis  INTERVAL HISTORY: [name] is a 70 y.o. with above-mentioned history of hereditary hemochromatosis on phlebotomiesevery6 weeks, most recently5/18/22. Labs on 03/05/21 showed: WBC3.1, Hg 13.7, HCT39.6, platelets100,creatinine 0.79, AST 82, ALT 76,iron saturation 26%, ferritin 51. She presentsto the clinic today to review her recent labs.  ALLERGIES:  is allergic to codeine.  MEDICATIONS:  Current Outpatient Medications  Medication Sig Dispense Refill  . alendronate (FOSAMAX) 70 MG tablet Take 1 tablet (70 mg total) by mouth once a week. Take with a full glass of water on an empty stomach. (Patient not taking: Reported on 02/25/2021)    . amLODipine (NORVASC) 10 MG tablet Take 10 mg by mouth daily.    . cholecalciferol (VITAMIN D) 1000 units tablet Take 5,000 Units by mouth daily.    Marland Kitchen losartan (COZAAR) 100 MG tablet Take 100 mg by mouth daily.    . pravastatin (PRAVACHOL) 40 MG tablet Take 40 mg by mouth daily.    . sertraline (ZOLOFT) 100 MG tablet     . TRELEGY ELLIPTA 100-62.5-25 MCG/INH AEPB      No current facility-administered medications for this visit.    PHYSICAL EXAMINATION: ECOG PERFORMANCE STATUS: 1 - Symptomatic but completely ambulatory  Vitals:   03/06/21 1428  BP: 131/75  Pulse: 82  Resp: 15  Temp: (!) 97.2 F (36.2 C)  SpO2: 95%   Filed Weights   03/06/21 1428  Weight: 159 lb 8 oz (72.3 kg)    LABORATORY DATA:  I have reviewed the data as listed CMP Latest Ref Rng & Units 03/05/2021 01/21/2021 07/23/2020  Glucose 70 - 99 mg/dL 322(H) 201(H) 165(H)  BUN 8 - 23 mg/dL 15 16 15   Creatinine 0.44 - 1.00 mg/dL 0.79 0.80 0.69  Sodium 135 - 145 mmol/L 141 138 141  Potassium 3.5  - 5.1 mmol/L 4.3 4.6 4.9  Chloride 98 - 111 mmol/L 100 102 104  CO2 22 - 32 mmol/L 28 22 29   Calcium 8.9 - 10.3 mg/dL 10.1 10.1 9.5  Total Protein 6.5 - 8.1 g/dL 6.8 6.8 6.9  Total Bilirubin 0.3 - 1.2 mg/dL 0.7 0.8 0.7  Alkaline Phos 38 - 126 U/L 151(H) 165(H) 116  AST 15 - 41 U/L 82(H) 102(H) 94(H)  ALT 0 - 44 U/L 76(H) 126(H) 103(H)    Lab Results  Component Value Date   WBC 3.1 (L) 03/05/2021   HGB 13.7 03/05/2021   HCT 39.6 03/05/2021   MCV 93.4 03/05/2021   PLT 100 (L) 03/05/2021   NEUTROABS 2.0 03/05/2021    ASSESSMENT & PLAN:  Hereditary hemochromatosis ([place]) Elevated liver function tests with elevated ferritin 08/2017 Ferritin : 918.9, iron saturation 43% 01/17/2018 ferritin 124, iron saturation 22% 05/30/2018: Ferritin 108, iron saturation 34% 09/21/2018: Ferritin 24, iron saturation 24% 04/05/2019:Ferritin 180, iron saturation 38% 06/29/2019: Ferritin 169, iron saturation 31% 10/02/2019: Ferritin 102, iron saturation 28% 02/05/2020: Ferritin 71, iron saturation 20% 07/23/2020: Ferritin 41, iron saturation 13% 01/23/2021: Ferritin 105, iron saturation 33%, AST 102, ALT 126, alkaline phosphatase 165, bilirubin 0.8 03/05/21: Ferritin 51, Iron sat 26%, Platelets 100, AST 82, ALT 76, Alk Phos 151  Phlebotomy schedule: Every 6 weeks   Elevated blood sugars: Hemoglobin A1c is 8.2: Patient was instructed that she is  diabetic and that she needs to discuss with Dr. Harrington Challenger about controlling her blood sugars.  We spent a lot of time discussing the importance of reading labels and knowing her calorie and carb intakes.  She is likely to need treatment for her diabetes.  I will forward the labs to Dr. Harrington Challenger for review.  Obesity: She fractured her right inferior pubic ramus and because of that, her ambulation has decreased and since then she has gained more weight.  Follow-up every 6 weeks with labs and phlebotomy.  Her ferritin levels came down to 51 and liver function tests also improved.   After few more of these phlebotomies we can discuss changing the frequency of the phlebotomies to every 8 weeks.   No orders of the defined types were placed in this encounter.  The patient has a good understanding of the overall plan. she agrees with it. she will call with any problems that may develop before the next visit here.  Total time spent: 20 mins including face to face time and time spent for planning, charting and coordination of care  Rulon Eisenmenger, MD, MPH 03/06/2021  I, Cloyde Reams Dorshimer, am acting as scribe for Dr. Nicholas Lose.  I have reviewed the above documentation for accuracy and completeness, and I agree with the above.

## 2021-03-06 NOTE — Therapy (Signed)
[facility] Physical Therapy [address] [place], [place], [address] Phone: [phone]   Fax:  [phone]  Physical Therapy Treatment  Patient Details  Name: [name] MRN: [id number] Date of Birth: 10-Jul-1951 Referring Provider (PT): Dr Harless Nakayama   Encounter Date: 03/06/2021   PT End of Session - 03/06/21 0818    Visit Number 4    Number of Visits 8    Date for PT Re-Evaluation 03/25/21    Authorization Type BCBS    PT Start Time 0804    PT Stop Time 0844    PT Time Calculation (min) 40 min    Activity Tolerance Patient tolerated treatment well    Behavior During Therapy [facility] for tasks assessed/performed           Past Medical History:  Diagnosis Date  . Diabetes mellitus without complication ([place])   . Hypertension   . Vitamin D deficiency 08/2017    Past Surgical History:  Procedure Laterality Date  . FRACTURE SURGERY    . I & D EXTREMITY Right 07/01/2013   Procedure: IRRIGATION AND DEBRIDEMENT RIGHT LEG;  Surgeon: Newt Minion, MD;  Location: [place];  Service: Orthopedics;  Laterality: Right;  . ORIF FEMUR FRACTURE Right 07/01/2013   Procedure: OPEN REDUCTION INTERNAL FIXATION (ORIF) DISTAL FEMUR FRACTURE;  Surgeon: Newt Minion, MD;  Location: [place];  Service: Orthopedics;  Laterality: Right;    There were no vitals filed for this visit.   Subjective Assessment - 03/06/21 0816    Subjective Pt states about 4 pain upon arrival to PT located aroung her pubic ramus/groin area.    Diagnostic tests xrays    Patient Stated Goals go up/down stairs like a regular person, not have as much pain in her back with daily actiivty    Pain Onset More than a month ago    Pain Onset More than a month ago            [facility] Adult PT Treatment/Exercise - 03/06/21 0001      Lumbar Exercises: Stretches   Single Knee to Chest Stretch Right;Left;2 reps;30 seconds    Quad Stretch Right;2 reps;Left;30 seconds      Lumbar Exercises: Aerobic    Nustep L5 X8 min UE/LE      Lumbar Exercises: Machines for Strengthening   Cybex Lumbar Extension 10#bilat 3X10    Cybex Knee Extension 25#bilat 3X10    Leg Press 100# 3X10 then 50# SL 2X15 bilat      Lumbar Exercises: Standing   Other Standing Lumbar Exercises hip march, abd, and ext 3# X 20 ea    Other Standing Lumbar Exercises step ups with single UE support x 15 each LE leading on 4 inch step      Lumbar Exercises: Seated   Sit to Stand Limitations 2 sets of 10 no UE support    Other Seated Lumbar Exercises SLR seated 2 x10 on each LE      Lumbar Exercises: Supine   Bridge 15 reps;5 seconds                       PT Long Term Goals - 03/04/21 0909      PT LONG TERM GOAL #1   Title I with advanced HEP    Status On-going      PT LONG TERM GOAL #2   Title improve FOTO =/> 67    Status On-going      PT LONG TERM  GOAL #3   Title increase Rt hip strength =/> 5/5 to allow alternating steps on stairs    Status On-going      PT LONG TERM GOAL #4   Title demo good pelvic mobility to assist with stairs    Status On-going      PT LONG TERM GOAL #5   Status On-going      PT LONG TERM GOAL #6   Title report =/> 50% reduction in fear of falling    Status On-going                 Plan - 03/06/21 0919    Clinical Impression Statement Continued to work to improve overall hip/lumbar strength and mobility with good overall tolerance to progression in resistance. Continue POC.    Personal Factors and Comorbidities Comorbidity 2    Examination-Activity Limitations Squat;Stairs    Stability/Clinical Decision Making Stable/Uncomplicated    Rehab Potential Good    PT Frequency 2x / week    PT Duration 4 weeks    PT Treatment/Interventions Stair training;Taping;Patient/family education;Functional mobility training;Moist Heat;Traction;Passive range of motion;Therapeutic exercise;Cryotherapy;Electrical Stimulation;Neuromuscular re-education;Balance training;Manual  techniques;Dry needling;Spinal Manipulations    PT Next Visit Plan stairs training, LE strengtheing, lumbar stretching,  consider spinal mobs, pelvic mobility, hip and core stability    PT Home Exercise Plan [email or web address]    Consulted and Agree with Plan of Care Patient           Patient will benefit from skilled therapeutic intervention in order to improve the following deficits and impairments:  Decreased range of motion,Pain,Hypomobility,Postural dysfunction,Decreased strength  Visit Diagnosis: Muscle weakness (generalized)  Chronic midline low back pain without sciatica  Other abnormalities of gait and mobility     Problem List Patient Active Problem List   Diagnosis Date Noted  . Hereditary hemochromatosis ([place]) 11/03/2017  . Ankle pain, chronic, left 03/15/2012    [name] 03/06/2021, 9:25 AM  [facility] Physical Therapy [address] [place], [place], [address] Phone: [phone]   Fax:  [phone]  Name: [name] MRN: [id number] Date of Birth: November 03, 1950

## 2021-03-11 ENCOUNTER — Other Ambulatory Visit: Payer: Self-pay

## 2021-03-11 ENCOUNTER — Ambulatory Visit (INDEPENDENT_AMBULATORY_CARE_PROVIDER_SITE_OTHER): Payer: Medicare Other | Admitting: Physical Therapy

## 2021-03-11 DIAGNOSIS — R2689 Other abnormalities of gait and mobility: Secondary | ICD-10-CM | POA: Diagnosis not present

## 2021-03-11 DIAGNOSIS — G8929 Other chronic pain: Secondary | ICD-10-CM

## 2021-03-11 DIAGNOSIS — M545 Low back pain, unspecified: Secondary | ICD-10-CM

## 2021-03-11 DIAGNOSIS — M6281 Muscle weakness (generalized): Secondary | ICD-10-CM

## 2021-03-11 NOTE — Therapy (Signed)
[facility] Physical Therapy [address] [place], [place], [address] Phone: [phone]   Fax:  [phone]  Physical Therapy Treatment  Patient Details  Name: [name] MRN: [id number] Date of Birth: January 17, 1951 Referring Provider (PT): Dr Harless Nakayama   Encounter Date: 03/11/2021   PT End of Session - 03/11/21 1140    Visit Number 5    Number of Visits 8    Date for PT Re-Evaluation 03/25/21    Authorization Type BCBS    PT Start Time 1105    PT Stop Time 1145    PT Time Calculation (min) 40 min    Activity Tolerance Patient tolerated treatment well    Behavior During Therapy [facility] for tasks assessed/performed           Past Medical History:  Diagnosis Date  . Diabetes mellitus without complication ([place])   . Hypertension   . Vitamin D deficiency 08/2017    Past Surgical History:  Procedure Laterality Date  . FRACTURE SURGERY    . I & D EXTREMITY Right 07/01/2013   Procedure: IRRIGATION AND DEBRIDEMENT RIGHT LEG;  Surgeon: Newt Minion, MD;  Location: [place];  Service: Orthopedics;  Laterality: Right;  . ORIF FEMUR FRACTURE Right 07/01/2013   Procedure: OPEN REDUCTION INTERNAL FIXATION (ORIF) DISTAL FEMUR FRACTURE;  Surgeon: Newt Minion, MD;  Location: [place];  Service: Orthopedics;  Laterality: Right;    There were no vitals filed for this visit.   Subjective Assessment - 03/11/21 1107    Subjective She has been doing her exercises.    Diagnostic tests xrays    Patient Stated Goals go up/down stairs like a regular person, not have as much pain in her back with daily actiivty    Currently in Pain? No/denies   since last PT appt had back pain 5/10 with standing >87min to cook or walks too much   Pain Onset More than a month ago    Pain Onset More than a month ago                             [facility] Adult PT Treatment/Exercise - 03/11/21 1105      Self-Care   Self-Care ADL's    ADL's PT demo & verbal cues on getting in  / out of bed via sidelying and pt return demo understanding.  PT discussed with pt along with demo positioning in sidelying with pillows to support upper arm & leg and "tenting" sheets off feet / lower legs. Pt verbalized understanding.      Lumbar Exercises: Stretches   Single Knee to Chest Stretch Right;Left;2 reps;30 seconds    Quad Stretch Right;2 reps;Left;30 seconds    Quad Stretch Limitations supine with LE over edge of bed with strap for knee flexion      Lumbar Exercises: Aerobic   Nustep L5 X8 min UE/LE      Lumbar Exercises: Machines for Strengthening   Cybex Knee Extension 10#bilat 3X10    Cybex Knee Flexion 25#bilat 3X10    Leg Press 100# 3X10 then 50# SL 2X15 bilat                       PT Long Term Goals - 03/04/21 0909      PT LONG TERM GOAL #1   Title I with advanced HEP    Status On-going      PT LONG TERM GOAL #2  Title improve FOTO =/> 67    Status On-going      PT LONG TERM GOAL #3   Title increase Rt hip strength =/> 5/5 to allow alternating steps on stairs    Status On-going      PT LONG TERM GOAL #4   Title demo good pelvic mobility to assist with stairs    Status On-going      PT LONG TERM GOAL #5   Status On-going      PT LONG TERM GOAL #6   Title report =/> 50% reduction in fear of falling    Status On-going                 Plan - 03/11/21 1140    Clinical Impression Statement PT instructed pt in bed mobility & positioning to protect her back and she appears to understand.  She reports back is getting better with PT and exercises.    Personal Factors and Comorbidities Comorbidity 2    Examination-Activity Limitations Squat;Stairs    Stability/Clinical Decision Making Stable/Uncomplicated    Rehab Potential Good    PT Frequency 2x / week    PT Duration 4 weeks    PT Treatment/Interventions Stair training;Taping;Patient/family education;Functional mobility training;Moist Heat;Traction;Passive range of motion;Therapeutic  exercise;Cryotherapy;Electrical Stimulation;Neuromuscular re-education;Balance training;Manual techniques;Dry needling;Spinal Manipulations    PT Next Visit Plan stairs training, LE strengtheing, lumbar stretching,  consider spinal mobs, pelvic mobility, hip and core stability    PT Home Exercise Plan [email or web address]    Consulted and Agree with Plan of Care Patient           Patient will benefit from skilled therapeutic intervention in order to improve the following deficits and impairments:  Decreased range of motion,Pain,Hypomobility,Postural dysfunction,Decreased strength  Visit Diagnosis: Muscle weakness (generalized)  Chronic midline low back pain without sciatica  Other abnormalities of gait and mobility     Problem List Patient Active Problem List   Diagnosis Date Noted  . Hereditary hemochromatosis ([place]) 11/03/2017  . Ankle pain, chronic, left 03/15/2012    Jamey Reas, PT, DPT 03/11/2021, 11:43 AM  [facility] Physical Therapy [address] [place], [place], [address] Phone: [phone]   Fax:  [phone]  Name: Micaylah Bertucci MRN: [id number] Date of Birth: 16-Apr-1951

## 2021-03-12 ENCOUNTER — Ambulatory Visit: Payer: Medicare Other | Admitting: Dermatology

## 2021-03-12 DIAGNOSIS — L570 Actinic keratosis: Secondary | ICD-10-CM

## 2021-03-12 DIAGNOSIS — L720 Epidermal cyst: Secondary | ICD-10-CM

## 2021-03-12 DIAGNOSIS — Z1283 Encounter for screening for malignant neoplasm of skin: Secondary | ICD-10-CM | POA: Diagnosis not present

## 2021-03-13 ENCOUNTER — Ambulatory Visit: Payer: Medicare Other | Admitting: Physician Assistant

## 2021-03-13 ENCOUNTER — Other Ambulatory Visit: Payer: Self-pay

## 2021-03-13 ENCOUNTER — Ambulatory Visit: Payer: Self-pay

## 2021-03-13 ENCOUNTER — Encounter: Payer: Self-pay | Admitting: Physician Assistant

## 2021-03-13 ENCOUNTER — Ambulatory Visit (INDEPENDENT_AMBULATORY_CARE_PROVIDER_SITE_OTHER): Payer: Medicare Other | Admitting: Physical Therapy

## 2021-03-13 DIAGNOSIS — M545 Low back pain, unspecified: Secondary | ICD-10-CM

## 2021-03-13 DIAGNOSIS — G8929 Other chronic pain: Secondary | ICD-10-CM

## 2021-03-13 DIAGNOSIS — S32501A Unspecified fracture of right pubis, initial encounter for closed fracture: Secondary | ICD-10-CM | POA: Diagnosis not present

## 2021-03-13 DIAGNOSIS — R2689 Other abnormalities of gait and mobility: Secondary | ICD-10-CM | POA: Diagnosis not present

## 2021-03-13 DIAGNOSIS — M6281 Muscle weakness (generalized): Secondary | ICD-10-CM | POA: Diagnosis not present

## 2021-03-13 NOTE — Progress Notes (Signed)
Office Visit Note   Patient: [name]           Date of Birth: Jun 01, 1951           MRN: [id number] Visit Date: 03/13/2021              Requested by: Lawerance Cruel, [place],  [place] [address] PCP: Lawerance Cruel, MD  Chief Complaint  Patient presents with  . Pelvis - Follow-up    DOI 01/09/21 nondisplaced inferior pubic rami fx       HPI: Patient presents today she is 2 months status post nondisplaced inferior pubic rami fracture.  She has been doing physical therapy and feels quite well.  She says the only time she really gets pain is walking prolonged distances  Assessment & Plan: Visit Diagnoses:  1. Traumatic closed nondisplaced fracture of pubis, right, initial encounter [facility])     Plan: May follow-up as needed  Follow-Up Instructions: No follow-ups on file.   Ortho Exam  Patient is alert, oriented, no adenopathy, well-dressed, normal affect, normal respiratory effort. Patient appears well no pain with internal and external rotation of the hip she still has some tenderness over the pelvis.  Good plantar flexion dorsiflexion strength  Imaging: XR Pelvis 1-2 Views  Result Date: 03/13/2021 AP pelvis today demonstrates callusing around the nondisplaced inferior pubic rami fracture.  No images are attached to the encounter.  Labs: Lab Results  Component Value Date   HGBA1C 8.2 (H) 03/05/2021   ESRSEDRATE 101 (H) 06/06/2011   ESRSEDRATE 49 (H) 06/04/2011   LABURIC 3.7 06/04/2011   REPTSTATUS 07/02/2013 FINAL 07/01/2013   GRAMSTAIN  06/06/2011    NO ORGANISMS SEEN RARE WBC PRESENT, PREDOMINANTLY PMN Gram Stain Report Called to,Read Back By and Verified With: DR. Veverly Fells AT [place] [id number] BY LOVE,T.   CULT NO GROWTH Performed at [facility] 07/01/2013   [place] 06/06/2011     Lab Results  Component Value Date   ALBUMIN 4.2 03/05/2021   ALBUMIN 4.2 01/21/2021   ALBUMIN 4.2 07/23/2020     No results found for: MG No results found for: VD25OH  No results found for: PREALBUMIN CBC EXTENDED Latest Ref Rng & Units 03/05/2021 01/21/2021 07/23/2020  WBC 4.0 - 10.5 K/uL 3.1(L) 5.5 4.4  RBC 3.87 - 5.11 MIL/uL 4.24 4.72 4.33  HGB 12.0 - 15.0 g/dL 13.7 14.8 13.2  HCT 36.0 - 46.0 % 39.6 43.2 39.8  PLT 150 - 400 K/uL 100(L) 113(L) 105(L)  NEUTROABS 1.7 - 7.7 K/uL 2.0 4.2 3.2  LYMPHSABS 0.7 - 4.0 K/uL 0.7 0.7 0.7     There is no height or weight on file to calculate BMI.  Orders:  Orders Placed This Encounter  Procedures  . XR Pelvis 1-2 Views   No orders of the defined types were placed in this encounter.    Procedures: No procedures performed  Clinical Data: No additional findings.  ROS:  All other systems negative, except as noted in the HPI. Review of Systems  Objective: Vital Signs: There were no vitals taken for this visit.  Specialty Comments:  No specialty comments available.  PMFS History: Patient Active Problem List   Diagnosis Date Noted  . Hereditary hemochromatosis ([place]) 11/03/2017  . Ankle pain, chronic, left 03/15/2012   Past Medical History:  Diagnosis Date  . Diabetes mellitus without complication ([place])   . Hypertension   . Vitamin D deficiency 08/2017    Family  History  Problem Relation Age of Onset  . Breast cancer Neg Hx     Past Surgical History:  Procedure Laterality Date  . FRACTURE SURGERY    . I & D EXTREMITY Right 07/01/2013   Procedure: IRRIGATION AND DEBRIDEMENT RIGHT LEG;  Surgeon: Newt Minion, MD;  Location: [place];  Service: Orthopedics;  Laterality: Right;  . ORIF FEMUR FRACTURE Right 07/01/2013   Procedure: OPEN REDUCTION INTERNAL FIXATION (ORIF) DISTAL FEMUR FRACTURE;  Surgeon: Newt Minion, MD;  Location: [place];  Service: Orthopedics;  Laterality: Right;   Social History   Occupational History  . Not on file  Tobacco Use  . Smoking status: Former Smoker    Packs/day: 1.00    Years: 44.00    Pack years:  44.00    Types: Cigarettes  . Smokeless tobacco: Never Used  Substance and Sexual Activity  . Alcohol use: Yes  . Drug use: Never  . Sexual activity: Not on file

## 2021-03-13 NOTE — Therapy (Signed)
[facility] Physical Therapy [address] [place], [place], [address] Phone: [phone]   Fax:  [phone]  Physical Therapy Treatment  Patient Details  Name: [name] MRN: [id number] Date of Birth: [birth year] Referring Provider (PT): Dr Harless Nakayama   Encounter Date: 03/13/2021   PT End of Session - 03/13/21 1006    Visit Number 6    Number of Visits 8    Date for PT Re-Evaluation 03/25/21    Authorization Type BCBS    PT Start Time 0930    PT Stop Time 1015    PT Time Calculation (min) 45 min    Activity Tolerance Patient tolerated treatment well    Behavior During Therapy [facility] for tasks assessed/performed           Past Medical History:  Diagnosis Date  . Diabetes mellitus without complication ([place])   . Hypertension   . Vitamin D deficiency 08/2017    Past Surgical History:  Procedure Laterality Date  . FRACTURE SURGERY    . I & D EXTREMITY Right 07/01/2013   Procedure: IRRIGATION AND DEBRIDEMENT RIGHT LEG;  Surgeon: Newt Minion, MD;  Location: [place];  Service: Orthopedics;  Laterality: Right;  . ORIF FEMUR FRACTURE Right 07/01/2013   Procedure: OPEN REDUCTION INTERNAL FIXATION (ORIF) DISTAL FEMUR FRACTURE;  Surgeon: Newt Minion, MD;  Location: [place];  Service: Orthopedics;  Laterality: Right;    There were no vitals filed for this visit.   Subjective Assessment - 03/13/21 0943    Subjective She relays her back pain feels ok, her inner thigh still bothers her at time, she will follow up with MD today.    Diagnostic tests xrays    Patient Stated Goals go up/down stairs like a regular person, not have as much pain in her back with daily actiivty    Pain Onset More than a month ago    Pain Onset More than a month ago              [facility] PT Assessment - 03/13/21 0001      Assessment   Medical Diagnosis Nondisplaced fx rt pubis and chronic LBP    Referring Provider (PT) Dr Harless Nakayama    Onset Date/Surgical Date 01/14/21       Observation/Other Assessments   Focus on Therapeutic Outcomes (FOTO)  55, goal 67      Single Leg Stance   Comments Lt leg max 15 sec, Rt leg max 8 sec      Strength   Overall Strength Comments hip/knee strength grossly 5- tested in sitting bilat             [facility] Adult PT Treatment/Exercise - 03/13/21 0001      Lumbar Exercises: Stretches   Single Knee to Chest Stretch Right;Left;2 reps;30 seconds    Quad Stretch Right;30 seconds;3 reps    Sports administrator Limitations supine with LE over edge of bed with strap for knee flexion      Lumbar Exercises: Aerobic   Nustep L5 X10 min UE/LE      Lumbar Exercises: Machines for Strengthening   Cybex Knee Extension 10#bilat 3X10    Cybex Knee Flexion 25#bilat 3X10    Leg Press 106# 3X10 then 50# SL 2X15 bilat      Lumbar Exercises: Standing   Other Standing Lumbar Exercises SLS 15 sec X 2 bilat. hip march, abd, and ext 3# X 15 ea    Other Standing Lumbar Exercises step ups with  single UE support x 10 bilat on 6 inch step                       PT Long Term Goals - 03/13/21 1019      PT LONG TERM GOAL #1   Title I with advanced HEP    Status On-going      PT LONG TERM GOAL #2   Title improve FOTO =/> 67    Baseline now 55    Status On-going      PT LONG TERM GOAL #3   Title increase Rt hip strength =/> 5/5 to allow alternating steps on stairs    Status Achieved      PT LONG TERM GOAL #4   Title demo good pelvic mobility to assist with stairs    Status On-going      PT LONG TERM GOAL #5   Status On-going      PT LONG TERM GOAL #6   Title report =/> 50% reduction in fear of falling    Status On-going                 Plan - 03/13/21 1008    Clinical Impression Statement She has made good progress with her overall leg strength and her single leg balance however pain levels are still up and down and she still has difficulty with stairs thus she would continue to benefit from skilled PT.    Personal  Factors and Comorbidities Comorbidity 2    Examination-Activity Limitations Squat;Stairs    Stability/Clinical Decision Making Stable/Uncomplicated    Rehab Potential Good    PT Frequency 2x / week    PT Duration 4 weeks    PT Treatment/Interventions Stair training;Taping;Patient/family education;Functional mobility training;Moist Heat;Traction;Passive range of motion;Therapeutic exercise;Cryotherapy;Electrical Stimulation;Neuromuscular re-education;Balance training;Manual techniques;Dry needling;Spinal Manipulations    PT Next Visit Plan stairs training, LE strengtheing, lumbar stretching,  consider spinal mobs, pelvic mobility, hip and core stability    PT Home Exercise Plan [email or web address]    Consulted and Agree with Plan of Care Patient           Patient will benefit from skilled therapeutic intervention in order to improve the following deficits and impairments:  Decreased range of motion,Pain,Hypomobility,Postural dysfunction,Decreased strength  Visit Diagnosis: Muscle weakness (generalized)  Chronic midline low back pain without sciatica  Other abnormalities of gait and mobility     Problem List Patient Active Problem List   Diagnosis Date Noted  . Hereditary hemochromatosis ([place]) 11/03/2017  . Ankle pain, chronic, left 03/15/2012    [name] 03/13/2021, 10:20 AM  [facility] Physical Therapy [address] [place], [place], [address] Phone: [phone]   Fax:  [phone]  Name: [name] MRN: [id number] Date of Birth: [birth year]

## 2021-03-18 ENCOUNTER — Other Ambulatory Visit: Payer: Self-pay

## 2021-03-18 ENCOUNTER — Ambulatory Visit (INDEPENDENT_AMBULATORY_CARE_PROVIDER_SITE_OTHER): Payer: Medicare Other | Admitting: Physical Therapy

## 2021-03-18 DIAGNOSIS — R2689 Other abnormalities of gait and mobility: Secondary | ICD-10-CM

## 2021-03-18 DIAGNOSIS — G8929 Other chronic pain: Secondary | ICD-10-CM | POA: Diagnosis not present

## 2021-03-18 DIAGNOSIS — M6281 Muscle weakness (generalized): Secondary | ICD-10-CM

## 2021-03-18 DIAGNOSIS — M545 Low back pain, unspecified: Secondary | ICD-10-CM | POA: Diagnosis not present

## 2021-03-18 NOTE — Therapy (Signed)
[facility] Physical Therapy [address] [place], [place], [address] Phone: [phone]   Fax:  [phone]  Physical Therapy Treatment  Patient Details  Name: [name] MRN: [id number] Date of Birth: [birth year] Referring Provider (PT): Dr Harless Nakayama   Encounter Date: 03/18/2021   PT End of Session - 03/18/21 0944    Visit Number 7    Number of Visits 8    Date for PT Re-Evaluation 03/25/21    Authorization Type BCBS    PT Start Time 0933    PT Stop Time 1012    PT Time Calculation (min) 39 min    Activity Tolerance Patient tolerated treatment well    Behavior During Therapy [facility] for tasks assessed/performed           Past Medical History:  Diagnosis Date  . Diabetes mellitus without complication ([place])   . Hypertension   . Vitamin D deficiency 08/2017    Past Surgical History:  Procedure Laterality Date  . FRACTURE SURGERY    . I & D EXTREMITY Right 07/01/2013   Procedure: IRRIGATION AND DEBRIDEMENT RIGHT LEG;  Surgeon: Newt Minion, MD;  Location: [place];  Service: Orthopedics;  Laterality: Right;  . ORIF FEMUR FRACTURE Right 07/01/2013   Procedure: OPEN REDUCTION INTERNAL FIXATION (ORIF) DISTAL FEMUR FRACTURE;  Surgeon: Newt Minion, MD;  Location: [place];  Service: Orthopedics;  Laterality: Right;    There were no vitals filed for this visit.   Subjective Assessment - 03/18/21 0941    Subjective Pt reporting her legs feel like they haven't woke up.    Diagnostic tests xrays    Patient Stated Goals go up/down stairs like a regular person, not have as much pain in her back with daily actiivty    Currently in Pain? No/denies                             [facility] Adult PT Treatment/Exercise - 03/18/21 0001      Lumbar Exercises: Stretches   Single Knee to Chest Stretch Right;Left;2 reps;30 seconds    Quad Stretch Right;30 seconds;3 reps    Sports administrator Limitations standing using strap      Lumbar Exercises: Aerobic    Nustep L5 X10 min UE/LE      Lumbar Exercises: Machines for Strengthening   Cybex Knee Extension 10#bilat 3X10    Cybex Knee Flexion 25#bilat 3X10    Leg Press 106# 3X10 then 56# SL 2X15 bilat      Lumbar Exercises: Standing   Other Standing Lumbar Exercises SLS on R: 8 seconds best times out of 5 trials, Left: 5 seconds best time out of 5 trials    Other Standing Lumbar Exercises step ups with single UE support x 10 bilat on 6 inch step, up and down stairs with single stair rail in clinic with supervision. and UE support, pt having difficulty with descending more than ascending      Lumbar Exercises: Supine   Bridge 15 reps;5 seconds                  PT Education - 03/18/21 0943    Education Details logrolling to assist with supine to sit and sit to supine    Person(s) Educated Patient    Methods Explanation;Tactile cues;Verbal cues    Comprehension Verbalized understanding;Returned demonstration               PT Long Term Goals -  03/18/21 0945      PT LONG TERM GOAL #1   Title I with advanced HEP    Time 4    Period Weeks    Status On-going      PT LONG TERM GOAL #2   Title improve FOTO =/> 67    Baseline now 55    Status On-going      PT LONG TERM GOAL #3   Title increase Rt hip strength =/> 5/5 to allow alternating steps on stairs    Status Achieved      PT LONG TERM GOAL #4   Title demo good pelvic mobility to assist with stairs    Status On-going      PT LONG TERM GOAL #5   Title report =/> 75% reduction of back pain with daily acitivty    Period Weeks    Status On-going      PT LONG TERM GOAL #6   Title report =/> 50% reduction in fear of falling                 Plan - 03/18/21 0946    Clinical Impression Statement Pt still feels like she needs to work on stair climbing and balance. Pt working on single leg blance today requiring close supervision and intermittent UE support. Pt progressing with LE strength and toward her LTG's. Pt  will need recertifiction next visit for additional PT visits to maximize her functional level.    Personal Factors and Comorbidities Age    Examination-Activity Limitations Squat;Stairs    Examination-Participation Restrictions Other    Stability/Clinical Decision Making Stable/Uncomplicated    Rehab Potential Good    PT Frequency 2x / week    PT Duration 4 weeks    PT Treatment/Interventions Stair training;Taping;Patient/family education;Functional mobility training;Moist Heat;Traction;Passive range of motion;Therapeutic exercise;Cryotherapy;Electrical Stimulation;Neuromuscular re-education;Balance training;Manual techniques;Dry needling;Spinal Manipulations    PT Next Visit Plan recert next visit for additional visits,    stairs training, LE strengtheing, lumbar stretching,  consider spinal mobs, pelvic mobility, hip and core stability    PT Home Exercise Plan [email or web address]    Consulted and Agree with Plan of Care Patient           Patient will benefit from skilled therapeutic intervention in order to improve the following deficits and impairments:  Decreased range of motion,Pain,Hypomobility,Postural dysfunction,Decreased strength  Visit Diagnosis: Muscle weakness (generalized)  Chronic midline low back pain without sciatica  Other abnormalities of gait and mobility     Problem List Patient Active Problem List   Diagnosis Date Noted  . Hereditary hemochromatosis ([place]) 11/03/2017  . Ankle pain, chronic, left 03/15/2012    [name], PT, MPT 03/18/2021, 10:12 AM  [facility] Physical Therapy [address] [place], [place], [address] Phone: [phone]   Fax:  [phone]  Name: [name] MRN: [id number] Date of Birth: [birth year]

## 2021-03-20 ENCOUNTER — Encounter: Payer: Self-pay | Admitting: Physical Therapy

## 2021-03-20 ENCOUNTER — Other Ambulatory Visit: Payer: Self-pay

## 2021-03-20 ENCOUNTER — Ambulatory Visit (INDEPENDENT_AMBULATORY_CARE_PROVIDER_SITE_OTHER): Payer: Medicare Other | Admitting: Physical Therapy

## 2021-03-20 DIAGNOSIS — R2689 Other abnormalities of gait and mobility: Secondary | ICD-10-CM | POA: Diagnosis not present

## 2021-03-20 DIAGNOSIS — M545 Low back pain, unspecified: Secondary | ICD-10-CM | POA: Diagnosis not present

## 2021-03-20 DIAGNOSIS — G8929 Other chronic pain: Secondary | ICD-10-CM

## 2021-03-20 DIAGNOSIS — M6281 Muscle weakness (generalized): Secondary | ICD-10-CM | POA: Diagnosis not present

## 2021-03-20 NOTE — Therapy (Signed)
[facility] Physical Therapy [address] [place], [place], [address] Phone: [phone]   Fax:  [phone]  Physical Therapy Treatment/Recert/Progress note Progress Note reporting period date 02/25/21 to 03/20/21  See below for objective and subjective measurements relating to patients progress with PT.    Patient Details  Name: [name] MRN: [id number] Date of Birth: 1951/07/20 Referring Provider (PT): Dr Harless Nakayama   Encounter Date: 03/20/2021   PT End of Session - 03/20/21 1401    Visit Number 8    Number of Visits 16    Date for PT Re-Evaluation 04/24/21    Authorization Type BCBS    Progress Note Due on Visit 18    PT Start Time 1303    PT Stop Time 1341    PT Time Calculation (min) 38 min           Past Medical History:  Diagnosis Date  . Diabetes mellitus without complication ([place])   . Hypertension   . Vitamin D deficiency 08/2017    Past Surgical History:  Procedure Laterality Date  . FRACTURE SURGERY    . I & D EXTREMITY Right 07/01/2013   Procedure: IRRIGATION AND DEBRIDEMENT RIGHT LEG;  Surgeon: Newt Minion, MD;  Location: [place];  Service: Orthopedics;  Laterality: Right;  . ORIF FEMUR FRACTURE Right 07/01/2013   Procedure: OPEN REDUCTION INTERNAL FIXATION (ORIF) DISTAL FEMUR FRACTURE;  Surgeon: Newt Minion, MD;  Location: [place];  Service: Orthopedics;  Laterality: Right;    There were no vitals filed for this visit.   Subjective Assessment - 03/20/21 1309    Subjective Pt reporting she feels overall better today, pain levels are ok and some functional tasks are getting easier.She feels like she needs more PT to work on strength and stairs and balance.    Diagnostic tests xrays    Patient Stated Goals go up/down stairs like a regular person, not have as much pain in her back with daily actiivty    Pain Onset More than a month ago    Pain Onset More than a month ago              [facility] PT Assessment - 03/20/21 0001       Assessment   Medical Diagnosis Nondisplaced fx rt pubis and chronic LBP    Referring Provider (PT) Dr Harless Nakayama    Onset Date/Surgical Date 01/14/21      AROM   Overall AROM Comments lumbar ROM now WNL      Strength   Overall Strength Comments hip/knee strength now 5/5 grossly                         [facility] Adult PT Treatment/Exercise - 03/20/21 0001      Lumbar Exercises: Aerobic   Nustep L6 X8 min UE/LE      Lumbar Exercises: Machines for Strengthening   Leg Press 112# 3X10 then 62# SL 2X15 bilat      Lumbar Exercises: Standing   Other Standing Lumbar Exercises SLS on Rt X 5 trials: 15 seconds best times out of 5 trials. At counter top with 2 round trips ea for tandem walk, retro walk, walking with eyes closed.    Other Standing Lumbar Exercises up and down stairs with single stair rail in clinic with supervision. and UE support, pt having difficulty with descending more than ascending. Then worked on [employer] step downs (stepping down with left and back up with  left X10) then 4 inch slow eccentric step downs 2X10 (stepping down with left and back up with right)                       PT Long Term Goals - 03/20/21 1408      PT LONG TERM GOAL #1   Title I with advanced HEP    Time 4    Period Weeks    Status On-going      PT LONG TERM GOAL #2   Title improve FOTO =/> 67    Baseline now 55    Status On-going      PT LONG TERM GOAL #3   Title increase Rt hip strength =/> 5/5 to allow alternating steps on stairs    Baseline now with 5/5 but Rt leg still funcionally weaker like with stairs    Time 4    Period Weeks    Status Partially Met      PT LONG TERM GOAL #4   Title demo good pelvic mobility to assist with stairs    Status Achieved      PT LONG TERM GOAL #5   Title report =/> 75% reduction of back pain with daily acitivty    Baseline relays 50-60% improved on 6/2    Period Weeks    Status On-going      PT LONG TERM GOAL #6    Title report =/> 50% reduction in fear of falling    Baseline about the same fear of falling    Time 4    Period Weeks    Status On-going                 Plan - 03/20/21 1402    Clinical Impression Statement Recert today as visit number was reached in her initial PT certification. She has made good progress with overall leg strength and hip/lumbar ROM but she does still have some weakness in Rt leg compared to Lt and she still has deficits with descending stairs and overall balance. She would benefit from additional PT for another 4 weeks to address these impairments and improve function.    Personal Factors and Comorbidities Age    Examination-Activity Limitations Squat;Stairs    Examination-Participation Restrictions Other    Stability/Clinical Decision Making Stable/Uncomplicated    Rehab Potential Good    PT Frequency 2x / week    PT Duration 4 weeks    PT Treatment/Interventions Stair training;Taping;Patient/family education;Functional mobility training;Moist Heat;Traction;Passive range of motion;Therapeutic exercise;Cryotherapy;Electrical Stimulation;Neuromuscular re-education;Balance training;Manual techniques;Dry needling;Spinal Manipulations    PT Next Visit Plan stairs training, LE strengtheing, balance. also consider lumbar stretching,  consider spinal mobs, pelvic mobility, hip and core stability    PT Home Exercise Plan [email or web address]    Consulted and Agree with Plan of Care Patient           Patient will benefit from skilled therapeutic intervention in order to improve the following deficits and impairments:  Decreased range of motion,Pain,Hypomobility,Postural dysfunction,Decreased strength  Visit Diagnosis: Muscle weakness (generalized)  Chronic midline low back pain without sciatica  Other abnormalities of gait and mobility     Problem List Patient Active Problem List   Diagnosis Date Noted  . Hereditary hemochromatosis ([place]) 11/03/2017  . Ankle pain,  chronic, left 03/15/2012    [name] 03/20/2021, 2:10 PM  [facility] Physical Therapy [address] [place], [place], [address] Phone: [phone]   Fax:  [phone]  Name: Tieara Flitton  [place] MRN: [id number] Date of Birth: Jul 16, 1951

## 2021-03-25 ENCOUNTER — Encounter: Payer: Self-pay | Admitting: Physical Therapy

## 2021-03-25 ENCOUNTER — Ambulatory Visit (INDEPENDENT_AMBULATORY_CARE_PROVIDER_SITE_OTHER): Payer: Medicare Other | Admitting: Physical Therapy

## 2021-03-25 ENCOUNTER — Other Ambulatory Visit: Payer: Self-pay

## 2021-03-25 DIAGNOSIS — R2689 Other abnormalities of gait and mobility: Secondary | ICD-10-CM

## 2021-03-25 DIAGNOSIS — M545 Low back pain, unspecified: Secondary | ICD-10-CM

## 2021-03-25 DIAGNOSIS — M6281 Muscle weakness (generalized): Secondary | ICD-10-CM

## 2021-03-25 DIAGNOSIS — G8929 Other chronic pain: Secondary | ICD-10-CM

## 2021-03-25 NOTE — Therapy (Signed)
[facility] Physical Therapy [address] [place], [place], [address] Phone: [phone]   Fax:  [phone]  Physical Therapy Treatment  Patient Details  Name: [name] MRN: [id number] Date of Birth: Jan 21, 1951 Referring Provider (PT): Dr Harless Nakayama   Encounter Date: 03/25/2021   PT End of Session - 03/25/21 1032    Visit Number 9    Number of Visits 16    Date for PT Re-Evaluation 04/24/21    Authorization Type BCBS    Progress Note Due on Visit 18    PT Start Time 1019    PT Stop Time 1058    PT Time Calculation (min) 39 min    Activity Tolerance Patient tolerated treatment well    Behavior During Therapy [facility] for tasks assessed/performed           Past Medical History:  Diagnosis Date  . Diabetes mellitus without complication ([place])   . Hypertension   . Vitamin D deficiency 08/2017    Past Surgical History:  Procedure Laterality Date  . FRACTURE SURGERY    . I & D EXTREMITY Right 07/01/2013   Procedure: IRRIGATION AND DEBRIDEMENT RIGHT LEG;  Surgeon: Newt Minion, MD;  Location: [place];  Service: Orthopedics;  Laterality: Right;  . ORIF FEMUR FRACTURE Right 07/01/2013   Procedure: OPEN REDUCTION INTERNAL FIXATION (ORIF) DISTAL FEMUR FRACTURE;  Surgeon: Newt Minion, MD;  Location: [place];  Service: Orthopedics;  Laterality: Right;    There were no vitals filed for this visit.   Subjective Assessment - 03/25/21 1030    Subjective Pt arriving reporting 2/10 pain today. Pt reports she was able to get up and down from squat position with UE support    Diagnostic tests xrays    Patient Stated Goals go up/down stairs like a regular person, not have as much pain in her back with daily actiivty    Currently in Pain? Yes    Pain Score 2     Pain Location Knee    Pain Orientation Right    Pain Descriptors / Indicators Sore    Pain Onset More than a month ago                             [facility] Adult PT Treatment/Exercise -  03/25/21 0001      Neuro Re-ed    Neuro Re-ed Details  tandum stance, tandum walking, backward walking, side steppin, SLS all with intermittent CGA for support      Lumbar Exercises: Aerobic   Recumbent Bike L3 x 7 minutes      Lumbar Exercises: Machines for Strengthening   Leg Press 112# 3X10 Rt LE only 62# SL 2X15 bilat      Lumbar Exercises: Standing   Other Standing Lumbar Exercises tandum walking forward and back, SLS x 10 trials with intermittent support.    Other Standing Lumbar Exercises up and down 6 inch step x 20 each LE                       PT Long Term Goals - 03/25/21 1035      PT LONG TERM GOAL #1   Title I with advanced HEP    Status On-going      PT LONG TERM GOAL #2   Title improve FOTO =/> 67    Status On-going      PT LONG TERM GOAL #3  Title increase Rt hip strength =/> 5/5 to allow alternating steps on stairs    Status On-going      PT LONG TERM GOAL #4   Title demo good pelvic mobility to assist with stairs    Status On-going      PT LONG TERM GOAL #5   Title report =/> 75% reduction of back pain with daily acitivty    Status On-going      PT LONG TERM GOAL #6   Title report =/> 50% reduction in fear of falling    Status On-going                 Plan - 03/25/21 1032    Clinical Impression Statement Pt tolerating exercises well progressing with LE strength. Pt still requiring intermittnet UE support and supervision with dynamic balance with functional mobility. Continue skilled PT to maximize funtion.    Personal Factors and Comorbidities Age    Examination-Activity Limitations Squat;Stairs    Examination-Participation Restrictions Other    Stability/Clinical Decision Making Stable/Uncomplicated    Rehab Potential Good    PT Frequency 2x / week    PT Duration 4 weeks    PT Treatment/Interventions Stair training;Taping;Patient/family education;Functional mobility training;Moist Heat;Traction;Passive range of  motion;Therapeutic exercise;Cryotherapy;Electrical Stimulation;Neuromuscular re-education;Balance training;Manual techniques;Dry needling;Spinal Manipulations    PT Next Visit Plan stairs training, LE strengtheing, balance. also consider lumbar stretching,  consider spinal mobs, pelvic mobility, hip and core stability    PT Home Exercise Plan [email or web address]    Consulted and Agree with Plan of Care Patient           Patient will benefit from skilled therapeutic intervention in order to improve the following deficits and impairments:  Decreased range of motion,Pain,Hypomobility,Postural dysfunction,Decreased strength  Visit Diagnosis: Muscle weakness (generalized)  Chronic midline low back pain without sciatica  Other abnormalities of gait and mobility     Problem List Patient Active Problem List   Diagnosis Date Noted  . Hereditary hemochromatosis ([place]) 11/03/2017  . Ankle pain, chronic, left 03/15/2012    [name], PT , MPT 03/25/2021, 10:43 AM  [facility] Physical Therapy [address] [place], [place], [address] Phone: [phone]   Fax:  [phone]  Name: [name] MRN: [id number] Date of Birth: 06-10-1951

## 2021-03-26 ENCOUNTER — Encounter: Payer: Self-pay | Admitting: Dermatology

## 2021-03-26 NOTE — Progress Notes (Signed)
   Follow-Up Visit   Subjective  [name] is a 70 y.o. female who presents for the following: Annual Exam (Yearly skin exam. No concerns. ).  General skin examination, crusts on cheek Location:  Duration:  Quality:  Associated Signs/Symptoms: Modifying Factors:  Severity:  Timing: Context:   Objective  Well appearing patient in no apparent distress; mood and affect are within normal limits. Objective  Mid Back: Full body skin exam. No atypical moles or non mole skin cancers.   Objective  Left Parotid Area (2): Gritty flat 6 mm pink crusts  Objective  Left Malar Cheek: Four millimeter deep dermal noninflamed white papule    A full examination was performed including scalp, head, eyes, ears, nose, lips, neck, chest, axillae, abdomen, back, buttocks, bilateral upper extremities, bilateral lower extremities, hands, feet, fingers, toes, fingernails, and toenails. All findings within normal limits unless otherwise noted below.  Areas beneath undergarments not fully examined   Assessment & Plan    Screening for malignant neoplasm of skin Mid Back  Yearly skin exams.  Encouraged to self examine with spouse twice annually.  Continue ultraviolet protection.  AK (actinic keratosis) (2) Left Parotid Area  Destruction of lesion - Left Parotid Area Complexity: simple   Destruction method: cryotherapy   Informed consent: discussed and consent obtained   Timeout:  patient name, date of birth, surgical site, and procedure verified Lesion destroyed using liquid nitrogen: Yes   Cryotherapy cycles:  3 Outcome: patient tolerated procedure well with no complications   Post-procedure details: wound care instructions given    Epidermal cyst Left Malar Cheek  Benign no treatment. Patient can decide they want [place] to excise it.  Under 6 mm benign excision      I, Lavonna Monarch, MD, have reviewed all documentation for this visit.  The documentation on 03/26/21 for the  exam, diagnosis, procedures, and orders are all accurate and complete.

## 2021-03-27 ENCOUNTER — Encounter: Payer: Self-pay | Admitting: Physical Therapy

## 2021-03-27 ENCOUNTER — Ambulatory Visit (INDEPENDENT_AMBULATORY_CARE_PROVIDER_SITE_OTHER): Payer: Medicare Other | Admitting: Physical Therapy

## 2021-03-27 ENCOUNTER — Other Ambulatory Visit: Payer: Self-pay

## 2021-03-27 DIAGNOSIS — R2689 Other abnormalities of gait and mobility: Secondary | ICD-10-CM | POA: Diagnosis not present

## 2021-03-27 DIAGNOSIS — M545 Low back pain, unspecified: Secondary | ICD-10-CM | POA: Diagnosis not present

## 2021-03-27 DIAGNOSIS — M6281 Muscle weakness (generalized): Secondary | ICD-10-CM

## 2021-03-27 DIAGNOSIS — G8929 Other chronic pain: Secondary | ICD-10-CM

## 2021-03-27 NOTE — Therapy (Signed)
[facility] Physical Therapy [address] [place], [place], [address] Phone: [phone]   Fax:  [phone]  Physical Therapy Treatment Progress Note reporting period 02/25/21 to 03/27/21  See below for objective and subjective measurements relating to patients progress with PT.   Patient Details  Name: [name] MRN: [id number] Date of Birth: [birth year] Referring Provider (PT): Dr Harless Nakayama   Encounter Date: 03/27/2021   PT End of Session - 03/27/21 1103     Visit Number 10    Number of Visits 16    Date for PT Re-Evaluation 04/24/21    Authorization Type BCBS    Progress Note Due on Visit 18    PT Start Time 1015    PT Stop Time 1055    PT Time Calculation (min) 40 min    Activity Tolerance Patient tolerated treatment well    Behavior During Therapy [facility] for tasks assessed/performed             Past Medical History:  Diagnosis Date   Diabetes mellitus without complication ([place])    Hypertension    Vitamin D deficiency 08/2017    Past Surgical History:  Procedure Laterality Date   FRACTURE SURGERY     I & D EXTREMITY Right 07/01/2013   Procedure: IRRIGATION AND DEBRIDEMENT RIGHT LEG;  Surgeon: Newt Minion, MD;  Location: [facility];  Service: Orthopedics;  Laterality: Right;   ORIF FEMUR FRACTURE Right 07/01/2013   Procedure: OPEN REDUCTION INTERNAL FIXATION (ORIF) DISTAL FEMUR FRACTURE;  Surgeon: Newt Minion, MD;  Location: [place];  Service: Orthopedics;  Laterality: Right;    There were no vitals filed for this visit.   Subjective Assessment - 03/27/21 1102     Subjective Relays less than 2/10 overall pain in Rt hip. [place] and [place] to next week so she was encouraged to set up further PT appointments.    Diagnostic tests xrays    Patient Stated Goals go up/down stairs like a regular person, not have as much pain in her back with daily actiivty    Pain Onset More than a month ago    Pain  Onset More than a month ago                [facility] PT Assessment - 03/27/21 0001       Assessment   Medical Diagnosis Nondisplaced fx rt pubis and chronic LBP    Referring Provider (PT) Dr Harless Nakayama    Onset Date/Surgical Date 01/14/21      AROM   Overall AROM Comments lumbar ROM now WNL      Strength   Overall Strength Comments hip/knee strength now 5/5 grossly tested in sitting                           [facility] Adult PT Treatment/Exercise - 03/27/21 0001       Lumbar Exercises: Aerobic   Recumbent Bike L3 X10 min      Lumbar Exercises: Machines for Strengthening   Leg Press 112# 2X15 Rt LE only 62# SL 2X15 bilat      Lumbar Exercises: Standing   Other Standing Lumbar Exercises tandum walking forward and back at counter X3, SLS x 5 trials with intermittent support. Resisted hip abd and extension X 20 reps bilat with green    Other Standing Lumbar Exercises up and down stairs with single stair rail in  clinic with supervision, 2 flights of stairs reciprocal pattern.                         PT Long Term Goals - 03/27/21 1109       PT LONG TERM GOAL #1   Title I with advanced HEP    Status On-going      PT LONG TERM GOAL #2   Title improve FOTO =/> 67    Status On-going      PT LONG TERM GOAL #3   Title increase Rt hip strength =/> 5/5 to allow alternating steps on stairs    Baseline now with 5/5 but Rt leg still funcionally weaker like with stairs    Status Partially Met      PT LONG TERM GOAL #4   Title demo good pelvic mobility to assist with stairs    Status Achieved      PT LONG TERM GOAL #5   Title report =/> 75% reduction of back pain with daily acitivty    Baseline relays 50-60% improved on 6/2    Status On-going      PT LONG TERM GOAL #6   Title report =/> 50% reduction in fear of falling    Baseline about the same fear of falling    Status On-going                   Plan - 03/27/21 1104     Clinical  Impression Statement Progress note refects she has made good overall progress with ROM, leg strength, and overall pain. She also can now perform our stairs reciprocally in the hall but does need one UE support with this and still has some difficulty at home with her stairs. She does still have mild functonal strength deficts even though the shows good MMT in sitting. PT recommending about 6 more PT visits to adress her remaining deficits and maximize function.    Personal Factors and Comorbidities Age    Examination-Activity Limitations Squat;Stairs    Examination-Participation Restrictions Other    Stability/Clinical Decision Making Stable/Uncomplicated    Rehab Potential Good    PT Frequency 2x / week    PT Duration 4 weeks    PT Treatment/Interventions Stair training;Taping;Patient/family education;Functional mobility training;Moist Heat;Traction;Passive range of motion;Therapeutic exercise;Cryotherapy;Electrical Stimulation;Neuromuscular re-education;Balance training;Manual techniques;Dry needling;Spinal Manipulations    PT Next Visit Plan stairs training, LE strengtheing, balance.  hip and core stability    PT Home Exercise Plan [email or web address]    Consulted and Agree with Plan of Care Patient             Patient will benefit from skilled therapeutic intervention in order to improve the following deficits and impairments:  Decreased range of motion, Pain, Hypomobility, Postural dysfunction, Decreased strength  Visit Diagnosis: Muscle weakness (generalized)  Other abnormalities of gait and mobility  Chronic midline low back pain without sciatica     Problem List Patient Active Problem List   Diagnosis Date Noted   Hereditary hemochromatosis ([place]) 11/03/2017   Ankle pain, chronic, left 03/15/2012    [name] 03/27/2021, 11:10 AM  [facility] Physical Therapy [address] [place], [place], [address] Phone: [phone]   Fax:  [phone]  Name:  [name] MRN: [id number] Date of Birth: [birth year]

## 2021-04-01 ENCOUNTER — Ambulatory Visit (INDEPENDENT_AMBULATORY_CARE_PROVIDER_SITE_OTHER): Payer: Medicare Other | Admitting: Physical Therapy

## 2021-04-01 ENCOUNTER — Other Ambulatory Visit: Payer: Self-pay

## 2021-04-01 DIAGNOSIS — M545 Low back pain, unspecified: Secondary | ICD-10-CM | POA: Diagnosis not present

## 2021-04-01 DIAGNOSIS — M6281 Muscle weakness (generalized): Secondary | ICD-10-CM

## 2021-04-01 DIAGNOSIS — G8929 Other chronic pain: Secondary | ICD-10-CM

## 2021-04-01 DIAGNOSIS — R2689 Other abnormalities of gait and mobility: Secondary | ICD-10-CM

## 2021-04-01 NOTE — Therapy (Signed)
[facility] Physical Therapy [address] [place], [place], [address] Phone: [phone]   Fax:  [phone]  Physical Therapy Treatment  Patient Details  Name: [name] MRN: [id number] Date of Birth: 12-31-1950 Referring Provider (PT): Dr Harless Nakayama   Encounter Date: 04/01/2021   PT End of Session - 04/01/21 1520     Visit Number 11    Number of Visits 16    Date for PT Re-Evaluation 04/24/21    Authorization Type BCBS    Progress Note Due on Visit 18    PT Start Time 1510    PT Stop Time 1550    PT Time Calculation (min) 40 min    Activity Tolerance Patient tolerated treatment well    Behavior During Therapy [facility] for tasks assessed/performed             Past Medical History:  Diagnosis Date   Diabetes mellitus without complication ([place])    Hypertension    Vitamin D deficiency 08/2017    Past Surgical History:  Procedure Laterality Date   FRACTURE SURGERY     I & D EXTREMITY Right 07/01/2013   Procedure: IRRIGATION AND DEBRIDEMENT RIGHT LEG;  Surgeon: Newt Minion, MD;  Location: [place];  Service: Orthopedics;  Laterality: Right;   ORIF FEMUR FRACTURE Right 07/01/2013   Procedure: OPEN REDUCTION INTERNAL FIXATION (ORIF) DISTAL FEMUR FRACTURE;  Surgeon: Newt Minion, MD;  Location: [place];  Service: Orthopedics;  Laterality: Right;    There were no vitals filed for this visit.   Subjective Assessment - 04/01/21 1516     Subjective Relays she just [place] from beach was able to walk in the sand, get in the water and navigate stairs, pain levels are overall doing well today.    Diagnostic tests xrays    Patient Stated Goals go up/down stairs like a regular person, not have as much pain in her back with daily actiivty    Pain Onset More than a month ago    Pain Onset More than a month ago               [facility] Adult PT Treatment/Exercise - 04/01/21 0001       Neuro Re-ed    Neuro Re-ed Details  tandem walk at counter top, SLS  (17 sec max)      Lumbar Exercises: Aerobic   Recumbent Bike L3 X10 min      Lumbar Exercises: Machines for Strengthening   Leg Press 118# 2X15 Rt LE only 75# SL 2X10 bilat      Lumbar Exercises: Standing   Other Standing Lumbar Exercises 8 inch step ups on Rt with one UE support X15    Other Standing Lumbar Exercises hip 4 way with green X15 bilat                         PT Long Term Goals - 03/27/21 1109       PT LONG TERM GOAL #1   Title I with advanced HEP    Status On-going      PT LONG TERM GOAL #2   Title improve FOTO =/> 67    Status On-going      PT LONG TERM GOAL #3   Title increase Rt hip strength =/> 5/5 to allow alternating steps on stairs    Baseline now with 5/5 but Rt leg still funcionally weaker like with stairs    Status Partially Met  PT LONG TERM GOAL #4   Title demo good pelvic mobility to assist with stairs    Status Achieved      PT LONG TERM GOAL #5   Title report =/> 75% reduction of back pain with daily acitivty    Baseline relays 50-60% improved on 6/2    Status On-going      PT LONG TERM GOAL #6   Title report =/> 50% reduction in fear of falling    Baseline about the same fear of falling    Status On-going                   Plan - 04/01/21 1550     Clinical Impression Statement She had good tolerance to strength progression and she showed improved overall balance today. Continue POC    Personal Factors and Comorbidities Age    Examination-Activity Limitations Squat;Stairs    Examination-Participation Restrictions Other    Stability/Clinical Decision Making Stable/Uncomplicated    Rehab Potential Good    PT Frequency 2x / week    PT Duration 4 weeks    PT Treatment/Interventions Stair training;Taping;Patient/family education;Functional mobility training;Moist Heat;Traction;Passive range of motion;Therapeutic exercise;Cryotherapy;Electrical Stimulation;Neuromuscular re-education;Balance training;Manual  techniques;Dry needling;Spinal Manipulations    PT Next Visit Plan stairs training, LE strengtheing, balance.  hip and core stability    PT Home Exercise Plan [email or web address]    Consulted and Agree with Plan of Care Patient             Patient will benefit from skilled therapeutic intervention in order to improve the following deficits and impairments:  Decreased range of motion, Pain, Hypomobility, Postural dysfunction, Decreased strength  Visit Diagnosis: Muscle weakness (generalized)  Other abnormalities of gait and mobility  Chronic midline low back pain without sciatica     Problem List Patient Active Problem List   Diagnosis Date Noted   Hereditary hemochromatosis ([place]) 11/03/2017   Ankle pain, chronic, left 03/15/2012    [name] 04/01/2021, 3:50 PM  [facility] Physical Therapy [address] [place], [place], [address] Phone: [phone]   Fax:  [phone]  Name: [name] MRN: [id number] Date of Birth: 1951/01/19

## 2021-04-08 ENCOUNTER — Other Ambulatory Visit: Payer: Self-pay

## 2021-04-08 ENCOUNTER — Ambulatory Visit (INDEPENDENT_AMBULATORY_CARE_PROVIDER_SITE_OTHER): Payer: Medicare Other | Admitting: Physical Therapy

## 2021-04-08 DIAGNOSIS — M545 Low back pain, unspecified: Secondary | ICD-10-CM

## 2021-04-08 DIAGNOSIS — M6281 Muscle weakness (generalized): Secondary | ICD-10-CM

## 2021-04-08 DIAGNOSIS — R2689 Other abnormalities of gait and mobility: Secondary | ICD-10-CM

## 2021-04-08 DIAGNOSIS — G8929 Other chronic pain: Secondary | ICD-10-CM

## 2021-04-08 NOTE — Therapy (Signed)
[facility] Physical Therapy [address] [place], [place], [address] Phone: [phone]   Fax:  [phone]  Physical Therapy Treatment  Patient Details  Name: [name] MRN: [id number] Date of Birth: Jun 30, 1951 Referring Provider (PT): Dr Harless Nakayama   Encounter Date: 04/08/2021   PT End of Session - 04/08/21 1455     Visit Number 12    Number of Visits 16    Date for PT Re-Evaluation 04/24/21    Authorization Type BCBS    Progress Note Due on Visit 18    PT Start Time 1430    PT Stop Time 1511    PT Time Calculation (min) 41 min    Activity Tolerance Patient tolerated treatment well    Behavior During Therapy [facility] for tasks assessed/performed             Past Medical History:  Diagnosis Date   Diabetes mellitus without complication ([place])    Hypertension    Vitamin D deficiency 08/2017    Past Surgical History:  Procedure Laterality Date   FRACTURE SURGERY     I & D EXTREMITY Right 07/01/2013   Procedure: IRRIGATION AND DEBRIDEMENT RIGHT LEG;  Surgeon: Newt Minion, MD;  Location: [place];  Service: Orthopedics;  Laterality: Right;   ORIF FEMUR FRACTURE Right 07/01/2013   Procedure: OPEN REDUCTION INTERNAL FIXATION (ORIF) DISTAL FEMUR FRACTURE;  Surgeon: Newt Minion, MD;  Location: [place];  Service: Orthopedics;  Laterality: Right;    There were no vitals filed for this visit.   Subjective Assessment - 04/08/21 1454     Subjective Relays she has difficulty with stairs but thats about it anymore.    Diagnostic tests xrays    Patient Stated Goals go up/down stairs like a regular person, not have as much pain in her back with daily actiivty    Pain Onset More than a month ago    Pain Onset More than a month ago                               [facility] Adult PT Treatment/Exercise - 04/08/21 0001       Neuro Re-ed    Neuro Re-ed Details  tandem walk at counter top, SLS (20 sec max)      Lumbar Exercises: Aerobic    Recumbent Bike L4 X10 min      Lumbar Exercises: Standing   Other Standing Lumbar Exercises 8 inch step ups fwd and lateral on Rt with one UE support X15, then 6 inch step up with Rt and no UE support X10, then step up with Rt and down in front with left X10 no UE support    Other Standing Lumbar Exercises hip 4 way with green X25 bilat                         PT Long Term Goals - 03/27/21 1109       PT LONG TERM GOAL #1   Title I with advanced HEP    Status On-going      PT LONG TERM GOAL #2   Title improve FOTO =/> 67    Status On-going      PT LONG TERM GOAL #3   Title increase Rt hip strength =/> 5/5 to allow alternating steps on stairs    Baseline now with 5/5 but Rt leg still funcionally weaker like with stairs  Status Partially Met      PT LONG TERM GOAL #4   Title demo good pelvic mobility to assist with stairs    Status Achieved      PT LONG TERM GOAL #5   Title report =/> 75% reduction of back pain with daily acitivty    Baseline relays 50-60% improved on 6/2    Status On-going      PT LONG TERM GOAL #6   Title report =/> 50% reduction in fear of falling    Baseline about the same fear of falling    Status On-going                   Plan - 04/08/21 1510     Clinical Impression Statement Session focused on improving her ability with stairs and overall Rt hip/knee strength to her tolerance. She did also show improvement in max SLS on Rt. Continue PT.    Personal Factors and Comorbidities Age    Examination-Activity Limitations Squat;Stairs    Examination-Participation Restrictions Other    Stability/Clinical Decision Making Stable/Uncomplicated    Rehab Potential Good    PT Frequency 2x / week    PT Duration 4 weeks    PT Treatment/Interventions Stair training;Taping;Patient/family education;Functional mobility training;Moist Heat;Traction;Passive range of motion;Therapeutic exercise;Cryotherapy;Electrical Stimulation;Neuromuscular  re-education;Balance training;Manual techniques;Dry needling;Spinal Manipulations    PT Next Visit Plan stairs training, LE strengtheing, balance.  hip and core stability    PT Home Exercise Plan [id number]    Consulted and Agree with Plan of Care Patient             Patient will benefit from skilled therapeutic intervention in order to improve the following deficits and impairments:  Decreased range of motion, Pain, Hypomobility, Postural dysfunction, Decreased strength  Visit Diagnosis: Muscle weakness (generalized)  Other abnormalities of gait and mobility  Chronic midline low back pain without sciatica     Problem List Patient Active Problem List   Diagnosis Date Noted   Hereditary hemochromatosis ([place]) 11/03/2017   Ankle pain, chronic, left 03/15/2012    [name] 04/08/2021, 3:11 PM  [facility] Physical Therapy [address] [place], [place], [address] Phone: [phone]   Fax:  [phone]  Name: [name] MRN: [id number] Date of Birth: July 08, 1951

## 2021-04-10 ENCOUNTER — Ambulatory Visit (INDEPENDENT_AMBULATORY_CARE_PROVIDER_SITE_OTHER): Payer: Medicare Other | Admitting: Physical Therapy

## 2021-04-10 ENCOUNTER — Other Ambulatory Visit: Payer: Self-pay

## 2021-04-10 DIAGNOSIS — R2689 Other abnormalities of gait and mobility: Secondary | ICD-10-CM | POA: Diagnosis not present

## 2021-04-10 DIAGNOSIS — J441 Chronic obstructive pulmonary disease with (acute) exacerbation: Secondary | ICD-10-CM | POA: Diagnosis not present

## 2021-04-10 DIAGNOSIS — M6281 Muscle weakness (generalized): Secondary | ICD-10-CM | POA: Diagnosis not present

## 2021-04-10 DIAGNOSIS — G8929 Other chronic pain: Secondary | ICD-10-CM

## 2021-04-10 DIAGNOSIS — M545 Low back pain, unspecified: Secondary | ICD-10-CM

## 2021-04-10 DIAGNOSIS — H16223 Keratoconjunctivitis sicca, not specified as Sjogren's, bilateral: Secondary | ICD-10-CM | POA: Diagnosis not present

## 2021-04-10 DIAGNOSIS — H1045 Other chronic allergic conjunctivitis: Secondary | ICD-10-CM | POA: Diagnosis not present

## 2021-04-10 NOTE — Therapy (Signed)
[facility] Physical Therapy [address] [place], [place], [address] Phone: [phone]   Fax:  [phone]  Physical Therapy Treatment  Patient Details  Name: [name] MRN: [id number] Date of Birth: [birth year] Referring Provider (PT): Dr Harless Nakayama   Encounter Date: 04/10/2021   PT End of Session - 04/10/21 0836     Visit Number 13    Number of Visits 16    Date for PT Re-Evaluation 04/24/21    Authorization Type BCBS    Progress Note Due on Visit 18    PT Start Time 0802    PT Stop Time 0842    PT Time Calculation (min) 40 min    Activity Tolerance Patient tolerated treatment well    Behavior During Therapy [facility] for tasks assessed/performed             Past Medical History:  Diagnosis Date   Diabetes mellitus without complication ([place])    Hypertension    Vitamin D deficiency 08/2017    Past Surgical History:  Procedure Laterality Date   FRACTURE SURGERY     I & D EXTREMITY Right 07/01/2013   Procedure: IRRIGATION AND DEBRIDEMENT RIGHT LEG;  Surgeon: Newt Minion, MD;  Location: [place];  Service: Orthopedics;  Laterality: Right;   ORIF FEMUR FRACTURE Right 07/01/2013   Procedure: OPEN REDUCTION INTERNAL FIXATION (ORIF) DISTAL FEMUR FRACTURE;  Surgeon: Newt Minion, MD;  Location: [place];  Service: Orthopedics;  Laterality: Right;    There were no vitals filed for this visit.   Subjective Assessment - 04/10/21 0836     Subjective Relays she has about 3/10 pain and stiffness first thing in the morning    Diagnostic tests xrays    Patient Stated Goals go up/down stairs like a regular person, not have as much pain in her back with daily actiivty    Pain Onset More than a month ago    Pain Onset More than a month ago              [facility] Adult PT Treatment/Exercise - 04/10/21 0001       Neuro Re-ed    Neuro Re-ed Details  fwd walk and lateral walk on foam beam X 5 round trips eat SLS (20 sec max)      Lumbar Exercises: Aerobic    Nustep L5X8 min      Lumbar Exercises: Machines for Strengthening   Cybex Knee Extension 10#bilat 3X10 up with both and lower with Rt only    Cybex Knee Flexion 35#3X10    Leg Press 125#DL 3X10, 75# Rt LE only 3X10      Lumbar Exercises: Standing   Other Standing Lumbar Exercises 8 inch step ups fwd and lateral on Rt with one UE support X10 ea, then 6 inch step up with Rt and no UE support X10, then step up with Rt and down in front with left X10 no UE support    Other Standing Lumbar Exercises hip 4 way with green X20 bilat                         PT Long Term Goals - 03/27/21 1109       PT LONG TERM GOAL #1   Title I with advanced HEP    Status On-going      PT LONG TERM GOAL #2   Title improve FOTO =/> 67    Status On-going  PT LONG TERM GOAL #3   Title increase Rt hip strength =/> 5/5 to allow alternating steps on stairs    Baseline now with 5/5 but Rt leg still funcionally weaker like with stairs    Status Partially Met      PT LONG TERM GOAL #4   Title demo good pelvic mobility to assist with stairs    Status Achieved      PT LONG TERM GOAL #5   Title report =/> 75% reduction of back pain with daily acitivty    Baseline relays 50-60% improved on 6/2    Status On-going      PT LONG TERM GOAL #6   Title report =/> 50% reduction in fear of falling    Baseline about the same fear of falling    Status On-going                   Plan - 04/10/21 3748     Clinical Impression Statement Continued to work to improve overall Rt LE strength, ability with stairs, and overall balance. She shows good efffort with PT and had good activity tolerance.    Personal Factors and Comorbidities Age    Examination-Activity Limitations Squat;Stairs    Examination-Participation Restrictions Other    Stability/Clinical Decision Making Stable/Uncomplicated    Rehab Potential Good    PT Frequency 2x / week    PT Duration 4 weeks    PT  Treatment/Interventions Stair training;Taping;Patient/family education;Functional mobility training;Moist Heat;Traction;Passive range of motion;Therapeutic exercise;Cryotherapy;Electrical Stimulation;Neuromuscular re-education;Balance training;Manual techniques;Dry needling;Spinal Manipulations    PT Next Visit Plan stairs training, LE strengtheing, balance.  hip and core stability    PT Home Exercise Plan [email or web address]    Consulted and Agree with Plan of Care Patient             Patient will benefit from skilled therapeutic intervention in order to improve the following deficits and impairments:  Decreased range of motion, Pain, Hypomobility, Postural dysfunction, Decreased strength  Visit Diagnosis: Muscle weakness (generalized)  Other abnormalities of gait and mobility  Chronic midline low back pain without sciatica     Problem List Patient Active Problem List   Diagnosis Date Noted   Hereditary hemochromatosis ([place]) 11/03/2017   Ankle pain, chronic, left 03/15/2012    [name] 04/10/2021, 8:41 AM  [facility] Physical Therapy [address] [place], [place], [address] Phone: [phone]   Fax:  [phone]  Name: Teagan Ozawa MRN: [id number] Date of Birth: 1950/11/10

## 2021-04-15 ENCOUNTER — Other Ambulatory Visit: Payer: Self-pay | Admitting: *Deleted

## 2021-04-15 ENCOUNTER — Other Ambulatory Visit: Payer: Self-pay

## 2021-04-15 ENCOUNTER — Ambulatory Visit (INDEPENDENT_AMBULATORY_CARE_PROVIDER_SITE_OTHER): Payer: Medicare Other | Admitting: Physical Therapy

## 2021-04-15 DIAGNOSIS — M545 Low back pain, unspecified: Secondary | ICD-10-CM | POA: Diagnosis not present

## 2021-04-15 DIAGNOSIS — M6281 Muscle weakness (generalized): Secondary | ICD-10-CM | POA: Diagnosis not present

## 2021-04-15 DIAGNOSIS — R2689 Other abnormalities of gait and mobility: Secondary | ICD-10-CM

## 2021-04-15 DIAGNOSIS — G8929 Other chronic pain: Secondary | ICD-10-CM

## 2021-04-15 NOTE — Therapy (Signed)
[name] Physical Therapy [address] [place], [place], [address] Phone: [phone]   Fax:  [phone]  Physical Therapy Treatment  Patient Details  Name: [name] MRN: [id number] Date of Birth: July 01, 1951 Referring Provider (PT): Dr Harless Nakayama   Encounter Date: 04/15/2021   PT End of Session - 04/15/21 0920     Visit Number 14    Number of Visits 16    Date for PT Re-Evaluation 04/24/21    Authorization Type BCBS    Progress Note Due on Visit 18    PT Start Time 0846    PT Stop Time 0925    PT Time Calculation (min) 39 min    Activity Tolerance Patient tolerated treatment well    Behavior During Therapy [facility] for tasks assessed/performed             Past Medical History:  Diagnosis Date   Diabetes mellitus without complication ([place])    Hypertension    Vitamin D deficiency 08/2017    Past Surgical History:  Procedure Laterality Date   FRACTURE SURGERY     I & D EXTREMITY Right 07/01/2013   Procedure: IRRIGATION AND DEBRIDEMENT RIGHT LEG;  Surgeon: Newt Minion, MD;  Location: [place];  Service: Orthopedics;  Laterality: Right;   ORIF FEMUR FRACTURE Right 07/01/2013   Procedure: OPEN REDUCTION INTERNAL FIXATION (ORIF) DISTAL FEMUR FRACTURE;  Surgeon: Newt Minion, MD;  Location: [place];  Service: Orthopedics;  Laterality: Right;    There were no vitals filed for this visit.   Subjective Assessment - 04/15/21 0919     Subjective Relays she is doing well today with her hip pain, says she was able to do 4 stairs without UE support so she is improving in this area    Diagnostic tests xrays    Patient Stated Goals go up/down stairs like a regular person, not have as much pain in her back with daily actiivty    Pain Onset More than a month ago    Pain Onset More than a month ago              [facility] Adult PT Treatment/Exercise - 04/15/21 0001       Neuro Re-ed    Neuro Re-ed Details  on foam pad: tandem balance and SLS       Lumbar Exercises: Aerobic   Recumbent Bike L3X8 min      Lumbar Exercises: Machines for Strengthening   Cybex Knee Extension 10#bilat 3X10 up with both and lower with Rt only    Cybex Knee Flexion 35#3X10    Leg Press 125#DL 3X10, 75# Rt LE only 3X10      Lumbar Exercises: Standing   Other Standing Lumbar Exercises 6 inch step up with Rt and no UE support X10, then step up with Rt and down in front with left X10 no UE support. Then 8 inch step ups fwd  on Rt able to progres to no UE support,                         PT Long Term Goals - 03/27/21 1109       PT LONG TERM GOAL #1   Title I with advanced HEP    Status On-going      PT LONG TERM GOAL #2   Title improve FOTO =/> 67    Status On-going      PT LONG TERM GOAL #3   Title  increase Rt hip strength =/> 5/5 to allow alternating steps on stairs    Baseline now with 5/5 but Rt leg still funcionally weaker like with stairs    Status Partially Met      PT LONG TERM GOAL #4   Title demo good pelvic mobility to assist with stairs    Status Achieved      PT LONG TERM GOAL #5   Title report =/> 75% reduction of back pain with daily acitivty    Baseline relays 50-60% improved on 6/2    Status On-going      PT LONG TERM GOAL #6   Title report =/> 50% reduction in fear of falling    Baseline about the same fear of falling    Status On-going                   Plan - 04/15/21 0920     Clinical Impression Statement She is doing very well lately with overall function and pain and likely will be able to discharge in 2 more visits now that stairs are getting easier for her and balance is improving    Personal Factors and Comorbidities Age    Examination-Activity Limitations Squat;Stairs    Examination-Participation Restrictions Other    Stability/Clinical Decision Making Stable/Uncomplicated    Rehab Potential Good    PT Frequency 2x / week    PT Duration 4 weeks    PT Treatment/Interventions Stair  training;Taping;Patient/family education;Functional mobility training;Moist Heat;Traction;Passive range of motion;Therapeutic exercise;Cryotherapy;Electrical Stimulation;Neuromuscular re-education;Balance training;Manual techniques;Dry needling;Spinal Manipulations    PT Next Visit Plan stairs training, LE strengtheing, balance.  hip and core stability    PT Home Exercise Plan [email or web address]    Consulted and Agree with Plan of Care Patient             Patient will benefit from skilled therapeutic intervention in order to improve the following deficits and impairments:  Decreased range of motion, Pain, Hypomobility, Postural dysfunction, Decreased strength  Visit Diagnosis: Muscle weakness (generalized)  Other abnormalities of gait and mobility  Chronic midline low back pain without sciatica     Problem List Patient Active Problem List   Diagnosis Date Noted   Hereditary hemochromatosis ([place]) 11/03/2017   Ankle pain, chronic, left 03/15/2012    [name] 04/15/2021, 9:24 AM  [facility] Physical Therapy [address] [place], [place], [address] Phone: [phone]   Fax:  [phone]  Name: [name] MRN: [id number] Date of Birth: [birth year]

## 2021-04-16 ENCOUNTER — Inpatient Hospital Stay: Payer: Medicare Other | Attending: Hematology and Oncology

## 2021-04-16 ENCOUNTER — Inpatient Hospital Stay: Payer: Medicare Other

## 2021-04-16 ENCOUNTER — Other Ambulatory Visit: Payer: Self-pay

## 2021-04-16 ENCOUNTER — Encounter: Payer: Self-pay | Admitting: Hematology and Oncology

## 2021-04-16 ENCOUNTER — Other Ambulatory Visit: Payer: Self-pay | Admitting: *Deleted

## 2021-04-16 DIAGNOSIS — R634 Abnormal weight loss: Secondary | ICD-10-CM | POA: Diagnosis not present

## 2021-04-16 DIAGNOSIS — Z79899 Other long term (current) drug therapy: Secondary | ICD-10-CM | POA: Diagnosis not present

## 2021-04-16 DIAGNOSIS — E119 Type 2 diabetes mellitus without complications: Secondary | ICD-10-CM | POA: Diagnosis not present

## 2021-04-16 LAB — CMP (CANCER CENTER ONLY)
ALT: 93 U/L — ABNORMAL HIGH (ref 0–44)
AST: 76 U/L — ABNORMAL HIGH (ref 15–41)
Albumin: 4.2 g/dL (ref 3.5–5.0)
Alkaline Phosphatase: 113 U/L (ref 38–126)
Anion gap: 14 (ref 5–15)
BUN: 23 mg/dL (ref 8–23)
CO2: 26 mmol/L (ref 22–32)
Calcium: 9.4 mg/dL (ref 8.9–10.3)
Chloride: 101 mmol/L (ref 98–111)
Creatinine: 0.74 mg/dL (ref 0.44–1.00)
GFR, Estimated: >60 mL/min (ref >60)
Glucose, Bld: 199 mg/dL — ABNORMAL HIGH (ref 70–99)
Potassium: 4 mmol/L (ref 3.5–5.1)
Sodium: 141 mmol/L (ref 135–145)
Total Bilirubin: 0.9 mg/dL (ref 0.3–1.2)
Total Protein: 7 g/dL (ref 6.5–8.1)

## 2021-04-16 LAB — IRON AND TIBC
Iron: 52 ug/dL (ref 41–142)
Saturation Ratios: 13 % — ABNORMAL LOW (ref 21–57)
TIBC: 400 ug/dL (ref 236–444)
UIBC: 348 ug/dL (ref 120–384)

## 2021-04-16 LAB — CBC WITH DIFFERENTIAL (CANCER CENTER ONLY)
Abs Immature Granulocytes: 0.03 10*3/uL (ref 0.00–0.07)
Basophils Absolute: 0 10*3/uL (ref 0.0–0.1)
Basophils Relative: 0 %
Eosinophils Absolute: 0 10*3/uL (ref 0.0–0.5)
Eosinophils Relative: 1 %
HCT: 43.6 % (ref 36.0–46.0)
Hemoglobin: 15.3 g/dL — ABNORMAL HIGH (ref 12.0–15.0)
Immature Granulocytes: 1 %
Lymphocytes Relative: 21 %
Lymphs Abs: 1.1 10*3/uL (ref 0.7–4.0)
MCH: 32.3 pg (ref 26.0–34.0)
MCHC: 35.1 g/dL (ref 30.0–36.0)
MCV: 92.2 fL (ref 80.0–100.0)
Monocytes Absolute: 0.5 10*3/uL (ref 0.1–1.0)
Monocytes Relative: 9 %
Neutro Abs: 3.8 10*3/uL (ref 1.7–7.7)
Neutrophils Relative %: 68 %
Platelet Count: 118 10*3/uL — ABNORMAL LOW (ref 150–400)
RBC: 4.73 MIL/uL (ref 3.87–5.11)
RDW: 12.8 % (ref 11.5–15.5)
WBC Count: 5.4 10*3/uL (ref 4.0–10.5)
nRBC: 0 % (ref 0.0–0.2)

## 2021-04-16 LAB — FERRITIN: Ferritin: 43 ng/mL (ref 11–307)

## 2021-04-16 NOTE — Patient Instructions (Signed)

## 2021-04-16 NOTE — Progress Notes (Signed)
MyChart virtual visit   Patient Care Team: Lawerance Cruel, MD as PCP - General (Family Medicine) Lavonna Monarch, MD as Consulting Physician (Dermatology)  DIAGNOSIS:    ICD-10-CM   1. Hereditary hemochromatosis ([place])  E83.110      CHIEF COMPLIANT: Follow-up of hereditary hemochromatosis  INTERVAL HISTORY: [name] is a 70 y.o. with above-mentioned history of  hereditary hemochromatosis on phlebotomies every 6 weeks. Labs on 06/29 showed : WBC 5.4K, Hg 15.3, HCT 43.6%,  platelets 118K, creatinine 0.74, AST 76, ALT 93, iron saturation 13%, ferritin 43. The patient presents over MyChart video today for follow-up. She tells me that she has changed her diet and eating less carbs and therefore she is losing weight as well as feeling better.  She had a phlebotomy yesterday.  ALLERGIES:  has No Known Allergies.  MEDICATIONS:  Current Outpatient Medications  Medication Sig Dispense Refill   albuterol (VENTOLIN HFA) 108 (90 Base) MCG/ACT inhaler 2 puffs as needed     alendronate (FOSAMAX) 70 MG tablet Take 1 tablet (70 mg total) by mouth once a week. Take with a full glass of water on an empty stomach.     amLODipine (NORVASC) 10 MG tablet Take 10 mg by mouth daily.     cholecalciferol (VITAMIN D) 1000 units tablet Take 5,000 Units by mouth daily.     Cyanocobalamin (VITAMIN B 12) 500 MCG TABS 1 tablet     losartan (COZAAR) 100 MG tablet Take 100 mg by mouth daily.     pravastatin (PRAVACHOL) 40 MG tablet Take 40 mg by mouth daily.     sertraline (ZOLOFT) 100 MG tablet      TRELEGY ELLIPTA 100-62.5-25 MCG/INH AEPB      No current facility-administered medications for this visit.    PHYSICAL EXAMINATION: ECOG PERFORMANCE STATUS: 1 - Symptomatic but completely ambulatory   LABORATORY DATA:  I have reviewed the data as listed CMP Latest Ref Rng & Units 04/16/2021 03/05/2021 01/21/2021  Glucose 70 - 99 mg/dL 199(H) 322(H) 201(H)  BUN 8 - 23 mg/dL 23 15 16   Creatinine 0.44 -  1.00 mg/dL 0.74 0.79 0.80  Sodium 135 - 145 mmol/L 141 141 138  Potassium 3.5 - 5.1 mmol/L 4.0 4.3 4.6  Chloride 98 - 111 mmol/L 101 100 102  CO2 22 - 32 mmol/L 26 28 22   Calcium 8.9 - 10.3 mg/dL 9.4 10.1 10.1  Total Protein 6.5 - 8.1 g/dL 7.0 6.8 6.8  Total Bilirubin 0.3 - 1.2 mg/dL 0.9 0.7 0.8  Alkaline Phos 38 - 126 U/L 113 151(H) 165(H)  AST 15 - 41 U/L 76(H) 82(H) 102(H)  ALT 0 - 44 U/L 93(H) 76(H) 126(H)    Lab Results  Component Value Date   WBC 5.4 04/16/2021   HGB 15.3 (H) 04/16/2021   HCT 43.6 04/16/2021   MCV 92.2 04/16/2021   PLT 118 (L) 04/16/2021   NEUTROABS 3.8 04/16/2021    ASSESSMENT & PLAN:  Hereditary hemochromatosis ([place]) Elevated liver function tests with elevated ferritin 08/2017 Ferritin : 918.9, iron saturation 43% 01/17/2018 ferritin 124, iron saturation 22% 05/30/2018: Ferritin 108, iron saturation 34% 09/21/2018: Ferritin 24, iron saturation 24% 04/05/2019: Ferritin 180, iron saturation 38% 06/29/2019: Ferritin 169, iron saturation 31% 10/02/2019: Ferritin 102, iron saturation 28% 02/05/2020: Ferritin 71, iron saturation 20% 07/23/2020: Ferritin 41, iron saturation 13% 01/23/2021: Ferritin 105, iron saturation 33%, AST 102, ALT 126, alkaline phosphatase 165, bilirubin 0.8 03/05/21: Ferritin 51, Iron sat 26%, Platelets 100, AST 82, ALT 76,  Alk Phos 151 04/15/2021: Ferritin 43, iron saturation 13%, hemoglobin 15.3, platelets 118 AST 76, ALT 93, hemoglobin A1c 7.4   Phlebotomy schedule: Will be changed to every 8 weeks Diabetes: Her A1c has improved to 7.4 (from 8.2) By cutting her carbohydrate intake she is doing very well from a diabetes standpoint.  She will continue to cut down her carbs and exercise.  She is very encouraged by the improvement. Fracture right inferior pubic ramus: Limiting her activities. Severe weight gain  We will be switching her phlebotomy schedule to every 8 weeks.  I will see her with a MyChart virtual visit day after her labs  in  2 months   No orders of the defined types were placed in this encounter.  The patient has a good understanding of the overall plan. she agrees with it. she will call with any problems that may develop before the next visit here.  Total time spent: 20 mins including face to face time and time spent for planning, charting and coordination of care  Rulon Eisenmenger, MD, MPH 04/17/2021  I, Reinaldo Raddle, am acting as scribe for Dr. Nicholas Lose, MD.  I have reviewed the above documentation for accuracy and completeness, and I agree with the above.

## 2021-04-16 NOTE — Progress Notes (Signed)
Pt presented for phlebotomy procedure today. Ok to proceed per Dr.Gudena. 16g in RAC used, procedure started at 1324 and ended at 1329, 1 unit (515g) removed. Pt offered drink, snack, and AVS. Pt declined AVS and to stay for 30 min observation. VSS throughout and pt discharged in stable condition, ambulatory to lobby.

## 2021-04-17 ENCOUNTER — Inpatient Hospital Stay (HOSPITAL_BASED_OUTPATIENT_CLINIC_OR_DEPARTMENT_OTHER): Payer: Medicare Other | Admitting: Hematology and Oncology

## 2021-04-17 ENCOUNTER — Ambulatory Visit (INDEPENDENT_AMBULATORY_CARE_PROVIDER_SITE_OTHER): Payer: Medicare Other | Admitting: Physical Therapy

## 2021-04-17 ENCOUNTER — Other Ambulatory Visit: Payer: Self-pay

## 2021-04-17 DIAGNOSIS — M6281 Muscle weakness (generalized): Secondary | ICD-10-CM

## 2021-04-17 DIAGNOSIS — G8929 Other chronic pain: Secondary | ICD-10-CM

## 2021-04-17 DIAGNOSIS — M545 Low back pain, unspecified: Secondary | ICD-10-CM | POA: Diagnosis not present

## 2021-04-17 DIAGNOSIS — R2689 Other abnormalities of gait and mobility: Secondary | ICD-10-CM

## 2021-04-17 LAB — HEMOGLOBIN A1C
Hgb A1c MFr Bld: 7.4 % — ABNORMAL HIGH (ref 4.8–5.6)
Mean Plasma Glucose: 166 mg/dL

## 2021-04-17 NOTE — Therapy (Signed)
[facility] Physical Therapy [address] [place], [place], [address] Phone: [phone]   Fax:  [phone]  Physical Therapy Treatment  Patient Details  Name: [name] MRN: [id number] Date of Birth: 09-Feb-1951 Referring Provider (PT): Dr Harless Nakayama   Encounter Date: 04/17/2021   PT End of Session - 04/17/21 0816     Visit Number 15    Number of Visits 16    Date for PT Re-Evaluation 04/24/21    Authorization Type BCBS    Progress Note Due on Visit 18    PT Start Time 0807    PT Stop Time 0845    PT Time Calculation (min) 38 min    Activity Tolerance Patient tolerated treatment well    Behavior During Therapy [facility] for tasks assessed/performed             Past Medical History:  Diagnosis Date   Diabetes mellitus without complication ([place])    Hypertension    Vitamin D deficiency 08/2017    Past Surgical History:  Procedure Laterality Date   FRACTURE SURGERY     I & D EXTREMITY Right 07/01/2013   Procedure: IRRIGATION AND DEBRIDEMENT RIGHT LEG;  Surgeon: Newt Minion, MD;  Location: [place];  Service: Orthopedics;  Laterality: Right;   ORIF FEMUR FRACTURE Right 07/01/2013   Procedure: OPEN REDUCTION INTERNAL FIXATION (ORIF) DISTAL FEMUR FRACTURE;  Surgeon: Newt Minion, MD;  Location: [place];  Service: Orthopedics;  Laterality: Right;    There were no vitals filed for this visit.   Subjective Assessment - 04/17/21 0815     Subjective Denies pain upon arrival.    Diagnostic tests xrays    Patient Stated Goals go up/down stairs like a regular person, not have as much pain in her back with daily actiivty    Pain Onset More than a month ago    Pain Onset More than a month ago                               [facility] Adult PT Treatment/Exercise - 04/17/21 0001       Neuro Re-ed    Neuro Re-ed Details  on foam pad: tandem balance then feet together and eyes closed, then on floor performed SLS with cone taps X 5 bilat.       Lumbar Exercises: Aerobic   Recumbent Bike L4X8 mi      Lumbar Exercises: Machines for Strengthening   Leg Press 125#DL 3X10, 75# Rt LE only 3X10      Lumbar Exercises: Standing   Other Standing Lumbar Exercises up/down 2 flights of stairs in clinic reciprocal pattern, one handrail on way down, no handrail on way up    Other Standing Lumbar Exercises hip 4 way with green X15 bilat      Lumbar Exercises: Seated   Sit to Stand 15 reps    Sit to Stand Limitations no UE support                         PT Long Term Goals - 03/27/21 1109       PT LONG TERM GOAL #1   Title I with advanced HEP    Status On-going      PT LONG TERM GOAL #2   Title improve FOTO =/> 67    Status On-going      PT LONG TERM GOAL #3  Title increase Rt hip strength =/> 5/5 to allow alternating steps on stairs    Baseline now with 5/5 but Rt leg still funcionally weaker like with stairs    Status Partially Met      PT LONG TERM GOAL #4   Title demo good pelvic mobility to assist with stairs    Status Achieved      PT LONG TERM GOAL #5   Title report =/> 75% reduction of back pain with daily acitivty    Baseline relays 50-60% improved on 6/2    Status On-going      PT LONG TERM GOAL #6   Title report =/> 50% reduction in fear of falling    Baseline about the same fear of falling    Status On-going                   Plan - 04/17/21 0817     Clinical Impression Statement She continues to do well and has made great progress with PT, we willl look at goals and assess for discharge vs recert next visit.    Personal Factors and Comorbidities Age    Examination-Activity Limitations Squat;Stairs    Examination-Participation Restrictions Other    Stability/Clinical Decision Making Stable/Uncomplicated    Rehab Potential Good    PT Frequency 2x / week    PT Duration 4 weeks    PT Treatment/Interventions Stair training;Taping;Patient/family education;Functional mobility  training;Moist Heat;Traction;Passive range of motion;Therapeutic exercise;Cryotherapy;Electrical Stimulation;Neuromuscular re-education;Balance training;Manual techniques;Dry needling;Spinal Manipulations    PT Next Visit Plan DC vs recert    PT Home Exercise Plan [email or web address]    Consulted and Agree with Plan of Care Patient             Patient will benefit from skilled therapeutic intervention in order to improve the following deficits and impairments:  Decreased range of motion, Pain, Hypomobility, Postural dysfunction, Decreased strength  Visit Diagnosis: Muscle weakness (generalized)  Other abnormalities of gait and mobility  Chronic midline low back pain without sciatica     Problem List Patient Active Problem List   Diagnosis Date Noted   Hereditary hemochromatosis ([place]) 11/03/2017   Ankle pain, chronic, left 03/15/2012    [name] 04/17/2021, 8:39 AM  [facility] Physical Therapy [address] [place], [place], [address] Phone: [phone]   Fax:  [phone]  Name: [name] MRN: [id number] Date of Birth: 12/08/1950

## 2021-04-17 NOTE — Assessment & Plan Note (Signed)
Elevated liver function tests with elevated ferritin 08/2017 Ferritin : 918.9, iron saturation 43% 01/17/2018 ferritin 124, iron saturation 22% 05/30/2018: Ferritin 108, iron saturation 34% 09/21/2018: Ferritin 24, iron saturation 24% 04/05/2019:Ferritin 180, iron saturation 38% 06/29/2019: Ferritin 169, iron saturation 31% 10/02/2019: Ferritin 102, iron saturation 28% 02/05/2020: Ferritin 71, iron saturation 20% 07/23/2020: Ferritin 41, iron saturation 13% 01/23/2021: Ferritin 105, iron saturation 33%, AST 102, ALT 126, alkaline phosphatase 165, bilirubin 0.8 03/05/21: Ferritin 51, Iron sat 26%, Platelets 100, AST 82, ALT 76, Alk Phos 151 04/15/2021: Ferritin 43, iron saturation 13%, hemoglobin 15.3, platelets 118 AST 76, ALT 93, hemoglobin A1c 7.4  Phlebotomy schedule: Every 8 weeks Diabetes: Her A1c has improved to 7.4 (from 8.2) Fracture right inferior pubic ramus: Limiting her activities. Severe weight gain  We will be switching her phlebotomy schedule to every 8 weeks.  Return to clinic in 6 months with labs and follow-up

## 2021-04-18 ENCOUNTER — Telehealth: Payer: Self-pay | Admitting: Hematology and Oncology

## 2021-04-18 NOTE — Telephone Encounter (Signed)
Scheduled per 6/30 los. Called pt and left a msg  

## 2021-04-23 ENCOUNTER — Encounter: Payer: Medicare Other | Admitting: Physical Therapy

## 2021-04-29 ENCOUNTER — Encounter: Payer: Self-pay | Admitting: Hematology and Oncology

## 2021-04-29 ENCOUNTER — Other Ambulatory Visit: Payer: Self-pay | Admitting: *Deleted

## 2021-05-07 ENCOUNTER — Ambulatory Visit: Payer: Medicare Other | Admitting: Physical Therapy

## 2021-05-07 ENCOUNTER — Encounter: Payer: Self-pay | Admitting: Physical Therapy

## 2021-05-07 ENCOUNTER — Other Ambulatory Visit: Payer: Self-pay

## 2021-05-07 DIAGNOSIS — R2689 Other abnormalities of gait and mobility: Secondary | ICD-10-CM

## 2021-05-07 DIAGNOSIS — M6281 Muscle weakness (generalized): Secondary | ICD-10-CM | POA: Diagnosis not present

## 2021-05-07 DIAGNOSIS — M545 Low back pain, unspecified: Secondary | ICD-10-CM | POA: Diagnosis not present

## 2021-05-07 DIAGNOSIS — G8929 Other chronic pain: Secondary | ICD-10-CM | POA: Diagnosis not present

## 2021-05-07 NOTE — Therapy (Signed)
[facility] Physical Therapy [address] [place], [place], [address] Phone: [phone]   Fax:  [phone]  Physical Therapy Treatment/Progress note/recert Progress Note reporting period date 03/20/21 to 05/07/21  See below for objective and subjective measurements relating to patients progress with PT.   Patient Details  Name: [name] MRN: [id number] Date of Birth: [birth year] Referring Provider (PT): Dr Harless Nakayama   Encounter Date: 05/07/2021   PT End of Session - 05/07/21 0951     Visit Number 16    Number of Visits 24    Date for PT Re-Evaluation 06/11/21    Authorization Type BCBS    Progress Note Due on Visit 26    PT Start Time 0925    PT Stop Time 1005    PT Time Calculation (min) 40 min    Activity Tolerance Patient tolerated treatment well    Behavior During Therapy [facility] for tasks assessed/performed             Past Medical History:  Diagnosis Date   Diabetes mellitus without complication ([place])    Hypertension    Vitamin D deficiency 08/2017    Past Surgical History:  Procedure Laterality Date   FRACTURE SURGERY     I & D EXTREMITY Right 07/01/2013   Procedure: IRRIGATION AND DEBRIDEMENT RIGHT LEG;  Surgeon: Newt Minion, MD;  Location: [place];  Service: Orthopedics;  Laterality: Right;   ORIF FEMUR FRACTURE Right 07/01/2013   Procedure: OPEN REDUCTION INTERNAL FIXATION (ORIF) DISTAL FEMUR FRACTURE;  Surgeon: Newt Minion, MD;  Location: [place];  Service: Orthopedics;  Laterality: Right;    There were no vitals filed for this visit.   Subjective Assessment - 05/07/21 0931     Subjective She relays she slipped on hardwood floor while cleaning the floors wearing socks and she did a split causing her Rt medial knee to hit the floor. She has been in a lot of knee pain since but now this has calmed down. She feels this set back her progress and would like to extend her PT.    Diagnostic tests xrays    Patient Stated Goals go  up/down stairs like a regular person, not have as much pain in her back with daily actiivty    Pain Onset More than a month ago    Pain Onset More than a month ago                [facility] PT Assessment - 05/07/21 0001       Assessment   Medical Diagnosis Nondisplaced fx rt pubis and chronic LBP    Referring Provider (PT) Dr Harless Nakayama    Onset Date/Surgical Date 01/14/21      Observation/Other Assessments   Focus on Therapeutic Outcomes (FOTO)  55, goal 3      Squat   Comments decreased weight shift to Rt      AROM   Overall AROM Comments lumbar ROM now WNL      Strength   Overall Strength Comments 4+/5 Rt leg strength, 5/5 Lt leg strength grossly                           [facility] Adult PT Treatment/Exercise - 05/07/21 0001       Lumbar Exercises: Aerobic   Recumbent Bike L3X9 min      Lumbar Exercises: Machines for Strengthening   Cybex Knee Extension 15# 3X10    Cybex Knee  Flexion 35#3X10    Leg Press 125#DL 3X10, 75# Rt LE only 3X10      Lumbar Exercises: Standing   Other Standing Lumbar Exercises 6 inch step ups one UE support up with Rt and down in front with left, 10 reps    Other Standing Lumbar Exercises TRX squats 3X10, TRX lunges  X10 bilat                         PT Long Term Goals - 05/07/21 8127       PT LONG TERM GOAL #1   Title I with advanced HEP    Status On-going      PT LONG TERM GOAL #2   Title improve FOTO =/> 67    Baseline now 55    Status On-going      PT LONG TERM GOAL #3   Title increase Rt hip strength =/> 5/5 to allow alternating steps on stairs    Baseline 4+    Status On-going      PT LONG TERM GOAL #4   Title demo good pelvic mobility to assist with stairs    Baseline still with difficulty with stairs    Status On-going      PT LONG TERM GOAL #5   Title report =/> 75% reduction of back pain with daily acitivty    Status Achieved      PT LONG TERM GOAL #6   Title report =/> 50%  reduction in fear of falling    Baseline about the same fear of falling    Status On-going                   Plan - 05/07/21 0955     Clinical Impression Statement She had set back after slip and fall 2 weeks ago. Her knee pain has overall calmed down but she still lacks overall functional strength for Rt hip/knee and has continued difficulty with stairs. PT recommending to extend PT 4 more weeks to maximize her function and allow her to reach her PT goals.    Personal Factors and Comorbidities Age    Examination-Activity Limitations Squat;Stairs    Examination-Participation Restrictions Other    Stability/Clinical Decision Making Stable/Uncomplicated    Rehab Potential Good    PT Frequency 2x / week    PT Duration 4 weeks    PT Treatment/Interventions Stair training;Taping;Patient/family education;Functional mobility training;Moist Heat;Traction;Passive range of motion;Therapeutic exercise;Cryotherapy;Electrical Stimulation;Neuromuscular re-education;Balance training;Manual techniques;Dry needling;Spinal Manipulations    PT Next Visit Plan focus on Rt hip/knee strength and stairs as tolerated    PT Home Exercise Plan [email or web address]    Consulted and Agree with Plan of Care Patient             Patient will benefit from skilled therapeutic intervention in order to improve the following deficits and impairments:  Decreased range of motion, Pain, Hypomobility, Postural dysfunction, Decreased strength  Visit Diagnosis: Muscle weakness (generalized)  Other abnormalities of gait and mobility  Chronic midline low back pain without sciatica     Problem List Patient Active Problem List   Diagnosis Date Noted   Hereditary hemochromatosis ([place]) 11/03/2017   Ankle pain, chronic, left 03/15/2012    [name] 05/07/2021, 10:04 AM  [facility] Physical Therapy [address] [place], [place], [address] Phone: [phone]   Fax:  [phone]  Name:  [name] MRN: [id number] Date of Birth: [birth year]

## 2021-05-13 ENCOUNTER — Other Ambulatory Visit: Payer: Self-pay

## 2021-05-13 ENCOUNTER — Ambulatory Visit (INDEPENDENT_AMBULATORY_CARE_PROVIDER_SITE_OTHER): Payer: Medicare Other | Admitting: Physical Therapy

## 2021-05-13 DIAGNOSIS — M545 Low back pain, unspecified: Secondary | ICD-10-CM

## 2021-05-13 DIAGNOSIS — M6281 Muscle weakness (generalized): Secondary | ICD-10-CM

## 2021-05-13 DIAGNOSIS — R2689 Other abnormalities of gait and mobility: Secondary | ICD-10-CM | POA: Diagnosis not present

## 2021-05-13 DIAGNOSIS — G8929 Other chronic pain: Secondary | ICD-10-CM | POA: Diagnosis not present

## 2021-05-13 NOTE — Therapy (Signed)
[facility] Physical Therapy [address] [place], [place], [address] Phone: [phone]   Fax:  [phone]  Physical Therapy Treatment  Patient Details  Name: [name] MRN: [id number] Date of Birth: [birth year] Referring Provider (PT): Dr Harless Nakayama   Encounter Date: 05/13/2021   PT End of Session - 05/13/21 1524     Visit Number 17    Number of Visits 24    Date for PT Re-Evaluation 06/11/21    Authorization Type BCBS    Progress Note Due on Visit 26    PT Start Time 1515    PT Stop Time [id number]    PT Time Calculation (min) 40 min    Activity Tolerance Patient tolerated treatment well    Behavior During Therapy [facility] for tasks assessed/performed             Past Medical History:  Diagnosis Date   Diabetes mellitus without complication ([place])    Hypertension    Vitamin D deficiency 08/2017    Past Surgical History:  Procedure Laterality Date   FRACTURE SURGERY     I & D EXTREMITY Right 07/01/2013   Procedure: IRRIGATION AND DEBRIDEMENT RIGHT LEG;  Surgeon: Newt Minion, MD;  Location: [place];  Service: Orthopedics;  Laterality: Right;   ORIF FEMUR FRACTURE Right 07/01/2013   Procedure: OPEN REDUCTION INTERNAL FIXATION (ORIF) DISTAL FEMUR FRACTURE;  Surgeon: Newt Minion, MD;  Location: [place];  Service: Orthopedics;  Laterality: Right;    There were no vitals filed for this visit.   Subjective Assessment - 05/13/21 1523     Subjective She relays overall doing well with pain but does have leg pain first thing in the morning still. She also still has difficulty descending stairs    Diagnostic tests xrays    Patient Stated Goals go up/down stairs like a regular person, not have as much pain in her back with daily actiivty    Pain Onset More than a month ago    Pain Onset More than a month ago                               [facility] Adult PT Treatment/Exercise - 05/13/21 0001       Lumbar Exercises: Aerobic   Nustep  L5X9 min      Lumbar Exercises: Machines for Strengthening   Cybex Knee Extension 15# 3X10 DL, then Rt leg 5# 4X10    Cybex Knee Flexion 35#3X10    Leg Press 125#DL 3X10, 75# Rt LE only 3X10      Lumbar Exercises: Standing   Other Standing Lumbar Exercises 6 inch step ups one UE support up with Rt and down in front with left, 15 reps. HIp abd and extensions green X 15 ea bilat    Other Standing Lumbar Exercises TRX squats 2X10, TRX lunges  X10 bilat                         PT Long Term Goals - 05/07/21 [id number]       PT LONG TERM GOAL #1   Title I with advanced HEP    Status On-going      PT LONG TERM GOAL #2   Title improve FOTO =/> 67    Baseline now 55    Status On-going      PT LONG TERM GOAL #3   Title increase Rt hip strength =/>  5/5 to allow alternating steps on stairs    Baseline 4+    Status On-going      PT LONG TERM GOAL #4   Title demo good pelvic mobility to assist with stairs    Baseline still with difficulty with stairs    Status On-going      PT LONG TERM GOAL #5   Title report =/> 75% reduction of back pain with daily acitivty    Status Achieved      PT LONG TERM GOAL #6   Title report =/> 50% reduction in fear of falling    Baseline about the same fear of falling    Status On-going                   Plan - 05/13/21 1525     Clinical Impression Statement Progressed her strength progam to her overall toelrance, she does still lack Rt knee strength overall and we will continue to focus on improving this.    Personal Factors and Comorbidities Age    Examination-Activity Limitations Squat;Stairs    Examination-Participation Restrictions Other    Stability/Clinical Decision Making Stable/Uncomplicated    Rehab Potential Good    PT Frequency 2x / week    PT Duration 4 weeks    PT Treatment/Interventions Stair training;Taping;Patient/family education;Functional mobility training;Moist Heat;Traction;Passive range of  motion;Therapeutic exercise;Cryotherapy;Electrical Stimulation;Neuromuscular re-education;Balance training;Manual techniques;Dry needling;Spinal Manipulations    PT Next Visit Plan focus on Rt hip/knee strength and stairs as tolerated    PT Home Exercise Plan [email or web address]    Consulted and Agree with Plan of Care Patient             Patient will benefit from skilled therapeutic intervention in order to improve the following deficits and impairments:  Decreased range of motion, Pain, Hypomobility, Postural dysfunction, Decreased strength  Visit Diagnosis: Muscle weakness (generalized)  Other abnormalities of gait and mobility  Chronic midline low back pain without sciatica     Problem List Patient Active Problem List   Diagnosis Date Noted   Hereditary hemochromatosis ([place]) 11/03/2017   Ankle pain, chronic, left 03/15/2012    [name] 05/13/2021, 3:26 PM  [facility] Physical Therapy [address] [place], [place], [address] Phone: [phone]   Fax:  [phone]  Name: [name] MRN: [id number] Date of Birth: 1951-01-01

## 2021-05-19 ENCOUNTER — Other Ambulatory Visit: Payer: Self-pay

## 2021-05-19 ENCOUNTER — Ambulatory Visit: Payer: Medicare Other | Admitting: Physical Therapy

## 2021-05-19 DIAGNOSIS — M545 Low back pain, unspecified: Secondary | ICD-10-CM | POA: Diagnosis not present

## 2021-05-19 DIAGNOSIS — M6281 Muscle weakness (generalized): Secondary | ICD-10-CM | POA: Diagnosis not present

## 2021-05-19 DIAGNOSIS — G8929 Other chronic pain: Secondary | ICD-10-CM

## 2021-05-19 NOTE — Therapy (Signed)
[facility] Physical Therapy [address] [place], [place], [address] Phone: [phone]   Fax:  [phone]  Physical Therapy Treatment  Patient Details  Name: [name] MRN: [id number] Date of Birth: 23-Nov-1950 Referring Provider (PT): Dr Harless Nakayama   Encounter Date: 05/19/2021   PT End of Session - 05/19/21 1002     Visit Number 18    Number of Visits 24    Date for PT Re-Evaluation 06/11/21    Authorization Type BCBS    Progress Note Due on Visit 26    PT Start Time 0932    PT Stop Time 1010    PT Time Calculation (min) 38 min    Activity Tolerance Patient tolerated treatment well    Behavior During Therapy [facility] for tasks assessed/performed             Past Medical History:  Diagnosis Date   Diabetes mellitus without complication ([place])    Hypertension    Vitamin D deficiency 08/2017    Past Surgical History:  Procedure Laterality Date   FRACTURE SURGERY     I & D EXTREMITY Right 07/01/2013   Procedure: IRRIGATION AND DEBRIDEMENT RIGHT LEG;  Surgeon: Newt Minion, MD;  Location: [place];  Service: Orthopedics;  Laterality: Right;   ORIF FEMUR FRACTURE Right 07/01/2013   Procedure: OPEN REDUCTION INTERNAL FIXATION (ORIF) DISTAL FEMUR FRACTURE;  Surgeon: Newt Minion, MD;  Location: [place];  Service: Orthopedics;  Laterality: Right;    There were no vitals filed for this visit.   Subjective Assessment - 05/19/21 0952     Subjective She relays overall doing well with pain but does have leg pain first thing in the morning still. She also still has difficulty descending stairs    Diagnostic tests xrays    Patient Stated Goals go up/down stairs like a regular person, not have as much pain in her back with daily actiivty    Pain Onset More than a month ago    Pain Onset More than a month ago                [facility] PT Assessment - 05/19/21 0001       Strength   Overall Strength Comments 5- Rt leg strength, 5 Lt leg strength grossly                            [facility] Adult PT Treatment/Exercise - 05/19/21 0001       Lumbar Exercises: Stretches   Gastroc Stretch Limitations slantboard 30 sec X 3      Lumbar Exercises: Aerobic   Recumbent Bike L3X10 min      Lumbar Exercises: Machines for Strengthening   Cybex Knee Extension 15# 3X10 DL, then Rt leg 5# 3X10    Cybex Knee Flexion 35#3X10    Leg Press 131#DL 3X10, 81# Rt LE only 3X10      Lumbar Exercises: Standing   Other Standing Lumbar Exercises 6 inch step ups no UE support up with Rt and down in front with left, 16 reps. HIp abd and extensions green X 20 ea bilat    Other Standing Lumbar Exercises --                         PT Long Term Goals - 05/07/21 [id number]       PT LONG TERM GOAL #1   Title I with advanced HEP  Status On-going      PT LONG TERM GOAL #2   Title improve FOTO =/> 67    Baseline now 55    Status On-going      PT LONG TERM GOAL #3   Title increase Rt hip strength =/> 5/5 to allow alternating steps on stairs    Baseline 4+    Status On-going      PT LONG TERM GOAL #4   Title demo good pelvic mobility to assist with stairs    Baseline still with difficulty with stairs    Status On-going      PT LONG TERM GOAL #5   Title report =/> 75% reduction of back pain with daily acitivty    Status Achieved      PT LONG TERM GOAL #6   Title report =/> 50% reduction in fear of falling    Baseline about the same fear of falling    Status On-going                   Plan - 05/19/21 1004     Clinical Impression Statement Overall doing well but continues to have difficulty with stiars, I feel this will improve as we continue to strengthen and work on her confidence with this in PT.    Personal Factors and Comorbidities Age    Examination-Activity Limitations Squat;Stairs    Examination-Participation Restrictions Other    Stability/Clinical Decision Making Stable/Uncomplicated    Rehab Potential  Good    PT Frequency 2x / week    PT Duration 4 weeks    PT Treatment/Interventions Stair training;Taping;Patient/family education;Functional mobility training;Moist Heat;Traction;Passive range of motion;Therapeutic exercise;Cryotherapy;Electrical Stimulation;Neuromuscular re-education;Balance training;Manual techniques;Dry needling;Spinal Manipulations    PT Next Visit Plan focus on Rt hip/knee strength and stairs as tolerated    PT Home Exercise Plan [email or web address]    Consulted and Agree with Plan of Care Patient             Patient will benefit from skilled therapeutic intervention in order to improve the following deficits and impairments:  Decreased range of motion, Pain, Hypomobility, Postural dysfunction, Decreased strength  Visit Diagnosis: Muscle weakness (generalized)  Chronic midline low back pain without sciatica     Problem List Patient Active Problem List   Diagnosis Date Noted   Hereditary hemochromatosis ([place]) 11/03/2017   Ankle pain, chronic, left 03/15/2012    [name] 05/19/2021, 10:15 AM  [facility] Physical Therapy [address] [place], [place], [address] Phone: [phone]   Fax:  [phone]  Name: [name] MRN: [id number] Date of Birth: 1951/02/02

## 2021-05-22 ENCOUNTER — Other Ambulatory Visit: Payer: Self-pay

## 2021-05-22 ENCOUNTER — Ambulatory Visit: Payer: Medicare Other | Admitting: Physical Therapy

## 2021-05-22 DIAGNOSIS — M6281 Muscle weakness (generalized): Secondary | ICD-10-CM

## 2021-05-22 DIAGNOSIS — R2689 Other abnormalities of gait and mobility: Secondary | ICD-10-CM

## 2021-05-22 DIAGNOSIS — G8929 Other chronic pain: Secondary | ICD-10-CM

## 2021-05-22 DIAGNOSIS — M545 Low back pain, unspecified: Secondary | ICD-10-CM

## 2021-05-22 NOTE — Therapy (Signed)
[facility] Physical Therapy [address] [place], [place], [address] Phone: [phone]   Fax:  [phone]  Physical Therapy Treatment  Patient Details  Name: [name] MRN: [id number] Date of Birth: 10/15/1951 Referring Provider (PT): Dr Harless Nakayama   Encounter Date: 05/22/2021   PT End of Session - 05/22/21 1223     Visit Number 19    Number of Visits 24    Date for PT Re-Evaluation 06/11/21    Authorization Type BCBS    Progress Note Due on Visit 26    PT Start Time 1140    PT Stop Time 1220    PT Time Calculation (min) 40 min    Activity Tolerance Patient tolerated treatment well    Behavior During Therapy [facility] for tasks assessed/performed             Past Medical History:  Diagnosis Date   Diabetes mellitus without complication ([place])    Hypertension    Vitamin D deficiency 08/2017    Past Surgical History:  Procedure Laterality Date   FRACTURE SURGERY     I & D EXTREMITY Right 07/01/2013   Procedure: IRRIGATION AND DEBRIDEMENT RIGHT LEG;  Surgeon: Newt Minion, MD;  Location: [place];  Service: Orthopedics;  Laterality: Right;   ORIF FEMUR FRACTURE Right 07/01/2013   Procedure: OPEN REDUCTION INTERNAL FIXATION (ORIF) DISTAL FEMUR FRACTURE;  Surgeon: Newt Minion, MD;  Location: [place];  Service: Orthopedics;  Laterality: Right;    There were no vitals filed for this visit.   Subjective Assessment - 05/22/21 1208     Subjective She denies pain, says stairs are getting a little easier    Diagnostic tests xrays    Patient Stated Goals go up/down stairs like a regular person, not have as much pain in her back with daily actiivty    Pain Onset More than a month ago    Pain Onset More than a month ago              [facility] Adult PT Treatment/Exercise - 05/22/21 0001       Lumbar Exercises: Stretches   Gastroc Stretch Limitations slantboard 30 sec X 3      Lumbar Exercises: Aerobic   Recumbent Bike L3X10 min      Lumbar  Exercises: Machines for Strengthening   Cybex Knee Extension 20# 3X10 DL, then Rt leg 5# 3X10    Cybex Knee Flexion 35#3X10      Lumbar Exercises: Standing   Other Standing Lumbar Exercises up/down 2 flights of stairs in clinic hall one handrail reciprocal pattern. Tandem walk 3 round trips at counter top    Other Standing Lumbar Exercises TRX squats X10, TRX lunges  X10 bilat, standing hip abd and ext green X20 ea bilat                         PT Long Term Goals - 05/07/21 [id number]       PT LONG TERM GOAL #1   Title I with advanced HEP    Status On-going      PT LONG TERM GOAL #2   Title improve FOTO =/> 67    Baseline now 55    Status On-going      PT LONG TERM GOAL #3   Title increase Rt hip strength =/> 5/5 to allow alternating steps on stairs    Baseline 4+    Status On-going  PT LONG TERM GOAL #4   Title demo good pelvic mobility to assist with stairs    Baseline still with difficulty with stairs    Status On-going      PT LONG TERM GOAL #5   Title report =/> 75% reduction of back pain with daily acitivty    Status Achieved      PT LONG TERM GOAL #6   Title report =/> 50% reduction in fear of falling    Baseline about the same fear of falling    Status On-going                   Plan - 05/22/21 1224     Clinical Impression Statement She is showing some improvements with stairs demonstrated today in clinic stairs in hallway. We will continue to work to improve overalll function and balance as she can tolerate. She also relays she will join [employer] today so this will likely aide overall functional progress as well    Personal Factors and Comorbidities Age    Examination-Activity Limitations Squat;Stairs    Examination-Participation Restrictions Other    Stability/Clinical Decision Making Stable/Uncomplicated    Rehab Potential Good    PT Frequency 2x / week    PT Duration 4 weeks    PT Treatment/Interventions Stair  training;Taping;Patient/family education;Functional mobility training;Moist Heat;Traction;Passive range of motion;Therapeutic exercise;Cryotherapy;Electrical Stimulation;Neuromuscular re-education;Balance training;Manual techniques;Dry needling;Spinal Manipulations    PT Next Visit Plan focus on Rt hip/knee strength and stairs as tolerated    PT Home Exercise Plan [id number]    Consulted and Agree with Plan of Care Patient             Patient will benefit from skilled therapeutic intervention in order to improve the following deficits and impairments:  Decreased range of motion, Pain, Hypomobility, Postural dysfunction, Decreased strength  Visit Diagnosis: Muscle weakness (generalized)  Chronic midline low back pain without sciatica  Other abnormalities of gait and mobility     Problem List Patient Active Problem List   Diagnosis Date Noted   Hereditary hemochromatosis ([place]) 11/03/2017   Ankle pain, chronic, left 03/15/2012    [name] 05/22/2021, 12:26 PM  [facility] Physical Therapy [address] [place], [place], [address] Phone: [phone]   Fax:  [phone]  Name: [name] MRN: [id number] Date of Birth: 11/05/1950

## 2021-05-26 ENCOUNTER — Other Ambulatory Visit: Payer: Self-pay

## 2021-05-26 ENCOUNTER — Encounter: Payer: Self-pay | Admitting: Physical Therapy

## 2021-05-26 ENCOUNTER — Ambulatory Visit (INDEPENDENT_AMBULATORY_CARE_PROVIDER_SITE_OTHER): Payer: Medicare Other | Admitting: Physical Therapy

## 2021-05-26 DIAGNOSIS — M545 Low back pain, unspecified: Secondary | ICD-10-CM | POA: Diagnosis not present

## 2021-05-26 DIAGNOSIS — R2689 Other abnormalities of gait and mobility: Secondary | ICD-10-CM

## 2021-05-26 DIAGNOSIS — G8929 Other chronic pain: Secondary | ICD-10-CM | POA: Diagnosis not present

## 2021-05-26 DIAGNOSIS — M6281 Muscle weakness (generalized): Secondary | ICD-10-CM

## 2021-05-26 NOTE — Therapy (Signed)
[facility] [address] [place], [place], [address] Phone: [phone]   Fax:  [phone]  Physical Therapy Treatment  Patient Details  Name: [name] MRN: [id number] Date of Birth: [birth year] Referring Provider (PT): Dr Harless Nakayama   Encounter Date: 05/26/2021   PT End of Session - 05/26/21 1506     Visit Number 20    Number of Visits 24    Date for PT Re-Evaluation 06/11/21    Authorization Type BCBS    Progress Note Due on Visit 26    PT Start Time 1430    PT Stop Time 1508    PT Time Calculation (min) 38 min    Activity Tolerance Patient tolerated treatment well    Behavior During Therapy [facility] for tasks assessed/performed             Past Medical History:  Diagnosis Date   Diabetes mellitus without complication ([place])    Hypertension    Vitamin D deficiency 08/2017    Past Surgical History:  Procedure Laterality Date   FRACTURE SURGERY     I & D EXTREMITY Right 07/01/2013   Procedure: IRRIGATION AND DEBRIDEMENT RIGHT LEG;  Surgeon: Newt Minion, MD;  Location: [place];  Service: Orthopedics;  Laterality: Right;   ORIF FEMUR FRACTURE Right 07/01/2013   Procedure: OPEN REDUCTION INTERNAL FIXATION (ORIF) DISTAL FEMUR FRACTURE;  Surgeon: Newt Minion, MD;  Location: [place];  Service: Orthopedics;  Laterality: Right;    There were no vitals filed for this visit.   Subjective Assessment - 05/26/21 1433     Subjective doing well, "I always have pain somewhere."    Diagnostic tests xrays    Patient Stated Goals go up/down stairs like a regular person, not have as much pain in her back with daily actiivty    Currently in Pain? Yes    Pain Score 2     Pain Location Generalized    Pain Orientation Right    Pain Descriptors / Indicators Sore    Pain Type Chronic pain    Pain Onset More than a month ago    Pain Frequency Intermittent    Aggravating Factors  not stretching    Pain Relieving Factors stretching    Pain  Onset More than a month ago                               [facility] Adult PT Treatment/Exercise - 05/26/21 1433       Lumbar Exercises: Aerobic   Recumbent Bike L4X10 min      Lumbar Exercises: Machines for Strengthening   Leg Press 131#DL 3X10, 81# Rt LE only 3X10      Lumbar Exercises: Standing   Functional Squats 20 reps    Functional Squats Limitations TRX    Forward Lunge 10 reps   bil   Forward Lunge Limitations TRX    Other Standing Lumbar Exercises 6" step up 2x10 bil; intermittent UE support; heel tap off 4" step x 20 reps bil                         PT Long Term Goals - 05/07/21 [id number]       PT LONG TERM GOAL #1   Title I with advanced HEP    Status On-going      PT LONG TERM GOAL #2   Title improve FOTO =/>  50    Baseline now 55    Status On-going      PT LONG TERM GOAL #3   Title increase Rt hip strength =/> 5/5 to allow alternating steps on stairs    Baseline 4+    Status On-going      PT LONG TERM GOAL #4   Title demo good pelvic mobility to assist with stairs    Baseline still with difficulty with stairs    Status On-going      PT LONG TERM GOAL #5   Title report =/> 75% reduction of back pain with daily acitivty    Status Achieved      PT LONG TERM GOAL #6   Title report =/> 50% reduction in fear of falling    Baseline about the same fear of falling    Status On-going                   Plan - 05/26/21 1507     Clinical Impression Statement Pt tolerated session well today working on balance and strengthening of bil LEs.  Will continue to benefit from PT to maximize function.    Personal Factors and Comorbidities Age    Examination-Activity Limitations Squat;Stairs    Examination-Participation Restrictions Other    Stability/Clinical Decision Making Stable/Uncomplicated    Rehab Potential Good    PT Frequency 2x / week    PT Duration 4 weeks    PT Treatment/Interventions Stair  training;Taping;Patient/family education;Functional mobility training;Moist Heat;Traction;Passive range of motion;Therapeutic exercise;Cryotherapy;Electrical Stimulation;Neuromuscular re-education;Balance training;Manual techniques;Dry needling;Spinal Manipulations    PT Next Visit Plan focus on Rt hip/knee strength and stairs as tolerated, balance work as well    PT Home Exercise Plan [id number]    Consulted and Agree with Plan of Care Patient             Patient will benefit from skilled therapeutic intervention in order to improve the following deficits and impairments:  Decreased range of motion, Pain, Hypomobility, Postural dysfunction, Decreased strength  Visit Diagnosis: Muscle weakness (generalized)  Chronic midline low back pain without sciatica  Other abnormalities of gait and mobility     Problem List Patient Active Problem List   Diagnosis Date Noted   Hereditary hemochromatosis ([place]) 11/03/2017   Ankle pain, chronic, left 03/15/2012      Laureen Abrahams, PT, DPT 05/26/21 3:09 PM      [facility] [address] [place], [place], [address] Phone: [phone]   Fax:  [phone]  Name: [name] MRN: [id number] Date of Birth: [birth year]

## 2021-05-28 ENCOUNTER — Other Ambulatory Visit: Payer: Medicare Other

## 2021-05-28 ENCOUNTER — Ambulatory Visit (INDEPENDENT_AMBULATORY_CARE_PROVIDER_SITE_OTHER): Payer: Medicare Other | Admitting: Family Medicine

## 2021-05-28 ENCOUNTER — Encounter: Payer: Self-pay | Admitting: Family Medicine

## 2021-05-28 ENCOUNTER — Other Ambulatory Visit: Payer: Self-pay

## 2021-05-28 VITALS — BP 126/75 | HR 85 | Temp 98.0°F | Ht 61.0 in | Wt 156.0 lb

## 2021-05-28 DIAGNOSIS — N3941 Urge incontinence: Secondary | ICD-10-CM

## 2021-05-28 DIAGNOSIS — E1169 Type 2 diabetes mellitus with other specified complication: Secondary | ICD-10-CM | POA: Diagnosis not present

## 2021-05-28 DIAGNOSIS — I152 Hypertension secondary to endocrine disorders: Secondary | ICD-10-CM | POA: Insufficient documentation

## 2021-05-28 DIAGNOSIS — H269 Unspecified cataract: Secondary | ICD-10-CM | POA: Insufficient documentation

## 2021-05-28 DIAGNOSIS — E1159 Type 2 diabetes mellitus with other circulatory complications: Secondary | ICD-10-CM

## 2021-05-28 DIAGNOSIS — Z87891 Personal history of nicotine dependence: Secondary | ICD-10-CM | POA: Insufficient documentation

## 2021-05-28 DIAGNOSIS — F325 Major depressive disorder, single episode, in full remission: Secondary | ICD-10-CM

## 2021-05-28 DIAGNOSIS — J449 Chronic obstructive pulmonary disease, unspecified: Secondary | ICD-10-CM | POA: Diagnosis not present

## 2021-05-28 DIAGNOSIS — E785 Hyperlipidemia, unspecified: Secondary | ICD-10-CM

## 2021-05-28 DIAGNOSIS — F419 Anxiety disorder, unspecified: Secondary | ICD-10-CM | POA: Insufficient documentation

## 2021-05-28 DIAGNOSIS — E538 Deficiency of other specified B group vitamins: Secondary | ICD-10-CM

## 2021-05-28 DIAGNOSIS — E119 Type 2 diabetes mellitus without complications: Secondary | ICD-10-CM | POA: Insufficient documentation

## 2021-05-28 DIAGNOSIS — Z1211 Encounter for screening for malignant neoplasm of colon: Secondary | ICD-10-CM

## 2021-05-28 LAB — URINALYSIS, ROUTINE W REFLEX MICROSCOPIC
Bilirubin Urine: NEGATIVE
Hgb urine dipstick: NEGATIVE
Ketones, ur: NEGATIVE
Nitrite: NEGATIVE
RBC / HPF: NONE SEEN (ref 0–?)
Specific Gravity, Urine: 1.02 (ref 1.000–1.030)
Total Protein, Urine: NEGATIVE
Urine Glucose: NEGATIVE
Urobilinogen, UA: 0.2 (ref 0.0–1.0)
pH: 6 (ref 5.0–8.0)

## 2021-05-28 MED ORDER — SERTRALINE HCL 100 MG PO TABS: 100.0000 mg | ORAL_TABLET | Freq: Every day | ORAL | 3 refills | Status: DC

## 2021-05-28 MED ORDER — AMLODIPINE BESYLATE 10 MG PO TABS: 10.0000 mg | ORAL_TABLET | Freq: Every day | ORAL | 3 refills | Status: DC

## 2021-05-28 MED ORDER — LOSARTAN POTASSIUM 100 MG PO TABS: 100.0000 mg | ORAL_TABLET | Freq: Every day | ORAL | 3 refills | Status: DC

## 2021-05-28 MED ORDER — PRAVASTATIN SODIUM 40 MG PO TABS: 40.0000 mg | ORAL_TABLET | Freq: Every day | ORAL | 3 refills | Status: DC

## 2021-05-28 NOTE — Progress Notes (Signed)
[name] is a 70 y.o. female who presents today for an office visit.  Assessment/Plan:  Chronic Problems Addressed Today: B12 deficiency On B12 supplementation.  We can recheck next blood draw.  Anxiety Doing well on Zoloft 100 mg daily.  We will continue this.  Depression, major, single episode, complete remission ([place]) Doing well on Zoloft 100 mg daily.  We will continue this.  T2DM (type 2 diabetes mellitus) ([place]) Not currently on any medications.  She is working on lifestyle modifications.  Most recent A1c from about 6 months ago was 7.4.  She will continue working on lifestyle modifications and we can recheck in 3 to 6 months.  COPD (chronic obstructive pulmonary disease) (H[facility]) On albuterol and Trelegy.  She will be following up with pulmonology soon.  Has never had spirometry done.  Dyslipidemia associated with type 2 diabetes mellitus ([facility]) On pravastatin 40 mg daily.  We will check records from previous PCP.  We can check lipids next blood draw.  Former smoker He has yearly CT lung cancer screening.  Hypertension associated with diabetes ([place]) At goal on amlodipine 10 mg daily and losartan 100 mg daily.  Urge incontinence Check UA and urine culture to rule infection or other causes.  May start Myrbetriq or oxybutynin depending on the results.  Preventative health care We will order Cologuard.    Subjective:  HPI:  She is a new patient. Her previous PCP is retiring. She is here to discuss about COPD.   She is visiting her referred pulmonologist doctor in few weeks.She state that she have had cough that end 4 hours. She also complains of urine urgency that been going on for the past 6 months. No burning or lower abdominal pain.  In addition to this, she complains of spot in her legs. She never used any cream or moisturizer.   She have a past medical history of Hypertension, Vitamin D deficiency and Diabetes mellitus.    ROS: Per HPI, otherwise a  complete review of systems was negative.   PMH:  The following were reviewed and entered/updated in epic: Past Medical History:  Diagnosis Date   Diabetes mellitus without complication ([place])    Hypertension    Vitamin D deficiency 08/2017   Patient Active Problem List   Diagnosis Date Noted   Hypertension associated with diabetes ([place]) 05/28/2021   Former smoker 05/28/2021   Dyslipidemia associated with type 2 diabetes mellitus ([place]) 05/28/2021   COPD (chronic obstructive pulmonary disease) ([place]) 05/28/2021   Cataract 05/28/2021   T2DM (type 2 diabetes mellitus) ([place]) 05/28/2021   Depression, major, single episode, complete remission ([place]) 05/28/2021   Anxiety 05/28/2021   B12 deficiency 05/28/2021   Urge incontinence 05/28/2021   Hereditary hemochromatosis ([place]) 11/03/2017   Past Surgical History:  Procedure Laterality Date   FRACTURE SURGERY     I & D EXTREMITY Right 07/01/2013   Procedure: IRRIGATION AND DEBRIDEMENT RIGHT LEG;  Surgeon: Newt Minion, MD;  Location: [place];  Service: Orthopedics;  Laterality: Right;   LAPAROSCOPIC HYSTERECTOMY     ORIF FEMUR FRACTURE Right 07/01/2013   Procedure: OPEN REDUCTION INTERNAL FIXATION (ORIF) DISTAL FEMUR FRACTURE;  Surgeon: Newt Minion, MD;  Location: [place];  Service: Orthopedics;  Laterality: Right;    Family History  Problem Relation Age of Onset   Breast cancer Neg Hx     Medications- reviewed and updated Current Outpatient Medications  Medication Sig Dispense Refill   albuterol (VENTOLIN HFA) 108 (90 Base)  MCG/ACT inhaler 2 puffs as needed     cholecalciferol (VITAMIN D) 1000 units tablet Take 5,000 Units by mouth daily.     Cyanocobalamin (VITAMIN B 12) 500 MCG TABS 1 tablet     Homeopathic Products (LIVER SUPPORT) SUBL Place under the tongue.     TRELEGY ELLIPTA 100-62.5-25 MCG/INH AEPB      amLODipine (NORVASC) 10 MG tablet Take 1 tablet (10 mg total) by mouth daily. 90 tablet 3   losartan (COZAAR) 100 MG tablet  Take 1 tablet (100 mg total) by mouth daily. 90 tablet 3   pravastatin (PRAVACHOL) 40 MG tablet Take 1 tablet (40 mg total) by mouth daily. 90 tablet 3   sertraline (ZOLOFT) 100 MG tablet Take 1 tablet (100 mg total) by mouth daily. 90 tablet 3   No current facility-administered medications for this visit.    Allergies-reviewed and updated No Known Allergies  Social History   Socioeconomic History   Marital status: Married    Spouse name: Not on file   Number of children: Not on file   Years of education: Not on file   Highest education level: Not on file  Occupational History   Not on file  Tobacco Use   Smoking status: Former    Packs/day: 1.00    Years: 44.00    Pack years: 44.00    Types: Cigarettes   Smokeless tobacco: Never  Substance and Sexual Activity   Alcohol use: Yes   Drug use: Never   Sexual activity: Not on file  Other Topics Concern   Not on file  Social History Narrative   Not on file   Social Determinants of Health   Financial Resource Strain: Not on file  Food Insecurity: Not on file  Transportation Needs: Not on file  Physical Activity: Not on file  Stress: Not on file  Social Connections: Not on file         Objective:  Physical Exam: BP 126/75   Pulse 85   Temp 98 F (36.7 C) (Temporal)   Ht '5\' 1"'$  (1.549 m)   Wt 156 lb (70.8 kg)   SpO2 95%   BMI 29.48 kg/m   Gen: No acute distress, resting comfortably CV: Regular rate and rhythm with no murmurs appreciated Pulm: Normal work of breathing, clear to auscultation bilaterally with no crackles, wheezes, or rhonchi Neuro: Grossly normal, moves all extremities Psych: Normal affect and thought content       I,Savera Zaman,acting as a scribe for Dimas Chyle, MD.,have documented all relevant documentation on the behalf of Dimas Chyle, MD,as directed by  Dimas Chyle, MD while in the presence of Dimas Chyle, MD.  I, Dimas Chyle, MD, have reviewed all documentation for this visit. The  documentation on 05/28/21 for the exam, diagnosis, procedures, and orders are all accurate and complete.  Time Spent: 65 minutes of total time was spent on the date of the encounter performing the following actions: chart review prior to seeing the patient including recent visits with specialists, obtaining history, performing a medically necessary exam, counseling on the treatment plan, placing orders, and documenting in our EHR.   Algis Greenhouse. Jerline Pain, MD 05/28/2021 11:39 AM

## 2021-05-28 NOTE — Patient Instructions (Signed)
It was very nice to see you today!  We will check a urine sample.  We may start a bladder medication such as Myrbetriq depending on results.  Please continue working on diet and exercise.  I will refill your medications today.  Will place order for Cologuard.  I will see back in year.  Come back to see me sooner if needed.  Take care, Dr Jerline Pain  PLEASE NOTE:  If you had any lab tests please let us know if you have not heard back within a few days. You may see your results on mychart before we have a chance to review them but we will give you a call once they are reviewed by [place]. If we ordered any referrals today, please let us know if you have not heard from their office within the next week.   Please try these tips to maintain a healthy lifestyle:  Eat at least 3 REAL meals and 1-2 snacks per day.  Aim for no more than 5 hours between eating.  If you eat breakfast, please do so within one hour of getting up.   Each meal should contain half fruits/vegetables, one quarter protein, and one quarter carbs (no bigger than a computer mouse)  Cut down on sweet beverages. This includes juice, soda, and sweet tea.   Drink at least 1 glass of water with each meal and aim for at least 8 glasses per day  Exercise at least 150 minutes every week.

## 2021-05-28 NOTE — Assessment & Plan Note (Signed)
Check UA and urine culture to rule infection or other causes.  May start Myrbetriq or oxybutynin depending on the results.

## 2021-05-28 NOTE — Assessment & Plan Note (Signed)
Doing well on Zoloft 100 mg daily.  We will continue this.

## 2021-05-28 NOTE — Assessment & Plan Note (Signed)
On B12 supplementation.  We can recheck next blood draw.

## 2021-05-28 NOTE — Assessment & Plan Note (Signed)
He has yearly CT lung cancer screening.

## 2021-05-28 NOTE — Assessment & Plan Note (Signed)
At goal on amlodipine 10 mg daily and losartan 100 mg daily.

## 2021-05-28 NOTE — Assessment & Plan Note (Signed)
Not currently on any medications.  She is working on lifestyle modifications.  Most recent A1c from about 6 months ago was 7.4.  She will continue working on lifestyle modifications and we can recheck in 3 to 6 months.

## 2021-05-28 NOTE — Assessment & Plan Note (Signed)
On pravastatin 40 mg daily.  We will check records from previous PCP.  We can check lipids next blood draw.

## 2021-05-28 NOTE — Assessment & Plan Note (Signed)
On albuterol and Trelegy.  She will be following up with pulmonology soon.  Has never had spirometry done.

## 2021-05-29 ENCOUNTER — Encounter: Payer: Medicare Other | Admitting: Physical Therapy

## 2021-05-29 ENCOUNTER — Telehealth: Payer: Medicare Other | Admitting: Hematology and Oncology

## 2021-05-29 LAB — URINE CULTURE
MICRO NUMBER:: 12225162
SPECIMEN QUALITY:: ADEQUATE

## 2021-06-02 ENCOUNTER — Encounter: Payer: Medicare Other | Admitting: Physical Therapy

## 2021-06-02 ENCOUNTER — Other Ambulatory Visit: Payer: Self-pay

## 2021-06-02 MED ORDER — MIRABEGRON ER 25 MG PO TB24: 25.0000 mg | ORAL_TABLET | Freq: Every day | ORAL | 5 refills | Status: DC

## 2021-06-02 NOTE — Progress Notes (Signed)
Please inform patient of the following:  Urine culture negative with no signs of infection. We can try starting myrbetriq '25mg'$  once daily to help with overactive bladder if she wishes.  Algis Greenhouse. Jerline Pain, MD 06/02/2021 1:07 PM

## 2021-06-05 ENCOUNTER — Other Ambulatory Visit: Payer: Self-pay

## 2021-06-05 ENCOUNTER — Ambulatory Visit (INDEPENDENT_AMBULATORY_CARE_PROVIDER_SITE_OTHER): Payer: Medicare Other | Admitting: Physical Therapy

## 2021-06-05 DIAGNOSIS — G8929 Other chronic pain: Secondary | ICD-10-CM | POA: Diagnosis not present

## 2021-06-05 DIAGNOSIS — R2689 Other abnormalities of gait and mobility: Secondary | ICD-10-CM

## 2021-06-05 DIAGNOSIS — M6281 Muscle weakness (generalized): Secondary | ICD-10-CM | POA: Diagnosis not present

## 2021-06-05 DIAGNOSIS — M545 Low back pain, unspecified: Secondary | ICD-10-CM

## 2021-06-05 NOTE — Therapy (Signed)
[facility] [address] [place], [place], [address] Phone: [phone]   Fax:  [phone]  Physical Therapy Treatment  Patient Details  Name: [name] MRN: [id number] Date of Birth: [birth year] Referring Provider (PT): Dr Harless Nakayama   Encounter Date: 06/05/2021   PT End of Session - 06/05/21 1012     Visit Number 21    Number of Visits 24    Date for PT Re-Evaluation 06/11/21    Authorization Type BCBS    Progress Note Due on Visit 26    PT Start Time 0930    PT Stop Time 1015    PT Time Calculation (min) 45 min    Activity Tolerance Patient tolerated treatment well    Behavior During Therapy [facility] for tasks assessed/performed             Past Medical History:  Diagnosis Date   Diabetes mellitus without complication ([place])    Hypertension    Vitamin D deficiency 08/2017    Past Surgical History:  Procedure Laterality Date   FRACTURE SURGERY     I & D EXTREMITY Right 07/01/2013   Procedure: IRRIGATION AND DEBRIDEMENT RIGHT LEG;  Surgeon: Newt Minion, MD;  Location: [place];  Service: Orthopedics;  Laterality: Right;   LAPAROSCOPIC HYSTERECTOMY     ORIF FEMUR FRACTURE Right 07/01/2013   Procedure: OPEN REDUCTION INTERNAL FIXATION (ORIF) DISTAL FEMUR FRACTURE;  Surgeon: Newt Minion, MD;  Location: [place];  Service: Orthopedics;  Laterality: Right;    There were no vitals filed for this visit.   Subjective Assessment - 06/05/21 1011     Subjective she relays she had stomach bug earlier this week so missed her last appointment, she feels much better now. She relays she has joined the [employer] and wants to know what weights she should use on the machines.    Diagnostic tests xrays    Patient Stated Goals go up/down stairs like a regular person, not have as much pain in her back with daily actiivty    Pain Onset More than a month ago    Pain Onset More than a month ago              [facility] Adult PT Treatment/Exercise -  06/05/21 0001       Lumbar Exercises: Machines for Strengthening   Cybex Knee Extension 20# 3X10 DL, then Rt leg 5# 3X10    Cybex Knee Flexion 35#3X10    Leg Press 131#DL 3X10, 81# Rt LE only 3X10      Lumbar Exercises: Standing   Functional Squats 20 reps    Functional Squats Limitations TRX    Forward Lunge 10 reps    Forward Lunge Limitations TRX    Other Standing Lumbar Exercises 8 inch step up with Rt leg and down on other side with left leg, X 10, then lateral step ups on Rt  8 inch X 10    Other Standing Lumbar Exercises heel and toe raises holding 2 sec X 20                         PT Long Term Goals - 05/07/21 [id number]       PT LONG TERM GOAL #1   Title I with advanced HEP    Status On-going      PT LONG TERM GOAL #2   Title improve FOTO =/> 67    Baseline now 55  Status On-going      PT LONG TERM GOAL #3   Title increase Rt hip strength =/> 5/5 to allow alternating steps on stairs    Baseline 4+    Status On-going      PT LONG TERM GOAL #4   Title demo good pelvic mobility to assist with stairs    Baseline still with difficulty with stairs    Status On-going      PT LONG TERM GOAL #5   Title report =/> 75% reduction of back pain with daily acitivty    Status Achieved      PT LONG TERM GOAL #6   Title report =/> 50% reduction in fear of falling    Baseline about the same fear of falling    Status On-going                   Plan - 06/05/21 1020     Clinical Impression Statement She has joined the gym which will greatly help her overall progress. I printed out for her what weight machines to use and what weights to try to start with based on the resisance she is using at our clinic but also informed her the resistance may be very different on other machines but in general she should [place] a resistance she can perform 10-20 reps 2-3 sets.    Personal Factors and Comorbidities Age    Examination-Activity Limitations Squat;Stairs     Examination-Participation Restrictions Other    Stability/Clinical Decision Making Stable/Uncomplicated    Rehab Potential Good    PT Frequency 2x / week    PT Duration 4 weeks    PT Treatment/Interventions Stair training;Taping;Patient/family education;Functional mobility training;Moist Heat;Traction;Passive range of motion;Therapeutic exercise;Cryotherapy;Electrical Stimulation;Neuromuscular re-education;Balance training;Manual techniques;Dry needling;Spinal Manipulations    PT Next Visit Plan focus on Rt hip/knee strength and stairs as tolerated, balance work as well    PT Home Exercise Plan [id number]    Consulted and Agree with Plan of Care Patient             Patient will benefit from skilled therapeutic intervention in order to improve the following deficits and impairments:  Decreased range of motion, Pain, Hypomobility, Postural dysfunction, Decreased strength  Visit Diagnosis: Muscle weakness (generalized)  Chronic midline low back pain without sciatica  Other abnormalities of gait and mobility     Problem List Patient Active Problem List   Diagnosis Date Noted   Hypertension associated with diabetes ([place]) 05/28/2021   Former smoker 05/28/2021   Dyslipidemia associated with type 2 diabetes mellitus ([place]) 05/28/2021   COPD (chronic obstructive pulmonary disease) ([place]) 05/28/2021   Cataract 05/28/2021   T2DM (type 2 diabetes mellitus) ([place]) 05/28/2021   Depression, major, single episode, complete remission ([place]) 05/28/2021   Anxiety 05/28/2021   B12 deficiency 05/28/2021   Urge incontinence 05/28/2021   Hereditary hemochromatosis ([place]) 11/03/2017    [name] 06/05/2021, 10:22 AM  [facility] [address] [place], [place], [address] Phone: [phone]   Fax:  [phone]  Name: [name] MRN: [id number] Date of Birth: Nov 25, 1950

## 2021-06-09 ENCOUNTER — Ambulatory Visit: Payer: Medicare Other | Admitting: Physical Therapy

## 2021-06-09 ENCOUNTER — Other Ambulatory Visit: Payer: Self-pay

## 2021-06-09 DIAGNOSIS — M6281 Muscle weakness (generalized): Secondary | ICD-10-CM

## 2021-06-09 DIAGNOSIS — M545 Low back pain, unspecified: Secondary | ICD-10-CM

## 2021-06-09 DIAGNOSIS — G8929 Other chronic pain: Secondary | ICD-10-CM | POA: Diagnosis not present

## 2021-06-09 DIAGNOSIS — R2689 Other abnormalities of gait and mobility: Secondary | ICD-10-CM | POA: Diagnosis not present

## 2021-06-09 DIAGNOSIS — Z1211 Encounter for screening for malignant neoplasm of colon: Secondary | ICD-10-CM | POA: Diagnosis not present

## 2021-06-09 NOTE — Therapy (Signed)
[facility] [address] [place], [place], [address] Phone: [phone]   Fax:  [phone]  Physical Therapy Treatment  Patient Details  Name: [name] MRN: [id number] Date of Birth: 06/24/1951 Referring Provider (PT): Dr Harless Nakayama   Encounter Date: 06/09/2021   PT End of Session - 06/09/21 1013     Visit Number 22    Number of Visits 24    Date for PT Re-Evaluation 06/11/21    Authorization Type BCBS    Progress Note Due on Visit 26    PT Start Time 0931    PT Stop Time [id number]    PT Time Calculation (min) 43 min    Activity Tolerance Patient tolerated treatment well    Behavior During Therapy [facility] for tasks assessed/performed             Past Medical History:  Diagnosis Date   Diabetes mellitus without complication ([place])    Hypertension    Vitamin D deficiency 08/2017    Past Surgical History:  Procedure Laterality Date   FRACTURE SURGERY     I & D EXTREMITY Right 07/01/2013   Procedure: IRRIGATION AND DEBRIDEMENT RIGHT LEG;  Surgeon: Newt Minion, MD;  Location: [place];  Service: Orthopedics;  Laterality: Right;   LAPAROSCOPIC HYSTERECTOMY     ORIF FEMUR FRACTURE Right 07/01/2013   Procedure: OPEN REDUCTION INTERNAL FIXATION (ORIF) DISTAL FEMUR FRACTURE;  Surgeon: Newt Minion, MD;  Location: [place];  Service: Orthopedics;  Laterality: Right;    There were no vitals filed for this visit.   Subjective Assessment - 06/09/21 0959     Subjective she relays about 6/10 pain in her Rt knee today.    Diagnostic tests xrays    Patient Stated Goals go up/down stairs like a regular person, not have as much pain in her back with daily actiivty    Pain Onset More than a month ago    Pain Onset More than a month ago                [facility] PT Assessment - 06/09/21 0001       Strength   Overall Strength Comments 5- Rt leg strength, 5 Lt leg strength grossly                           [facility] Adult PT  Treatment/Exercise - 06/09/21 0001       Lumbar Exercises: Aerobic   Nustep L6 X 10 min      Lumbar Exercises: Machines for Strengthening   Cybex Knee Extension 20# 3X10 DL, then Rt leg 5# 2X10    Cybex Knee Flexion 35#3X10    Leg Press 131#DL 3X10, 75# Rt leg only 3X10      Lumbar Exercises: Standing   Functional Squats 20 reps    Functional Squats Limitations TRX    Forward Lunge 10 reps    Forward Lunge Limitations TRX    Other Standing Lumbar Exercises 6 inch step up with Rt leg and down on other side with left leg, X 10, then lateral step ups on Rt  6 inch X 10    Other Standing Lumbar Exercises heel and toe raises holding 2 sec X 20, SLS 15 sec X 5 on Rt                         PT Long Term Goals - 05/07/21 [id number]  PT LONG TERM GOAL #1   Title I with advanced HEP    Status On-going      PT LONG TERM GOAL #2   Title improve FOTO =/> 67    Baseline now 55    Status On-going      PT LONG TERM GOAL #3   Title increase Rt hip strength =/> 5/5 to allow alternating steps on stairs    Baseline 4+    Status On-going      PT LONG TERM GOAL #4   Title demo good pelvic mobility to assist with stairs    Baseline still with difficulty with stairs    Status On-going      PT LONG TERM GOAL #5   Title report =/> 75% reduction of back pain with daily acitivty    Status Achieved      PT LONG TERM GOAL #6   Title report =/> 50% reduction in fear of falling    Baseline about the same fear of falling    Status On-going                   Plan - 06/09/21 1014     Clinical Impression Statement She had more Rt knee pain today so we backed down on her resistance and or reps today as tolerated. She has 2 more visits scheduled then we plan to discharge to HEP and she will also workout at the gym.    Personal Factors and Comorbidities Age    Examination-Activity Limitations Squat;Stairs    Examination-Participation Restrictions Other     Stability/Clinical Decision Making Stable/Uncomplicated    Rehab Potential Good    PT Frequency 2x / week    PT Duration 4 weeks    PT Treatment/Interventions Stair training;Taping;Patient/family education;Functional mobility training;Moist Heat;Traction;Passive range of motion;Therapeutic exercise;Cryotherapy;Electrical Stimulation;Neuromuscular re-education;Balance training;Manual techniques;Dry needling;Spinal Manipulations    PT Next Visit Plan focus on Rt hip/knee strength and stairs as tolerated, balance work as well    PT Home Exercise Plan [id number]    Consulted and Agree with Plan of Care Patient             Patient will benefit from skilled therapeutic intervention in order to improve the following deficits and impairments:  Decreased range of motion, Pain, Hypomobility, Postural dysfunction, Decreased strength  Visit Diagnosis: Muscle weakness (generalized)  Chronic midline low back pain without sciatica  Other abnormalities of gait and mobility     Problem List Patient Active Problem List   Diagnosis Date Noted   Hypertension associated with diabetes ([place]) 05/28/2021   Former smoker 05/28/2021   Dyslipidemia associated with type 2 diabetes mellitus ([place]) 05/28/2021   COPD (chronic obstructive pulmonary disease) ([place]) 05/28/2021   Cataract 05/28/2021   T2DM (type 2 diabetes mellitus) ([place]) 05/28/2021   Depression, major, single episode, complete remission ([place]) 05/28/2021   Anxiety 05/28/2021   B12 deficiency 05/28/2021   Urge incontinence 05/28/2021   Hereditary hemochromatosis ([place]) 11/03/2017    [name] 06/09/2021, 10:16 AM  [facility] [address] [place], [place], [address] Phone: [phone]   Fax:  [phone]  Name: [name] MRN: [id number] Date of Birth: 07/20/1951

## 2021-06-10 ENCOUNTER — Other Ambulatory Visit: Payer: Self-pay

## 2021-06-10 ENCOUNTER — Other Ambulatory Visit: Payer: Self-pay | Admitting: *Deleted

## 2021-06-11 ENCOUNTER — Inpatient Hospital Stay: Payer: Medicare Other

## 2021-06-11 ENCOUNTER — Inpatient Hospital Stay: Payer: Medicare Other | Attending: Hematology and Oncology

## 2021-06-11 ENCOUNTER — Other Ambulatory Visit: Payer: Self-pay

## 2021-06-11 LAB — CBC WITH DIFFERENTIAL (CANCER CENTER ONLY)
Abs Immature Granulocytes: 0.01 10*3/uL (ref 0.00–0.07)
Basophils Absolute: 0 10*3/uL (ref 0.0–0.1)
Basophils Relative: 1 %
Eosinophils Absolute: 0.5 10*3/uL (ref 0.0–0.5)
Eosinophils Relative: 10 %
HCT: 39.6 % (ref 36.0–46.0)
Hemoglobin: 13.6 g/dL (ref 12.0–15.0)
Immature Granulocytes: 0 %
Lymphocytes Relative: 17 %
Lymphs Abs: 0.8 10*3/uL (ref 0.7–4.0)
MCH: 31 pg (ref 26.0–34.0)
MCHC: 34.3 g/dL (ref 30.0–36.0)
MCV: 90.2 fL (ref 80.0–100.0)
Monocytes Absolute: 0.3 10*3/uL (ref 0.1–1.0)
Monocytes Relative: 6 %
Neutro Abs: 3.1 10*3/uL (ref 1.7–7.7)
Neutrophils Relative %: 66 %
Platelet Count: 122 10*3/uL — ABNORMAL LOW (ref 150–400)
RBC: 4.39 MIL/uL (ref 3.87–5.11)
RDW: 13.2 % (ref 11.5–15.5)
WBC Count: 4.8 10*3/uL (ref 4.0–10.5)
nRBC: 0 % (ref 0.0–0.2)

## 2021-06-11 LAB — CMP (CANCER CENTER ONLY)
ALT: 60 U/L — ABNORMAL HIGH (ref 0–44)
AST: 56 U/L — ABNORMAL HIGH (ref 15–41)
Albumin: 4.3 g/dL (ref 3.5–5.0)
Alkaline Phosphatase: 95 U/L (ref 38–126)
Anion gap: 10 (ref 5–15)
BUN: 14 mg/dL (ref 8–23)
CO2: 26 mmol/L (ref 22–32)
Calcium: 9.6 mg/dL (ref 8.9–10.3)
Chloride: 105 mmol/L (ref 98–111)
Creatinine: 0.73 mg/dL (ref 0.44–1.00)
GFR, Estimated: >60 mL/min (ref >60)
Glucose, Bld: 201 mg/dL — ABNORMAL HIGH (ref 70–99)
Potassium: 4.5 mmol/L (ref 3.5–5.1)
Sodium: 141 mmol/L (ref 135–145)
Total Bilirubin: 0.9 mg/dL (ref 0.3–1.2)
Total Protein: 6.7 g/dL (ref 6.5–8.1)

## 2021-06-11 LAB — IRON AND TIBC
Iron: 128 ug/dL (ref 41–142)
Saturation Ratios: 32 % (ref 21–57)
TIBC: 399 ug/dL (ref 236–444)
UIBC: 271 ug/dL (ref 120–384)

## 2021-06-11 LAB — HEMOGLOBIN A1C
Hgb A1c MFr Bld: 7.3 % — ABNORMAL HIGH (ref 4.8–5.6)
Mean Plasma Glucose: 162.81 mg/dL

## 2021-06-11 LAB — FERRITIN: Ferritin: 20 ng/mL (ref 11–307)

## 2021-06-11 NOTE — Assessment & Plan Note (Signed)
Elevated liver function tests with elevated ferritin 08/2017 Ferritin : 918.9, iron saturation 43% 01/17/2018 ferritin 124, iron saturation 22% 05/30/2018: Ferritin 108, iron saturation 34% 09/21/2018: Ferritin 24, iron saturation 24% 04/05/2019:Ferritin 180, iron saturation 38% 06/29/2019: Ferritin 169, iron saturation 31% 10/02/2019: Ferritin 102, iron saturation 28% 02/05/2020: Ferritin 71, iron saturation 20% 07/23/2020: Ferritin 41, iron saturation 13% 01/23/2021: Ferritin 105, iron saturation 33%, AST 102, ALT 126, alkaline phosphatase 165, bilirubin 0.8 03/05/21: Ferritin 51, Iron sat 26%, Platelets 100, AST 82, ALT 76, Alk Phos 151 04/15/2021: Ferritin 43, iron saturation 13%, hemoglobin 15.3, platelets 118 AST 76, ALT 93, hemoglobin A1c 7.4  Phlebotomyschedule: Will be changed to every 8 weeks Diabetes: Her A1c has improved to 7.4 (from 8.2)  Fracture right inferior pubic ramus: Limiting her activities. Severe weight gain  We will be switching her phlebotomy schedule to every 8 weeks.

## 2021-06-11 NOTE — Progress Notes (Signed)
Patient Care Team: Vivi Barrack, MD as PCP - General (Family Medicine) Lavonna Monarch, MD as Consulting Physician (Dermatology)  DIAGNOSIS:    ICD-10-CM   1. Hereditary hemochromatosis ([place])  E83.110       CHIEF COMPLIANT: Follow-up of hereditary hemochromatosis  INTERVAL HISTORY: [name] is a 70 y.o. with above-mentioned history of  hereditary hemochromatosis on phlebotomies every 6 weeks. Labs on 08/24 showed : WBC 4.8, Hg 13.6, HCT 39.6,  platelets 122, creatinine 0.73, AST 56, ALT 60, iron saturation 32%, ferritin 20. The patient presents over MyChart video today for follow-up.   ALLERGIES:  has No Known Allergies.  MEDICATIONS:  Current Outpatient Medications  Medication Sig Dispense Refill   albuterol (VENTOLIN HFA) 108 (90 Base) MCG/ACT inhaler 2 puffs as needed     amLODipine (NORVASC) 10 MG tablet Take 1 tablet (10 mg total) by mouth daily. 90 tablet 3   cholecalciferol (VITAMIN D) 1000 units tablet Take 5,000 Units by mouth daily.     Cyanocobalamin (VITAMIN B 12) 500 MCG TABS 1 tablet     Homeopathic Products (LIVER SUPPORT) SUBL Place under the tongue.     losartan (COZAAR) 100 MG tablet Take 1 tablet (100 mg total) by mouth daily. 90 tablet 3   mirabegron ER (MYRBETRIQ) 25 MG TB24 tablet Take 1 tablet (25 mg total) by mouth daily. 30 tablet 5   pravastatin (PRAVACHOL) 40 MG tablet Take 1 tablet (40 mg total) by mouth daily. 90 tablet 3   sertraline (ZOLOFT) 100 MG tablet Take 1 tablet (100 mg total) by mouth daily. 90 tablet 3   TRELEGY ELLIPTA 100-62.5-25 MCG/INH AEPB      No current facility-administered medications for this visit.    PHYSICAL EXAMINATION: ECOG PERFORMANCE STATUS: 1 - Symptomatic but completely ambulatory  There were no vitals filed for this visit. There were no vitals filed for this visit.   LABORATORY DATA:  I have reviewed the data as listed CMP Latest Ref Rng & Units 06/11/2021 04/16/2021 03/05/2021  Glucose 70 - 99 mg/dL  201(H) 199(H) 322(H)  BUN 8 - 23 mg/dL '14 23 15  '$ Creatinine 0.44 - 1.00 mg/dL 0.73 0.74 0.79  Sodium 135 - 145 mmol/L 141 141 141  Potassium 3.5 - 5.1 mmol/L 4.5 4.0 4.3  Chloride 98 - 111 mmol/L 105 101 100  CO2 22 - 32 mmol/L '26 26 28  '$ Calcium 8.9 - 10.3 mg/dL 9.6 9.4 10.1  Total Protein 6.5 - 8.1 g/dL 6.7 7.0 6.8  Total Bilirubin 0.3 - 1.2 mg/dL 0.9 0.9 0.7  Alkaline Phos 38 - 126 U/L 95 113 151(H)  AST 15 - 41 U/L 56(H) 76(H) 82(H)  ALT 0 - 44 U/L 60(H) 93(H) 76(H)    Lab Results  Component Value Date   WBC 4.8 06/11/2021   HGB 13.6 06/11/2021   HCT 39.6 06/11/2021   MCV 90.2 06/11/2021   PLT 122 (L) 06/11/2021   NEUTROABS 3.1 06/11/2021    ASSESSMENT & PLAN:  Hereditary hemochromatosis ([place]) Elevated liver function tests with elevated ferritin 08/2017 Ferritin : 918.9, iron saturation 43% 01/17/2018 ferritin 124, iron saturation 22% 04/05/2019: Ferritin 180, iron saturation 38% 02/05/2020: Ferritin 71, iron saturation 20% 01/23/2021: Ferritin 105, iron saturation 33%, AST 102, ALT 126, alkaline phosphatase 165, bilirubin 0.8 04/15/2021: Ferritin 43, iron saturation 13%, hemoglobin 15.3, platelets 118 AST 76, ALT 93, hemoglobin A1c 7.4 06/11/2021: Ferritin 20, iron saturation 32%, hemoglobin 13.6, platelets 122, AST 56, ALT 60   Diabetes:  Her A1c has improved to 7.3 (from 8.2)  Fracture right inferior pubic ramus: Limiting her activities. Weight issues: Patient has noticed a decrease in her weight.   We will be switching her phlebotomy schedule to every 12 weeks.    No orders of the defined types were placed in this encounter.  The patient has a good understanding of the overall plan. she agrees with it. she will call with any problems that may develop before the next visit here.  Total time spent: 20 mins including face to face time and time spent for planning, charting and coordination of care  Rulon Eisenmenger, MD, MPH 06/12/2021  I, Thana Ates, am acting as  scribe for Dr. Nicholas Lose.  I have reviewed the above documentation for accuracy and completeness, and I agree with the above.

## 2021-06-11 NOTE — Progress Notes (Unsigned)
RN reviewed parameters for phlebotomy on 8/24 with MD.  Per MD - phlebotomies Q 8 weeks due to increase in LFT's

## 2021-06-11 NOTE — Progress Notes (Signed)
Patient was seen today for a therapeutic phlebotomy. Offered patient liquids and snack. A 16G phlebotomy kit was used Procedure started at 13:28 and ended at 13:33. 530 grams were obtained.  Needle removed intact.  Patient observed for 15 minutes post procedure. VSS at discharge.

## 2021-06-12 ENCOUNTER — Encounter: Payer: Self-pay | Admitting: Pulmonary Disease

## 2021-06-12 ENCOUNTER — Ambulatory Visit (INDEPENDENT_AMBULATORY_CARE_PROVIDER_SITE_OTHER): Payer: Medicare Other | Admitting: Physical Therapy

## 2021-06-12 ENCOUNTER — Ambulatory Visit: Payer: Medicare Other | Admitting: Pulmonary Disease

## 2021-06-12 ENCOUNTER — Inpatient Hospital Stay (HOSPITAL_BASED_OUTPATIENT_CLINIC_OR_DEPARTMENT_OTHER): Payer: Medicare Other | Admitting: Hematology and Oncology

## 2021-06-12 VITALS — BP 116/74 | HR 85 | Ht 61.0 in | Wt 156.8 lb

## 2021-06-12 DIAGNOSIS — R2689 Other abnormalities of gait and mobility: Secondary | ICD-10-CM

## 2021-06-12 DIAGNOSIS — M6281 Muscle weakness (generalized): Secondary | ICD-10-CM | POA: Diagnosis not present

## 2021-06-12 DIAGNOSIS — G8929 Other chronic pain: Secondary | ICD-10-CM | POA: Diagnosis not present

## 2021-06-12 DIAGNOSIS — M545 Low back pain, unspecified: Secondary | ICD-10-CM | POA: Diagnosis not present

## 2021-06-12 DIAGNOSIS — Z87891 Personal history of nicotine dependence: Secondary | ICD-10-CM | POA: Diagnosis not present

## 2021-06-12 DIAGNOSIS — R0600 Dyspnea, unspecified: Secondary | ICD-10-CM

## 2021-06-12 DIAGNOSIS — R0609 Other forms of dyspnea: Secondary | ICD-10-CM

## 2021-06-12 NOTE — Therapy (Signed)
[facility] [address] [place], [place], [address] Phone: [phone]   Fax:  [phone]  Physical Therapy Treatment  Patient Details  Name: [name] MRN: [id number] Date of Birth: 06/02/1951 Referring Provider (PT): Dr Harless Nakayama   Encounter Date: 06/12/2021   PT End of Session - 06/12/21 1005     Visit Number 23    Number of Visits 24    Authorization Type BCBS    Progress Note Due on Visit 26    PT Start Time 0931    PT Stop Time 1015    PT Time Calculation (min) 44 min    Activity Tolerance Patient tolerated treatment well    Behavior During Therapy [facility] for tasks assessed/performed             Past Medical History:  Diagnosis Date   Diabetes mellitus without complication ([place])    Hypertension    Vitamin D deficiency 08/2017    Past Surgical History:  Procedure Laterality Date   FRACTURE SURGERY     I & D EXTREMITY Right 07/01/2013   Procedure: IRRIGATION AND DEBRIDEMENT RIGHT LEG;  Surgeon: Newt Minion, MD;  Location: [place];  Service: Orthopedics;  Laterality: Right;   LAPAROSCOPIC HYSTERECTOMY     ORIF FEMUR FRACTURE Right 07/01/2013   Procedure: OPEN REDUCTION INTERNAL FIXATION (ORIF) DISTAL FEMUR FRACTURE;  Surgeon: Newt Minion, MD;  Location: [place];  Service: Orthopedics;  Laterality: Right;    There were no vitals filed for this visit.   Subjective Assessment - 06/12/21 1004     Subjective relays she is feeling good today, no pain upon arrival and is able to squat now without pain or difficulty    Diagnostic tests xrays    Patient Stated Goals go up/down stairs like a regular person, not have as much pain in her back with daily actiivty    Pain Onset More than a month ago    Pain Onset More than a month ago                               [facility] Adult PT Treatment/Exercise - 06/12/21 0001       Lumbar Exercises: Machines for Strengthening   Cybex Knee Extension 20# 3X10 DL,  then Rt leg 5# 2X10    Cybex Knee Flexion 35#3X10    Leg Press 143#DL 3X10, 75# Rt leg only 3X10      Lumbar Exercises: Standing   Functional Squats 20 reps    Functional Squats Limitations TRX    Forward Lunge 10 reps    Forward Lunge Limitations TRX    Other Standing Lumbar Exercises 8 inch step up with Rt leg and down on other side with left leg, X 20    Other Standing Lumbar Exercises SLS 4X15 sec bilat                         PT Long Term Goals - 05/07/21 0958       PT LONG TERM GOAL #1   Title I with advanced HEP    Status On-going      PT LONG TERM GOAL #2   Title improve FOTO =/> 67    Baseline now 55    Status On-going      PT LONG TERM GOAL #3   Title increase Rt hip strength =/> 5/5 to allow alternating steps  on stairs    Baseline 4+    Status On-going      PT LONG TERM GOAL #4   Title demo good pelvic mobility to assist with stairs    Baseline still with difficulty with stairs    Status On-going      PT LONG TERM GOAL #5   Title report =/> 75% reduction of back pain with daily acitivty    Status Achieved      PT LONG TERM GOAL #6   Title report =/> 50% reduction in fear of falling    Baseline about the same fear of falling    Status On-going                   Plan - 06/12/21 1027     Clinical Impression Statement She is overall doing well, she has one more PT visit and we will plan to discharge her to independent program with HEP and gym activities.    Personal Factors and Comorbidities Age    Examination-Activity Limitations Squat;Stairs    Examination-Participation Restrictions Other    Stability/Clinical Decision Making Stable/Uncomplicated    Rehab Potential Good    PT Frequency 2x / week    PT Duration 4 weeks    PT Treatment/Interventions Stair training;Taping;Patient/family education;Functional mobility training;Moist Heat;Traction;Passive range of motion;Therapeutic exercise;Cryotherapy;Electrical  Stimulation;Neuromuscular re-education;Balance training;Manual techniques;Dry needling;Spinal Manipulations    PT Next Visit Plan DC next visit, update measurements and goals, FOTO    PT Home Exercise Plan [id number]    Consulted and Agree with Plan of Care Patient             Patient will benefit from skilled therapeutic intervention in order to improve the following deficits and impairments:  Decreased range of motion, Pain, Hypomobility, Postural dysfunction, Decreased strength  Visit Diagnosis: Muscle weakness (generalized)  Chronic midline low back pain without sciatica  Other abnormalities of gait and mobility     Problem List Patient Active Problem List   Diagnosis Date Noted   Hypertension associated with diabetes ([place]) 05/28/2021   Former smoker 05/28/2021   Dyslipidemia associated with type 2 diabetes mellitus ([place]) 05/28/2021   COPD (chronic obstructive pulmonary disease) ([place]) 05/28/2021   Cataract 05/28/2021   T2DM (type 2 diabetes mellitus) ([place]) 05/28/2021   Depression, major, single episode, complete remission ([place]) 05/28/2021   Anxiety 05/28/2021   B12 deficiency 05/28/2021   Urge incontinence 05/28/2021   Hereditary hemochromatosis ([place]) 11/03/2017    [name] 06/12/2021, 10:32 AM  [facility] Therapy [address] [place], [place], [address] Phone: [phone]   Fax:  [phone]  Name: [name] MRN: [id number] Date of Birth: Apr 11, 1951

## 2021-06-12 NOTE — Patient Instructions (Signed)
Nice to meet you  Lets stop Trelegy  We will get PFTs or pulmonary function tests at your convenience to ascertain if COPD is present or not.  Return to clinic in 3 months or sooner as needed with Dr. Silas Flood

## 2021-06-12 NOTE — Progress Notes (Signed)
$'@Patient'b$  ID: [name], female    DOB: [birth year], 70 y.o.   MRN: [id number]  Chief Complaint  Patient presents with   Consult    Referred by Dr. Lindi Adie for history of COPD. States she was diagnosed about 3-4 years ago. Noticed that the Trelegy 131mg makes her cough worse.     Referring provider: GNicholas Lose MD  HPI:   70y.o. woman whom we are seeing in consultation for evaluation of dyspnea exertion.  Note from referring provider reviewed.  Most recent PCP note reviewed.  Overall, patient doing well.  She reports longstanding history of cough.  In fact as a child she had recurrent bouts of bronchitis with change of seasons.  She moved cigarettes until about 2014.  40+ pack year history.  She states he was diagnosed with COPD based on the pictures.  Told she had a bacteria in her lung.  She was started on Trelegy about 2 years ago.  She had chronic productive cough throughout the day.  She stopped Trelegy about 3 weeks ago.  The frequency and severity of cough has dramatically improved.  She continues to have some cough with sputum production about the day but again markedly improved, not particular bothersome.  She has a baseline dyspnea on exertion.  Worse with stairs or inclines in particular.  No time of day where things are better or worse.  No seasonal environmental changes she can identify.  No position to make things better or worse.  No other alleviating or exacerbating factors.  She is enrolled in CT chest for lung cancer screening.  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  Lung RADS 2.  Small 3 mm right lower lobe nodule noted.  Stable on serial exams dating back to 2019.    PMH: Tobacco abuse in remission, hypertension, emphysema Surgical history: Hysterectomy 1987 Family history: CAD in  first-degree relatives Social history: Former smoker, quit in 2014, 40+ pack year history  Questionaires / Pulmonary Flowsheets:   ACT:  No flowsheet data found.  MMRC: No flowsheet data found.  Epworth:  No flowsheet data found.  Tests:   FENO:  No results found for: NITRICOXIDE  PFT: No flowsheet data found.  WALK:  No flowsheet data found.  Imaging: Personally reviewed and as per EMR discussion this note  Lab Results: Personally reviewed, eosinophils 500 06/11/21 CBC    Component Value Date/Time   WBC 4.8 06/11/2021 1246   WBC 7.2 07/01/2013 0103   RBC 4.39 06/11/2021 1246   HGB 13.6 06/11/2021 1246   HCT 39.6 06/11/2021 1246   PLT 122 (L) 06/11/2021 1246   MCV 90.2 06/11/2021 1246   MCH 31.0 06/11/2021 1246   MCHC 34.3 06/11/2021 1246   RDW 13.2 06/11/2021 1246   LYMPHSABS 0.8 06/11/2021 1246   MONOABS 0.3 06/11/2021 1246   EOSABS 0.5 06/11/2021 1246   BASOSABS 0.0 06/11/2021 1246    BMET    Component Value Date/Time   NA 141 06/11/2021 1246   K 4.5 06/11/2021 1246   CL 105 06/11/2021 1246   CO2 26 06/11/2021 1246   GLUCOSE 201 (H) 06/11/2021 1246   BUN 14 06/11/2021 1246   CREATININE 0.73 06/11/2021 1246   CALCIUM 9.6 06/11/2021 1246   GFRNONAA >60 06/11/2021 1246   GFRAA >60 02/05/2020 1121    BNP No  results found for: BNP  ProBNP No results found for: PROBNP  Specialty Problems       Pulmonary Problems   COPD (chronic obstructive pulmonary disease) (HCC)    No Known Allergies  Immunization History  Administered Date(s) Administered   Fluad Quad(high Dose 65+) 08/18/2019, 07/25/2020   Influenza, High Dose Seasonal PF 08/17/2018   PFIZER(Purple Top)SARS-COV-2 Vaccination 11/09/2019, 11/30/2019, 08/29/2020   Td 07/01/2013    Past Medical History:  Diagnosis Date   Diabetes mellitus without complication ([place])    Hypertension    Vitamin D deficiency 08/2017    Tobacco History: Social History   Tobacco Use  Smoking  Status Former   Packs/day: 1.00   Years: 44.00   Pack years: 44.00   Types: Cigarettes  Smokeless Tobacco Never   Counseling given: Not Answered   Continue to not smoke  Outpatient Encounter Medications as of 06/12/2021  Medication Sig   albuterol (VENTOLIN HFA) 108 (90 Base) MCG/ACT inhaler 2 puffs as needed   amLODipine (NORVASC) 10 MG tablet Take 1 tablet (10 mg total) by mouth daily.   cholecalciferol (VITAMIN D) 1000 units tablet Take 5,000 Units by mouth daily.   Cyanocobalamin (VITAMIN B 12) 500 MCG TABS 1 tablet   Homeopathic Products (LIVER SUPPORT) SUBL Place under the tongue.   losartan (COZAAR) 100 MG tablet Take 1 tablet (100 mg total) by mouth daily.   mirabegron ER (MYRBETRIQ) 25 MG TB24 tablet Take 1 tablet (25 mg total) by mouth daily.   pravastatin (PRAVACHOL) 40 MG tablet Take 1 tablet (40 mg total) by mouth daily.   sertraline (ZOLOFT) 100 MG tablet Take 1 tablet (100 mg total) by mouth daily.   TRELEGY ELLIPTA 100-62.5-25 MCG/INH AEPB    No facility-administered encounter medications on file as of 06/12/2021.     Review of Systems  Review of Systems  Chest with exertion.  No orthopnea or PND.  No lower extremity swelling.  Comprehensive review of systems otherwise negative. Physical Exam  BP 116/74   Pulse 85   Ht '5\' 1"'$  (1.549 m)   Wt 156 lb 12.8 oz (71.1 kg)   SpO2 98% Comment: on RA  BMI 29.63 kg/m   Wt Readings from Last 5 Encounters:  06/12/21 156 lb 12.8 oz (71.1 kg)  06/11/21 157 lb (71.2 kg)  05/28/21 156 lb (70.8 kg)  03/06/21 159 lb 8 oz (72.3 kg)  01/23/21 162 lb 8 oz (73.7 kg)    BMI Readings from Last 5 Encounters:  06/12/21 29.63 kg/m  06/11/21 29.66 kg/m  05/28/21 29.48 kg/m  03/06/21 30.14 kg/m  01/23/21 30.70 kg/m     Physical Exam General: Well-appearing, no acute distress Eyes: EOMI, no icterus Neck: Supple, no JVP Pulmonary: Clear to auscultation bilaterally, no wheeze, a bit distant Cardiovascular: Regular  rate and rhythm, no murmur MSK: No synovitis, no joint effusion Abdomen: Nondistended, bowel sounds present Neuro: Normal gait, no weakness  psych: Normal mood, full affect   Assessment & Plan:   Dyspnea on exertion: Likely multifactorial, possibly due to prior cigarette smoking.  PFTs to evaluate for the presence of COPD.  Possible underlying asthma given her eosinophilia as well as recurrent childhood bronchitis.  We will hold on additional inhaler therapy for now until results of PFTs.  Chronic cough, likely chronic bronchitis in setting of prior cigarette smoking.  Improved with stopping Trelegy.  Trelegy removed from medication list.   Return in about 3 months (around 09/12/2021).   Lanier Clam, MD  06/12/2021     

## 2021-06-13 ENCOUNTER — Other Ambulatory Visit: Payer: Self-pay

## 2021-06-13 ENCOUNTER — Ambulatory Visit (INDEPENDENT_AMBULATORY_CARE_PROVIDER_SITE_OTHER): Payer: Medicare Other | Admitting: Pulmonary Disease

## 2021-06-13 DIAGNOSIS — Z87891 Personal history of nicotine dependence: Secondary | ICD-10-CM | POA: Diagnosis not present

## 2021-06-13 DIAGNOSIS — R0609 Other forms of dyspnea: Secondary | ICD-10-CM

## 2021-06-13 DIAGNOSIS — R0600 Dyspnea, unspecified: Secondary | ICD-10-CM

## 2021-06-13 LAB — PULMONARY FUNCTION TEST
DL/VA % pred: 74 %
DL/VA: 3.14 ml/min/mmHg/L
DLCO cor % pred: 72 %
DLCO cor: 13.35 ml/min/mmHg
DLCO unc % pred: 73 %
DLCO unc: 13.43 ml/min/mmHg
FEF 25-75 Post: 1.12 L/sec
FEF 25-75 Pre: 0.75 L/sec
FEF2575-%Change-Post: 48 %
FEF2575-%Pred-Post: 59 %
FEF2575-%Pred-Pre: 40 %
FEV1-%Change-Post: 13 %
FEV1-%Pred-Post: 74 %
FEV1-%Pred-Pre: 65 %
FEV1-Post: 1.6 L
FEV1-Pre: 1.41 L
FEV1FVC-%Change-Post: 10 %
FEV1FVC-%Pred-Pre: 83 %
FEV6-%Change-Post: 3 %
FEV6-%Pred-Post: 83 %
FEV6-%Pred-Pre: 80 %
FEV6-Post: 2.26 L
FEV6-Pre: 2.18 L
FEV6FVC-%Change-Post: 1 %
FEV6FVC-%Pred-Post: 103 %
FEV6FVC-%Pred-Pre: 102 %
FVC-%Change-Post: 2 %
FVC-%Pred-Post: 80 %
FVC-%Pred-Pre: 78 %
FVC-Post: 2.28 L
FVC-Pre: 2.22 L
Post FEV1/FVC ratio: 70 %
Post FEV6/FVC ratio: 99 %
Pre FEV1/FVC ratio: 64 %
Pre FEV6/FVC Ratio: 98 %
RV % pred: 116 %
RV: 2.39 L
TLC % pred: 96 %
TLC: 4.6 L

## 2021-06-13 NOTE — Progress Notes (Signed)
PFT done today. 

## 2021-06-15 LAB — COLOGUARD: Cologuard: POSITIVE — AB

## 2021-06-16 ENCOUNTER — Telehealth: Payer: Self-pay | Admitting: Hematology and Oncology

## 2021-06-16 ENCOUNTER — Other Ambulatory Visit: Payer: Self-pay

## 2021-06-16 ENCOUNTER — Ambulatory Visit
Admission: RE | Admit: 2021-06-16 | Discharge: 2021-06-16 | Disposition: A | Discharge status: Home / Self Care | Payer: Medicare Other | Source: Ambulatory Visit | Attending: Family Medicine | Admitting: Family Medicine

## 2021-06-16 ENCOUNTER — Encounter: Payer: Self-pay | Admitting: Hematology and Oncology

## 2021-06-16 DIAGNOSIS — M85852 Other specified disorders of bone density and structure, left thigh: Secondary | ICD-10-CM | POA: Diagnosis not present

## 2021-06-16 DIAGNOSIS — M85832 Other specified disorders of bone density and structure, left forearm: Secondary | ICD-10-CM | POA: Diagnosis not present

## 2021-06-16 DIAGNOSIS — M810 Age-related osteoporosis without current pathological fracture: Secondary | ICD-10-CM

## 2021-06-16 DIAGNOSIS — Z1231 Encounter for screening mammogram for malignant neoplasm of breast: Secondary | ICD-10-CM

## 2021-06-16 LAB — HM MAMMOGRAPHY

## 2021-06-16 NOTE — Telephone Encounter (Signed)
Scheduled per 8/25 los. Called pt and left a msg

## 2021-06-17 ENCOUNTER — Encounter: Payer: Self-pay | Admitting: Family Medicine

## 2021-06-17 NOTE — Progress Notes (Signed)
Please inform patient of the following:  Cologuard test positive.  She will need to be referred to GI to have colonoscopy.

## 2021-06-18 ENCOUNTER — Other Ambulatory Visit: Payer: Self-pay

## 2021-06-18 ENCOUNTER — Telehealth: Payer: Self-pay

## 2021-06-18 ENCOUNTER — Ambulatory Visit (INDEPENDENT_AMBULATORY_CARE_PROVIDER_SITE_OTHER): Payer: Medicare Other | Admitting: Physical Therapy

## 2021-06-18 DIAGNOSIS — R195 Other fecal abnormalities: Secondary | ICD-10-CM

## 2021-06-18 DIAGNOSIS — R2689 Other abnormalities of gait and mobility: Secondary | ICD-10-CM | POA: Diagnosis not present

## 2021-06-18 DIAGNOSIS — M545 Low back pain, unspecified: Secondary | ICD-10-CM

## 2021-06-18 DIAGNOSIS — M6281 Muscle weakness (generalized): Secondary | ICD-10-CM | POA: Diagnosis not present

## 2021-06-18 DIAGNOSIS — G8929 Other chronic pain: Secondary | ICD-10-CM

## 2021-06-18 NOTE — Telephone Encounter (Signed)
Called and lm for pt tcb regarding Cologuard results.

## 2021-06-18 NOTE — Telephone Encounter (Signed)
Pt called stating that she had a missed call.

## 2021-06-18 NOTE — Therapy (Signed)
[facility] Physical Therapy [address] [place], [place], [address] Phone: [phone]   Fax:  [phone]  Physical Therapy Treatment/Discharge PHYSICAL THERAPY DISCHARGE SUMMARY  Visits from Start of Care: 24  Current functional level related to goals / functional outcomes: See below   Remaining deficits: See below   Education / Equipment: HEP  Plan: Patient agrees to discharge.  Patient goals were met. Patient is being discharged due to meeting the stated rehab goals.        Patient Details  Name: [name] MRN: [id number] Date of Birth: 10/10/1951 Referring Provider (PT): Dr Harless Nakayama   Encounter Date: 06/18/2021   PT End of Session - 06/18/21 1511     Visit Number 24    Number of Visits 24    Authorization Type BCBS    Progress Note Due on Visit 26    PT Start Time 1430    PT Stop Time 1508    PT Time Calculation (min) 38 min    Activity Tolerance Patient tolerated treatment well    Behavior During Therapy [facility] for tasks assessed/performed             Past Medical History:  Diagnosis Date   Diabetes mellitus without complication ([place])    Hypertension    Vitamin D deficiency 08/2017    Past Surgical History:  Procedure Laterality Date   FRACTURE SURGERY     I & D EXTREMITY Right 07/01/2013   Procedure: IRRIGATION AND DEBRIDEMENT RIGHT LEG;  Surgeon: Newt Minion, MD;  Location: [place];  Service: Orthopedics;  Laterality: Right;   LAPAROSCOPIC HYSTERECTOMY     ORIF FEMUR FRACTURE Right 07/01/2013   Procedure: OPEN REDUCTION INTERNAL FIXATION (ORIF) DISTAL FEMUR FRACTURE;  Surgeon: Newt Minion, MD;  Location: [place];  Service: Orthopedics;  Laterality: Right;    There were no vitals filed for this visit.   Subjective Assessment - 06/18/21 1511     Subjective relays she is doing well, no pain upon arrival, agrees to discharge today.    Diagnostic tests xrays    Patient Stated Goals go up/down stairs like a regular  person, not have as much pain in her back with daily actiivty    Pain Onset More than a month ago    Pain Onset More than a month ago                [facility] PT Assessment - 06/18/21 0001       Assessment   Medical Diagnosis Nondisplaced fx rt pubis and chronic LBP    Referring Provider (PT) Dr Harless Nakayama    Onset Date/Surgical Date 01/14/21      Observation/Other Assessments   Focus on Therapeutic Outcomes (FOTO)  goal was 67% functional, scored 70% today      AROM   Overall AROM Comments lumbar  and hip ROM now WNL      Strength   Overall Strength Comments Rt leg strength 5/5 MMT                           [facility] Adult PT Treatment/Exercise - 06/18/21 0001       Lumbar Exercises: Aerobic   Recumbent Bike L5 X10 min      Lumbar Exercises: Standing   Other Standing Lumbar Exercises hopping on Rt leg with UE support X 10 reps  PT Long Term Goals - 06/18/21 1513       PT LONG TERM GOAL #1   Title I with advanced HEP    Status Achieved      PT LONG TERM GOAL #2   Title improve FOTO =/> 67    Baseline now 70    Status Achieved      PT LONG TERM GOAL #3   Title increase Rt hip strength =/> 5/5 to allow alternating steps on stairs    Baseline now 5    Status Achieved      PT LONG TERM GOAL #4   Title demo good pelvic mobility to assist with stairs    Baseline can do 6 inch stairs reciprocally no UE support    Status Achieved      PT LONG TERM GOAL #5   Title report =/> 75% reduction of back pain with daily acitivty    Status Achieved      PT LONG TERM GOAL #6   Title report =/> 50% reduction in fear of falling    Baseline about the same fear of falling    Status Achieved                   Plan - 06/18/21 1512     Clinical Impression Statement Session today focused on retesting strength, goals, and FOTO functional outcome measure. She has met all PT goals and will be discharged to  independent program. She had no further questions or concerns.    Personal Factors and Comorbidities Age    Examination-Activity Limitations Squat;Stairs    Examination-Participation Restrictions Other    Stability/Clinical Decision Making Stable/Uncomplicated    Rehab Potential Good    PT Frequency 2x / week    PT Duration 4 weeks    PT Treatment/Interventions Stair training;Taping;Patient/family education;Functional mobility training;Moist Heat;Traction;Passive range of motion;Therapeutic exercise;Cryotherapy;Electrical Stimulation;Neuromuscular re-education;Balance training;Manual techniques;Dry needling;Spinal Manipulations    PT Next Visit Plan DC    PT Home Exercise Plan [id number]    Consulted and Agree with Plan of Care Patient             Patient will benefit from skilled therapeutic intervention in order to improve the following deficits and impairments:  Decreased range of motion, Pain, Hypomobility, Postural dysfunction, Decreased strength  Visit Diagnosis: Muscle weakness (generalized)  Chronic midline low back pain without sciatica  Other abnormalities of gait and mobility     Problem List Patient Active Problem List   Diagnosis Date Noted   Hypertension associated with diabetes ([place]) 05/28/2021   Former smoker 05/28/2021   Dyslipidemia associated with type 2 diabetes mellitus ([place]) 05/28/2021   COPD (chronic obstructive pulmonary disease) ([place]) 05/28/2021   Cataract 05/28/2021   T2DM (type 2 diabetes mellitus) ([place]) 05/28/2021   Depression, major, single episode, complete remission ([place]) 05/28/2021   Anxiety 05/28/2021   B12 deficiency 05/28/2021   Urge incontinence 05/28/2021   Hereditary hemochromatosis ([place]) 11/03/2017    [name] 06/18/2021, 3:20 PM  [facility] Physical Therapy [address] [place], [place], [address] Phone: [phone]   Fax:  [phone]  Name: [name] MRN: [id number] Date of Birth:  03-25-1951

## 2021-06-20 ENCOUNTER — Telehealth: Payer: Self-pay | Admitting: Pulmonary Disease

## 2021-06-20 NOTE — Telephone Encounter (Signed)
[facility] pt is calling for the results of the PFT that was done on 08/26.  Please advise. Thanks

## 2021-06-24 ENCOUNTER — Telehealth: Payer: Self-pay

## 2021-06-24 ENCOUNTER — Encounter: Payer: Self-pay | Admitting: Gastroenterology

## 2021-06-24 NOTE — Telephone Encounter (Signed)
Error

## 2021-06-24 NOTE — Telephone Encounter (Signed)
Spoke with the pt  She is asking for interpretation of her PFT results from 06/13/21 Please advise thanks!

## 2021-06-26 NOTE — Telephone Encounter (Signed)
[name], Sharon Gains, MD  Rosana Berger, CMA; P Lb[facility] Triage Pool Caller: Unspecified (6 days ago,  1:08 PM) PFTs consistent with mild COPD but overall numbers are very close to normal. Consider trying a new inhaler (not Trelegy) if feels breathing is worse off the Trelegy.         Spoke with pt and notified of results per Dr. Silas Flood. Pt verbalized understanding and denied any questions. She feels better off of the trelegy and her cough has resovled.

## 2021-07-01 ENCOUNTER — Telehealth: Payer: Medicare Other | Admitting: Physician Assistant

## 2021-07-01 DIAGNOSIS — U071 COVID-19: Secondary | ICD-10-CM | POA: Diagnosis not present

## 2021-07-01 MED ORDER — MOLNUPIRAVIR EUA 200MG CAPSULE
4.0000 | ORAL_CAPSULE | Freq: Two times a day (BID) | ORAL | 0 refills | Status: AC
Start: 2021-07-01 — End: 2021-07-06

## 2021-07-01 MED ORDER — BENZONATATE 100 MG PO CAPS: 100.0000 mg | ORAL_CAPSULE | Freq: Three times a day (TID) | ORAL | 0 refills | Status: DC | PRN

## 2021-07-01 NOTE — Progress Notes (Signed)
Based on the information that you have shared in the e-Visit Questionnaire, we recommend that you convert this visit to a video visit in order for the provider to better assess what is going on.  The provider will be able to give you a more accurate diagnosis and treatment plan if we can more freely discuss your symptoms and with the addition of a virtual examination.   Giving your chronic medical conditions and health history you are at higher risk of complication of infection. As such I recommend switching to a virtual video visit through [employer] (see below) to speak in more detail with one of our providers so antiviral medications can be considered.   If you convert to a video visit, we will bill your insurance (similar to an office visit) and you will not be charged for this e-Visit. You will be able to stay at home and speak with the first available [facility] advanced practice provider. The link to do a video visit is in the drop down Menu tab of your Welcome screen in [place].

## 2021-07-01 NOTE — Progress Notes (Signed)
Virtual Visit Consent   [name], you are scheduled for a virtual visit with a [place] provider today.     Just as with appointments in the office, your consent must be obtained to participate.  Your consent will be active for this visit and any virtual visit you may have with one of our providers in the next 365 days.     If you have a MyChart account, a copy of this consent can be sent to you electronically.  All virtual visits are billed to your insurance company just like a traditional visit in the office.    As this is a virtual visit, video technology does not allow for your provider to perform a traditional examination.  This may limit your provider's ability to fully assess your condition.  If your provider identifies any concerns that need to be evaluated in person or the need to arrange testing (such as labs, EKG, etc.), we will make arrangements to do so.     Although advances in technology are sophisticated, we cannot ensure that it will always work on either your end or our end.  If the connection with a video visit is poor, the visit may have to be switched to a telephone visit.  With either a video or telephone visit, we are not always able to ensure that we have a secure connection.     I need to obtain your verbal consent now.   Are you willing to proceed with your visit today?    [name] has provided verbal consent on 07/01/2021 for a virtual visit (video or telephone).   [name], [place]   Date: 07/01/2021 2:34 PM   Virtual Visit via Video Note   I, [name], connected with  Bahar Turturro  ([id number], Jan 28, 1951) on 07/01/21 at  2:30 PM EDT by a video-enabled telemedicine application and verified that I am speaking with the correct person using two identifiers.  Location: Patient: Virtual Visit Location Patient: Home Provider: Virtual Visit Location Provider: Home Office   I discussed the limitations of evaluation and  management by telemedicine and the availability of in person appointments. The patient expressed understanding and agreed to proceed.    History of Present Illness: [name] is a 70 y.o. who identifies as a female who was assigned female at birth, and is being seen today for COVID-19. Endorses exposure from her son who is COVID-19 + and lives in the house. Endorses starting Sunday with cough, scratchy throat, sinus pressure, body aches and headache. Notes occasional mild chest tightness. Denies chest pain or overt SOB. Denies nausea, vomiting or diarrhea. Has taken Ibuprofen for aches. Is using her rescue inhaler when needed but no more than her usual.   HPI: HPI  Problems:  Patient Active Problem List   Diagnosis Date Noted   Hypertension associated with diabetes ([place]) 05/28/2021   Former smoker 05/28/2021   Dyslipidemia associated with type 2 diabetes mellitus ([place]) 05/28/2021   COPD (chronic obstructive pulmonary disease) ([place]) 05/28/2021   Cataract 05/28/2021   T2DM (type 2 diabetes mellitus) ([place]) 05/28/2021   Depression, major, single episode, complete remission ([place]) 05/28/2021   Anxiety 05/28/2021   B12 deficiency 05/28/2021   Urge incontinence 05/28/2021   Hereditary hemochromatosis ([place]) 11/03/2017    Allergies: No Known Allergies Medications:  Current Outpatient Medications:    benzonatate (TESSALON) 100 MG capsule, Take 1 capsule (100 mg total) by mouth 3 (three) times daily as needed  for cough., Disp: 30 capsule, Rfl: 0   molnupiravir EUA (LAGEVRIO) 200 mg CAPS capsule, Take 4 capsules (800 mg total) by mouth 2 (two) times daily for 5 days., Disp: 40 capsule, Rfl: 0   albuterol (VENTOLIN HFA) 108 (90 Base) MCG/ACT inhaler, 2 puffs as needed, Disp: , Rfl:    amLODipine (NORVASC) 10 MG tablet, Take 1 tablet (10 mg total) by mouth daily., Disp: 90 tablet, Rfl: 3   cholecalciferol (VITAMIN D) 1000 units tablet, Take 5,000 Units by mouth daily., Disp: , Rfl:     Cyanocobalamin (VITAMIN B 12) 500 MCG TABS, 1 tablet, Disp: , Rfl:    Homeopathic Products (LIVER SUPPORT) SUBL, Place under the tongue., Disp: , Rfl:    losartan (COZAAR) 100 MG tablet, Take 1 tablet (100 mg total) by mouth daily., Disp: 90 tablet, Rfl: 3   mirabegron ER (MYRBETRIQ) 25 MG TB24 tablet, Take 1 tablet (25 mg total) by mouth daily., Disp: 30 tablet, Rfl: 5   pravastatin (PRAVACHOL) 40 MG tablet, Take 1 tablet (40 mg total) by mouth daily., Disp: 90 tablet, Rfl: 3   sertraline (ZOLOFT) 100 MG tablet, Take 1 tablet (100 mg total) by mouth daily., Disp: 90 tablet, Rfl: 3  Observations/Objective: Patient is well-developed, well-nourished in no acute distress.  Resting comfortably at home.  Head is normocephalic, atraumatic.  No labored breathing. Speech is clear and coherent with logical content.  Patient is alert and oriented at baseline.   Assessment and Plan: 1. COVID-19 - MyChart COVID-19 home monitoring program; Future - benzonatate (TESSALON) 100 MG capsule; Take 1 capsule (100 mg total) by mouth 3 (three) times daily as needed for cough.  Dispense: 30 capsule; Refill: 0 - molnupiravir EUA (LAGEVRIO) 200 mg CAPS capsule; Take 4 capsules (800 mg total) by mouth 2 (two) times daily for 5 days.  Dispense: 40 capsule; Refill: 0 Patient with multiple risk factors for complicated course of illness. Discussed risks/benefits of antiviral medications including most common potential ADRs. Patient voiced understanding and would like to proceed with antiviral medication. They are candidate for molnupiravir. Rx sent to pharmacy. Supportive measures, OTC medications and vitamin regimen reviewed. Tessalon per orders. Can use her albuterol as directed, when needed. Patient has been enrolled in a MyChart COVID symptom monitoring program. Samule Dry reviewed in detail. Strict ER precautions discussed with patient.    Follow Up Instructions: I discussed the assessment and treatment plan with the  patient. The patient was provided an opportunity to ask questions and all were answered. The patient agreed with the plan and demonstrated an understanding of the instructions.  A copy of instructions were sent to the patient via MyChart.  The patient was advised to call back or seek an in-person evaluation if the symptoms worsen or if the condition fails to improve as anticipated.  Time:  I spent 15 minutes with the patient via telehealth technology discussing the above problems/concerns.    Sharon Rio, PA-C

## 2021-07-01 NOTE — Patient Instructions (Signed)
[name], thank you for joining Sharon Rio, PA-C for today's virtual visit.  While this provider is not your primary care provider (PCP), if your PCP is located in our provider database this encounter information will be shared with them immediately following your visit.  Consent: (Patient) [name] [facility] provided verbal consent for this virtual visit at the beginning of the encounter.  Current Medications:  Current Outpatient Medications:    albuterol (VENTOLIN HFA) 108 (90 Base) MCG/ACT inhaler, 2 puffs as needed, Disp: , Rfl:    amLODipine (NORVASC) 10 MG tablet, Take 1 tablet (10 mg total) by mouth daily., Disp: 90 tablet, Rfl: 3   cholecalciferol (VITAMIN D) 1000 units tablet, Take 5,000 Units by mouth daily., Disp: , Rfl:    Cyanocobalamin (VITAMIN B 12) 500 MCG TABS, 1 tablet, Disp: , Rfl:    Homeopathic Products (LIVER SUPPORT) SUBL, Place under the tongue., Disp: , Rfl:    losartan (COZAAR) 100 MG tablet, Take 1 tablet (100 mg total) by mouth daily., Disp: 90 tablet, Rfl: 3   mirabegron ER (MYRBETRIQ) 25 MG TB24 tablet, Take 1 tablet (25 mg total) by mouth daily., Disp: 30 tablet, Rfl: 5   pravastatin (PRAVACHOL) 40 MG tablet, Take 1 tablet (40 mg total) by mouth daily., Disp: 90 tablet, Rfl: 3   sertraline (ZOLOFT) 100 MG tablet, Take 1 tablet (100 mg total) by mouth daily., Disp: 90 tablet, Rfl: 3   Medications ordered in this encounter:  No orders of the defined types were placed in this encounter.    *If you need refills on other medications prior to your next appointment, please contact your pharmacy*  Follow-Up: Call back or seek an in-person evaluation if the symptoms worsen or if the condition fails to improve as anticipated.  Other Instructions Please keep well-hydrated and get plenty of rest. Start a saline nasal rinse to flush out your nasal passages. You can use plain Mucinex to help thin congestion. If you have a humidifier, running in the  bedroom at night. I want you to start OTC vitamin D3 1000 units daily, vitamin C 1000 mg daily, and a zinc supplement. Please take prescribed medications as directed.  You have been enrolled in a MyChart symptom monitoring program. Please answer these questions daily so we can keep track of how you are doing.  You were to quarantine for 5 days from onset of your symptoms.  After day 5, if you have had no fever and you are feeling better, you can end quarantine but need to mask for an additional 5 days. After day 5 if you have a fever or are having significant symptoms, please quarantine for full 10 days.  If you note any worsening of symptoms, any significant shortness of breath or any chest pain, please seek ER evaluation ASAP.  Please do not delay care!  COVID-19: What to Do if You Are Sick If you test positive and are an older adult or someone who is at high risk of getting very sick from COVID-19, treatment may be available. Contact a healthcare provider right away after a positive test to determine if you are eligible, even if your symptoms are mild right now. You can also visit a Test to Treat location and, if eligible, receive a prescription from a provider. Don't delay: Treatment must be started within the first few days to be effective. If you have a fever, cough, or other symptoms, you might have COVID-19. Most people have mild illness and  are able to recover at home. If you are sick: Keep track of your symptoms. If you have an emergency warning sign (including trouble breathing), call 911. Steps to help prevent the spread of COVID-19 if you are sick If you are sick with COVID-19 or think you might have COVID-19, follow the steps below to care for yourself and to help protect other people in your home and community. Stay home except to get medical care Stay home. Most people with COVID-19 have mild illness and can recover at home without medical care. Do not leave your home, except to  get medical care. Do not visit public areas and do not go to places where you are unable to wear a mask. Take care of yourself. Get rest and stay hydrated. Take over-the-counter medicines, such as acetaminophen, to help you feel better. Stay in touch with your doctor. Call before you get medical care. Be sure to get care if you have trouble breathing, or have any other emergency warning signs, or if you think it is an emergency. Avoid public transportation, ride-sharing, or taxis if possible. Get tested If you have symptoms of COVID-19, get tested. While waiting for test results, stay away from others, including staying apart from those living in your household. Get tested as soon as possible after your symptoms start. Treatments may be available for people with COVID-19 who are at risk for becoming very sick. Don't delay: Treatment must be started early to be effective--some treatments must begin within 5 days of your first symptoms. Contact your healthcare provider right away if your test result is positive to determine if you are eligible. Self-tests are one of several options for testing for the virus that causes COVID-19 and may be more convenient than laboratory-based tests and point-of-care tests. Ask your healthcare provider or your local health department if you need help interpreting your test results. You can visit your state, tribal, local, and territorial health department's website to look for the latest local information on testing sites. Separate yourself from other people As much as possible, stay in a specific room and away from other people and pets in your home. If possible, you should use a separate bathroom. If you need to be around other people or animals in or outside of the home, wear a well-fitting mask. Tell your close contacts that they may have been exposed to COVID-19. An infected person can spread COVID-19 starting 48 hours (or 2 days) before the person has any symptoms or  tests positive. By letting your close contacts know they may have been exposed to COVID-19, you are helping to protect everyone. See COVID-19 and Animals if you have questions about pets. If you are diagnosed with COVID-19, someone from the health department may call you. Answer the call to slow the spread. Monitor your symptoms Symptoms of COVID-19 include fever, cough, or other symptoms. Follow care instructions from your healthcare provider and local health department. Your local health authorities may give instructions on checking your symptoms and reporting information. When to seek emergency medical attention Look for emergency warning signs* for COVID-19. If someone is showing any of these signs, seek emergency medical care immediately: Trouble breathing Persistent pain or pressure in the chest New confusion Inability to wake or stay awake Pale, gray, or blue-colored skin, lips, or nail beds, depending on skin tone *This list is not all possible symptoms. Please call your medical provider for any other symptoms that are severe or concerning to you. Call 911 or  call ahead to your local emergency facility: Notify the operator that you are seeking care for someone who has or may have COVID-19. Call ahead before visiting your doctor Call ahead. Many medical visits for routine care are being postponed or done by phone or telemedicine. If you have a medical appointment that cannot be postponed, call your doctor's office, and tell them you have or may have COVID-19. This will help the office protect themselves and other patients. If you are sick, wear a well-fitting mask You should wear a mask if you must be around other people or animals, including pets (even at home). Wear a mask with the best fit, protection, and comfort for you. You don't need to wear the mask if you are alone. If you can't put on a mask (because of trouble breathing, for example), cover your coughs and sneezes in some other  way. Try to stay at least 6 feet away from other people. This will help protect the people around you. Masks should not be placed on young children under age 49 years, anyone who has trouble breathing, or anyone who is not able to remove the mask without help. Cover your coughs and sneezes Cover your mouth and nose with a tissue when you cough or sneeze. Throw away used tissues in a lined trash can. Immediately wash your hands with soap and water for at least 20 seconds. If soap and water are not available, clean your hands with an alcohol-based hand sanitizer that contains at least 60% alcohol. Clean your hands often Wash your hands often with soap and water for at least 20 seconds. This is especially important after blowing your nose, coughing, or sneezing; going to the bathroom; and before eating or preparing food. Use hand sanitizer if soap and water are not available. Use an alcohol-based hand sanitizer with at least 60% alcohol, covering all surfaces of your hands and rubbing them together until they feel dry. Soap and water are the best option, especially if hands are visibly dirty. Avoid touching your eyes, nose, and mouth with unwashed hands. Handwashing Tips Avoid sharing personal household items Do not share dishes, drinking glasses, cups, eating utensils, towels, or bedding with other people in your home. Wash these items thoroughly after using them with soap and water or put in the dishwasher. Clean surfaces in your home regularly Clean and disinfect high-touch surfaces (for example, doorknobs, tables, handles, light switches, and countertops) in your "sick room" and bathroom. In shared spaces, you should clean and disinfect surfaces and items after each use by the person who is ill. If you are sick and cannot clean, a caregiver or other person should only clean and disinfect the area around you (such as your bedroom and bathroom) on an as needed basis. Your caregiver/other person should  wait as long as possible (at least several hours) and wear a mask before entering, cleaning, and disinfecting shared spaces that you use. Clean and disinfect areas that may have blood, stool, or body fluids on them. Use household cleaners and disinfectants. Clean visible dirty surfaces with household cleaners containing soap or detergent. Then, use a household disinfectant. Use a product from [employer] List N: Disinfectants for Coronavirus ([id number]). Be sure to follow the instructions on the label to ensure safe and effective use of the product. Many products recommend keeping the surface wet with a disinfectant for a certain period of time (look at "contact time" on the product label). You may also need to wear personal protective equipment,  such as gloves, depending on the directions on the product label. Immediately after disinfecting, wash your hands with soap and water for 20 seconds. For completed guidance on cleaning and disinfecting your home, visit Complete Disinfection Guidance. Take steps to improve ventilation at home Improve ventilation (air flow) at home to help prevent from spreading COVID-19 to other people in your household. Clear out COVID-19 virus particles in the air by opening windows, using air filters, and turning on fans in your home. Use this interactive tool to learn how to improve air flow in your home. When you can be around others after being sick with COVID-19 Deciding when you can be around others is different for different situations. Find out when you can safely end home isolation. For any additional questions about your care, contact your healthcare provider or state or local health department. 01/07/2021 Content source: [facility] for Immunization and Respiratory Diseases ([facility]), Division of Viral Diseases This information is not intended to replace advice given to you by your health care provider. Make sure you discuss any questions you have with your health  care provider. Document Revised: 02/20/2021 Document Reviewed: 02/20/2021 Elsevier Patient Education  2022 [employer].      If you have been instructed to have an in-person evaluation today at a local Urgent Care facility, please use the link below. It will take you to a list of all of our available [facility], including address, phone number and hours of operation. Please do not delay care.  [facility]  If you or a family member do not have a primary care provider, use the link below to schedule a visit and establish care. When you choose a [facility] primary care physician or advanced practice provider, you gain a long-term partner in health. Find a Primary Care Provider  Learn more about [facility] in-office and virtual care options: [facility]

## 2021-07-02 ENCOUNTER — Telehealth: Payer: Self-pay

## 2021-07-02 NOTE — Telephone Encounter (Signed)
LMOM and sent note Via "Mychart".

## 2021-07-03 ENCOUNTER — Telehealth: Payer: Self-pay | Admitting: *Deleted

## 2021-07-03 NOTE — Telephone Encounter (Signed)
Pt is participating the in the Covid Questionnaire monitoring and her answer triggered a Acupuncturist for worsening cough.  I got her voicemail and left her a message that I would send her some information via her MyChart to help her with the coughing.  I sent her the information according to the Covid Symptom and Tiered Treatment protocol.

## 2021-07-04 ENCOUNTER — Telehealth: Payer: Self-pay

## 2021-07-04 ENCOUNTER — Encounter: Payer: Self-pay | Admitting: Family Medicine

## 2021-07-04 ENCOUNTER — Telehealth: Payer: Medicare Other | Admitting: Family Medicine

## 2021-07-04 DIAGNOSIS — U071 COVID-19: Secondary | ICD-10-CM

## 2021-07-04 NOTE — Telephone Encounter (Signed)
BPA triggered for worsening symptoms cough. Patient called, left VM to return the call to [phone] to speak to a nurse about her cough.

## 2021-07-04 NOTE — Progress Notes (Signed)
[place]  Referred to Video visit

## 2021-07-04 NOTE — Telephone Encounter (Signed)
Patient return called. She says she tested positive using a home test on Monday, the cough was dry. She had an e-visit on Tuesday and was prescribed the antiviral and tessalon pearles for cough. She says the cough has now become productive with discolored sputum. She denies SOB. I advised Mucinex OTC to use to thin out the secretions, advised to call her PCP for further advice and recommendations. Patient verbalized understanding.

## 2021-08-05 ENCOUNTER — Encounter: Payer: Self-pay | Admitting: Gastroenterology

## 2021-08-05 ENCOUNTER — Other Ambulatory Visit: Payer: Self-pay

## 2021-08-05 ENCOUNTER — Ambulatory Visit (AMBULATORY_SURGERY_CENTER): Payer: Self-pay | Admitting: *Deleted

## 2021-08-05 VITALS — Ht 61.0 in | Wt 155.0 lb

## 2021-08-05 DIAGNOSIS — R195 Other fecal abnormalities: Secondary | ICD-10-CM

## 2021-08-05 NOTE — Progress Notes (Signed)

## 2021-08-11 DIAGNOSIS — L0100 Impetigo, unspecified: Secondary | ICD-10-CM | POA: Diagnosis not present

## 2021-08-19 ENCOUNTER — Ambulatory Visit (AMBULATORY_SURGERY_CENTER): Payer: Medicare Other | Admitting: Gastroenterology

## 2021-08-19 ENCOUNTER — Other Ambulatory Visit: Payer: Self-pay

## 2021-08-19 ENCOUNTER — Encounter: Payer: Self-pay | Admitting: Gastroenterology

## 2021-08-19 VITALS — BP 104/51 | HR 64 | Temp 97.5°F | Resp 17 | Ht 61.0 in | Wt 155.0 lb

## 2021-08-19 DIAGNOSIS — D124 Benign neoplasm of descending colon: Secondary | ICD-10-CM | POA: Diagnosis not present

## 2021-08-19 DIAGNOSIS — D122 Benign neoplasm of ascending colon: Secondary | ICD-10-CM

## 2021-08-19 DIAGNOSIS — D120 Benign neoplasm of cecum: Secondary | ICD-10-CM | POA: Diagnosis not present

## 2021-08-19 DIAGNOSIS — D126 Benign neoplasm of colon, unspecified: Secondary | ICD-10-CM

## 2021-08-19 DIAGNOSIS — K621 Rectal polyp: Secondary | ICD-10-CM | POA: Diagnosis not present

## 2021-08-19 DIAGNOSIS — D123 Benign neoplasm of transverse colon: Secondary | ICD-10-CM

## 2021-08-19 DIAGNOSIS — R195 Other fecal abnormalities: Secondary | ICD-10-CM

## 2021-08-19 DIAGNOSIS — D129 Benign neoplasm of anus and anal canal: Secondary | ICD-10-CM | POA: Diagnosis not present

## 2021-08-19 DIAGNOSIS — K635 Polyp of colon: Secondary | ICD-10-CM | POA: Diagnosis not present

## 2021-08-19 DIAGNOSIS — D128 Benign neoplasm of rectum: Secondary | ICD-10-CM

## 2021-08-19 MED ORDER — SODIUM CHLORIDE 0.9 % IV SOLN: 500.0000 mL | Freq: Once | INTRAVENOUS | Status: DC

## 2021-08-19 NOTE — Progress Notes (Signed)
Called to room to assist during endoscopic procedure.  Patient ID and intended procedure confirmed with present staff. Received instructions for my participation in the procedure from the performing physician.  

## 2021-08-19 NOTE — Progress Notes (Signed)
Referring Provider: Vivi Barrack, MD Primary Care Physician:  Vivi Barrack, MD  Reason for Procedure:  + Cologuard   IMPRESSION:  + Cologuard Appropriate candidate for monitored anesthesia care  PLAN: Colonoscopy in the [facility] today   HPI: [name] is a 69 y.o. female presents for colonoscopy to evaluate a positive Cologuard.  Prior colonoscopy >10 years ago.   No baseline GI symptoms.   No known family history of colon cancer or polyps. No family history of uterine/endometrial cancer, pancreatic cancer or gastric/stomach cancer.   Past Medical History:  Diagnosis Date   Allergy    Anxiety    OCC   Arthritis    COPD (chronic obstructive pulmonary disease) ([place])    Diabetes mellitus without complication (HCC)    no meds   GERD (gastroesophageal reflux disease)    OCC   Hereditary hemochromatosis ([place])    Hyperlipidemia    Hypertension    Vitamin D deficiency 08/2017    Past Surgical History:  Procedure Laterality Date   COLONOSCOPY     10 + years ago   [place]   tibia/ Fiblia fx surgery   GANGLION CYST EXCISION  2012   post surgery infection   I & D EXTREMITY Right 07/01/2013   Procedure: IRRIGATION AND DEBRIDEMENT RIGHT LEG;  Surgeon: Newt Minion, MD;  Location: [facility];  Service: Orthopedics;  Laterality: Right;   LAPAROSCOPIC HYSTERECTOMY     ORIF FEMUR FRACTURE Right 07/01/2013   Procedure: OPEN REDUCTION INTERNAL FIXATION (ORIF) DISTAL FEMUR FRACTURE;  Surgeon: Newt Minion, MD;  Location: [facility];  Service: Orthopedics;  Laterality: Right;    Current Outpatient Medications  Medication Sig Dispense Refill   amLODipine (NORVASC) 10 MG tablet Take 1 tablet (10 mg total) by mouth daily. 90 tablet 3   B Complex Vitamins (B COMPLEX 1 PO) Take by mouth.     cholecalciferol (VITAMIN D) 1000 units tablet Take 5,000 Units by mouth daily.     Homeopathic Products (LIVER SUPPORT) SUBL Place under the tongue.     losartan (COZAAR) 100  MG tablet Take 1 tablet (100 mg total) by mouth daily. 90 tablet 3   mirabegron ER (MYRBETRIQ) 25 MG TB24 tablet Take 1 tablet (25 mg total) by mouth daily. 30 tablet 5   pravastatin (PRAVACHOL) 40 MG tablet Take 1 tablet (40 mg total) by mouth daily. 90 tablet 3   sertraline (ZOLOFT) 100 MG tablet Take 1 tablet (100 mg total) by mouth daily. 90 tablet 3   vitamin C (ASCORBIC ACID) 500 MG tablet Take 500 mg by mouth daily.     albuterol (VENTOLIN HFA) 108 (90 Base) MCG/ACT inhaler 2 puffs as needed (Patient not taking: No sig reported)     Current Facility-Administered Medications  Medication Dose Route Frequency Provider Last Rate Last Admin   0.9 %  sodium chloride infusion  500 mL Intravenous Once [place], MD        Allergies as of 08/19/2021   (No Known Allergies)    Family History  Problem Relation Age of Onset   Breast cancer Neg Hx    Colon polyps Neg Hx    Colon cancer Neg Hx    Esophageal cancer Neg Hx    Rectal cancer Neg Hx    Stomach cancer Neg Hx      Physical Exam: General:   Alert,  well-nourished, pleasant and cooperative in NAD Head:  Normocephalic and atraumatic. Eyes:  Sclera clear, no icterus.   Conjunctiva pink. Mouth:  No deformity or lesions.   Neck:  Supple; no masses or thyromegaly. Lungs:  Clear throughout to auscultation.   No wheezes. Heart:  Regular rate and rhythm; no murmurs. Abdomen:  Soft, non-tender, nondistended, normal bowel sounds, no rebound or guarding.  Msk:  Symmetrical. No boney deformities LAD: No inguinal or umbilical LAD Extremities:  No clubbing or edema. Neurologic:  Alert and  oriented x4;  grossly nonfocal Skin:  No obvious rash or bruise. Psych:  Alert and cooperative. Normal mood and affect.     Temitope Griffing L. Tarri Glenn, MD, MPH 08/19/2021, 1:43 PM

## 2021-08-19 NOTE — Patient Instructions (Signed)
Await pathology results about 2 weeks Resume previous diet Add more high fiber foods to diet  YOU HAD AN ENDOSCOPIC PROCEDURE TODAY: Refer to the procedure report and other information in the discharge instructions given to you for any specific questions about what was found during the examination. If this information does not answer your questions, please call [facility] office at [phone] to clarify.   YOU SHOULD EXPECT: Some feelings of bloating in the abdomen. Passage of more gas than usual. Walking can help get rid of the air that was put into your GI tract during the procedure and reduce the bloating. If you had a lower endoscopy (such as a colonoscopy or flexible sigmoidoscopy) you may notice spotting of blood in your stool or on the toilet paper. Some abdominal soreness may be present for a day or two, also.  DIET: Your first meal following the procedure should be a light meal and then it is ok to progress to your normal diet. A half-sandwich or bowl of soup is an example of a good first meal. Heavy or fried foods are harder to digest and may make you feel nauseous or bloated. Drink plenty of fluids but you should avoid alcoholic beverages for 24 hours. If you had a esophageal dilation, please see attached instructions for diet.    ACTIVITY: Your care partner should take you home directly after the procedure. You should plan to take it easy, moving slowly for the rest of the day. You can resume normal activity the day after the procedure however YOU SHOULD NOT DRIVE, use power tools, machinery or perform tasks that involve climbing or major physical exertion for 24 hours (because of the sedation medicines used during the test).   SYMPTOMS TO REPORT IMMEDIATELY: A gastroenterologist can be reached at any hour. Please call [phone]  for any of the following symptoms:  Following lower endoscopy (colonoscopy, flexible sigmoidoscopy) Excessive amounts of blood in the stool  Significant  tenderness, worsening of abdominal pains  Swelling of the abdomen that is new, acute  Fever of 100 or higher  FOLLOW UP:  If any biopsies were taken you will be contacted by phone or by letter within the next 1-3 weeks. Call [phone]  if you have not heard about the biopsies in 3 weeks.  Please also call with any specific questions about appointments or follow up tests.

## 2021-08-19 NOTE — Progress Notes (Signed)
Pt's states no medical or surgical changes since previsit or office visit. 

## 2021-08-19 NOTE — Progress Notes (Signed)
Report given to PACU, vss 

## 2021-08-19 NOTE — Op Note (Signed)
[place] Patient Name: [name] Procedure Date: 08/19/2021 1:14 PM MRN: [id number] Endoscopist: Thornton Park MD, MD Age: 70 Referring MD:  Date of Birth: 1951-08-09 Gender: Female Account #: [id number] Procedure:                Colonoscopy Indications:              Positive Cologuard test                           Normal colonoscopy >10 years ago                           No known family history of colon cancer or polyps                           No overt GI bleeding Medicines:                Monitored Anesthesia Care Procedure:                Pre-Anesthesia Assessment:                           - Prior to the procedure, a History and Physical                            was performed, and patient medications and                            allergies were reviewed. The patient's tolerance of                            previous anesthesia was also reviewed. The risks                            and benefits of the procedure and the sedation                            options and risks were discussed with the patient.                            All questions were answered, and informed consent                            was obtained. Prior Anticoagulants: The patient has                            taken no previous anticoagulant or antiplatelet                            agents. ASA Grade Assessment: III - A patient with                            severe systemic disease. After reviewing the risks  and benefits, the patient was deemed in                            satisfactory condition to undergo the procedure.                           After obtaining informed consent, the colonoscope                            was passed under direct vision. Throughout the                            procedure, the patient's blood pressure, pulse, and                            oxygen saturations were monitored continuously. The                            CF  [id number] #[id number] was introduced through the anus                            and advanced to the 3 cm into the ileum. A second                            forward view of the right colon was performed. The                            colonoscopy was performed without difficulty. The                            patient tolerated the procedure well. The quality                            of the bowel preparation was good. The terminal                            ileum, ileocecal valve, appendiceal orifice, and                            rectum were photographed. Scope In: 1:48:13 PM Scope Out: 2:12:23 PM Scope Withdrawal Time: 0 hours 19 minutes 28 seconds  Total Procedure Duration: 0 hours 24 minutes 10 seconds  Findings:                 Hemorrhoids were found on perianal exam.                           Multiple small and large-mouthed diverticula were                            found in the sigmoid colon, descending colon and                            ascending colon. The diverticulosis was most dense  in the sigmoid.                           Two sessile polyps were found in the rectum. The                            polyps were 2 to 3 mm in size. These polyps were                            removed with a cold snare. Resection and retrieval                            were complete. Estimated blood loss was minimal.                           A 3 mm polyp was found in the descending colon. The                            polyp was sessile. The polyp was removed with a                            cold snare. Resection and retrieval were complete.                            Estimated blood loss was minimal.                           A 3 mm polyp was found in the mid transverse colon.                            The polyp was flat. The polyp was removed with a                            cold snare. Resection and retrieval were complete.                            Estimated  blood loss was minimal.                           A 6 mm polyp was found in the hepatic flexure. The                            polyp was carpet-like. The polyp was removed with a                            cold snare. Resection and retrieval were complete.                            Area was tattooed with an injection of 4 mL of Spot                            (carbon black).  A 5 mm polyp was found in the ascending colon. The                            polyp was sessile. The polyp was removed with a                            cold snare. Resection and retrieval were complete.                            Estimated blood loss was minimal.                           A 2 mm polyp was found in the cecum. The polyp was                            sessile. The polyp was removed with a cold snare.                            Resection and retrieval were complete. Estimated                            blood loss was minimal.                           The exam was otherwise without abnormality on                            direct and retroflexion views. Complications:            No immediate complications. Estimated blood loss:                            Minimal. Estimated Blood Loss:     Estimated blood loss was minimal. Impression:               - Hemorrhoids found on perianal exam.                           - Diverticulosis in the sigmoid colon, in the                            descending colon and in the ascending colon.                           - Two 2 to 3 mm polyps in the rectum, removed with                            a cold snare. Resected and retrieved.                           - One 3 mm polyp in the descending colon, removed                            with a cold snare.  Resected and retrieved.                           - One 3 mm polyp in the mid transverse colon,                            removed with a cold snare. Resected and retrieved.                            - One 6 mm polyp at the hepatic flexure, removed                            with a cold snare. Resected and retrieved. Tattooed.                           - One 5 mm polyp in the ascending colon, removed                            with a cold snare. Resected and retrieved.                           - One 2 mm polyp in the cecum, removed with a cold                            snare. Resected and retrieved.                           - The examination was otherwise normal on direct                            and retroflexion views. Recommendation:           - Patient has a contact number available for                            emergencies. The signs and symptoms of potential                            delayed complications were discussed with the                            patient. Return to normal activities tomorrow.                            Written discharge instructions were provided to the                            patient.                           - Continue present medications.                           - Await pathology results.                           -  Repeat colonoscopy date to be determined after                            pending pathology results are reviewed for                            surveillance.                           - Follow a high fiber diet. Drink at least 64                            ounces of water daily. Add a daily stool bulking                            agent such as psyllium (an exampled would be                            Metamucil).                           - Emerging evidence supports eating a diet of                            fruits, vegetables, grains, calcium, and yogurt                            while reducing red meat and alcohol may reduce the                            risk of colon cancer. Thornton Park MD, MD 08/19/2021 2:20:36 PM This report has been signed electronically.

## 2021-08-21 ENCOUNTER — Telehealth: Payer: Self-pay

## 2021-08-21 NOTE — Telephone Encounter (Signed)
Left message on follow up call. 

## 2021-08-21 NOTE — Telephone Encounter (Signed)
  Follow up Call-  Call back number 08/19/2021  Post procedure Call Back phone  # [phone]  Permission to leave phone message Yes  Some recent data might be hidden     Patient questions:  Do you have a fever, pain , or abdominal swelling? No. Pain Score  0 *  Have you tolerated food without any problems? Yes.    Have you been able to return to your normal activities? Yes.    Do you have any questions about your discharge instructions: Diet   No. Medications  No. Follow up visit  No.  Do you have questions or concerns about your Care? No.  Actions: * If pain score is 4 or above: No action needed, pain <4.

## 2021-08-26 ENCOUNTER — Encounter: Payer: Self-pay | Admitting: Gastroenterology

## 2021-08-29 ENCOUNTER — Encounter: Payer: Self-pay | Admitting: Pulmonary Disease

## 2021-08-29 ENCOUNTER — Other Ambulatory Visit: Payer: Self-pay

## 2021-08-29 ENCOUNTER — Ambulatory Visit: Payer: Medicare Other | Admitting: Pulmonary Disease

## 2021-08-29 VITALS — BP 118/60 | HR 63 | Temp 98.2°F | Ht 61.0 in | Wt 151.4 lb

## 2021-08-29 DIAGNOSIS — R053 Chronic cough: Secondary | ICD-10-CM

## 2021-08-29 DIAGNOSIS — Z23 Encounter for immunization: Secondary | ICD-10-CM

## 2021-08-29 DIAGNOSIS — J449 Chronic obstructive pulmonary disease, unspecified: Secondary | ICD-10-CM

## 2021-08-29 NOTE — Progress Notes (Signed)
@Patient  ID: [name], female    DOB: February 05, 1951, 70 y.o.   MRN: [id number]  Chief Complaint  Patient presents with   Follow-up    Flu vaccine today Copd  had covid in sept    Referring provider: Vivi Barrack, MD  HPI:   70 y.o. woman whom we are seeing in follow up of dyspnea exertion. PFTs 8/22 with mod COPD, did not meet threshold for BD response as 190 cc.  Returns for routine follow-up.  Doing well.  Had COVID about 6 weeks ago.  Felt bad for couple days.  No issues with breathing.  Whole household was sick with COVID.  There was seems to be improving.  Son with some residual raspy voice.  She continues off inhalers.  Minimal dyspnea.  Asked questions about some clinical trials that been advertised to her.  Discussed the rationale.  Advised given her lack of symptoms, unclear if there will be any real medical benefit, if she desires to move forward with to just listen to the study coordinators and decide for herself if that something she would like to move forward with.   HPI at initial visit: Overall, patient doing well.  She reports longstanding history of cough.  In fact as a child she had recurrent bouts of bronchitis with change of seasons.  She moved cigarettes until about 2014.  40+ pack year history.  She states he was diagnosed with COPD based on the pictures.  Told she had a bacteria in her lung.  She was started on Trelegy about 2 years ago.  She had chronic productive cough throughout the day.  She stopped Trelegy about 3 weeks ago.  The frequency and severity of cough has dramatically improved.  She continues to have some cough with sputum production about the day but again markedly improved, not particular bothersome.  She has a baseline dyspnea on exertion.  Worse with stairs or inclines in particular.  No time of day where things are better or worse.  No seasonal environmental changes she can identify.  No position to make things better or worse.  No other  alleviating or exacerbating factors.  She is enrolled in CT chest for lung cancer screening.  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  Lung RADS 2.  Small 3 mm right lower lobe nodule noted.  Stable on serial exams dating back to 2019.    PMH: Tobacco abuse in remission, hypertension, emphysema Surgical history: Hysterectomy 1987 Family history: CAD in first-degree relatives Social history: Former smoker, quit in 2014, 40+ pack year history  Questionaires / Pulmonary Flowsheets:   ACT:  No flowsheet data found.  MMRC: No flowsheet data found.  Epworth:  No flowsheet data found.  Tests:   FENO:  No results found for: NITRICOXIDE  PFT: PFT Results Latest Ref Rng & Units 06/13/2021  FVC-Pre L 2.22  FVC-Predicted Pre % 78  FVC-Post L 2.28  FVC-Predicted Post % 80  Pre FEV1/FVC % % 64  Post FEV1/FCV % % 70  FEV1-Pre L 1.41  FEV1-Predicted Pre % 65  FEV1-Post L 1.60  DLCO uncorrected ml/min/mmHg 13.43  DLCO UNC% % 73  DLCO corrected ml/min/mmHg 13.35  DLCO COR %Predicted % 72  DLVA Predicted % 74  TLC L 4.60  TLC % Predicted % 96  RV % Predicted %  116  Personally reviewed and interpreted as moderate fixed obstruction, no significant bronchodilator response, notably borderline with 190 cc increase that meets percentage criteria, TLC within the limits, DLCO mildly reduced.  WALK:  No flowsheet data found.  Imaging: Personally reviewed and as per EMR discussion this note  Lab Results: Personally reviewed, eosinophils 500 06/11/21 CBC    Component Value Date/Time   WBC 4.8 06/11/2021 1246   WBC 7.2 07/01/2013 0103   RBC 4.39 06/11/2021 1246   HGB 13.6 06/11/2021 1246   HCT 39.6 06/11/2021 1246   PLT 122 (L) 06/11/2021 1246   MCV 90.2 06/11/2021 1246   MCH 31.0 06/11/2021 1246   MCHC 34.3  06/11/2021 1246   RDW 13.2 06/11/2021 1246   LYMPHSABS 0.8 06/11/2021 1246   MONOABS 0.3 06/11/2021 1246   EOSABS 0.5 06/11/2021 1246   BASOSABS 0.0 06/11/2021 1246    BMET    Component Value Date/Time   NA 141 06/11/2021 1246   NA 142 10/02/2020 0000   K 4.5 06/11/2021 1246   CL 105 06/11/2021 1246   CO2 26 06/11/2021 1246   GLUCOSE 201 (H) 06/11/2021 1246   BUN 14 06/11/2021 1246   BUN 12 10/02/2020 0000   CREATININE 0.73 06/11/2021 1246   CALCIUM 9.6 06/11/2021 1246   GFRNONAA >60 06/11/2021 1246   GFRAA 118 10/02/2020 0000   GFRAA >60 02/05/2020 1121    BNP No results found for: BNP  ProBNP No results found for: PROBNP  Specialty Problems       Pulmonary Problems   COPD (chronic obstructive pulmonary disease) (HCC)    No Known Allergies  Immunization History  Administered Date(s) Administered   Fluad Quad(high Dose 65+) 08/18/2019, 07/25/2020   Influenza Split 06/12/2010   Influenza, High Dose Seasonal PF 08/17/2018   Influenza,inj,Quad PF,6+ Mos 06/28/2013   Moderna Sars-Covid-2 Vaccination 11/09/2019   PFIZER(Purple Top)SARS-COV-2 Vaccination 11/09/2019, 11/30/2019, 08/29/2020   Pneumococcal Conjugate-13 09/30/2018   Pneumococcal Polysaccharide-23 11/03/2019   Td 07/01/2013   Tdap 06/12/2010, 10/02/2020    Past Medical History:  Diagnosis Date   Allergy    Anxiety    OCC   Arthritis    COPD (chronic obstructive pulmonary disease) ([place])    Diabetes mellitus without complication ([place])    no meds   GERD (gastroesophageal reflux disease)    OCC   Hereditary hemochromatosis ([place])    Hyperlipidemia    Hypertension    Vitamin D deficiency 08/2017    Tobacco History: Social History   Tobacco Use  Smoking Status Former   Packs/day: 1.00   Years: 44.00   Pack years: 44.00   Types: Cigarettes  Smokeless Tobacco Never   Counseling given: Not Answered   Continue to not smoke  Outpatient Encounter Medications as of 08/29/2021   Medication Sig   albuterol (VENTOLIN HFA) 108 (90 Base) MCG/ACT inhaler 2 puffs as needed   amLODipine (NORVASC) 10 MG tablet Take 1 tablet (10 mg total) by mouth daily.   B Complex Vitamins (B COMPLEX 1 PO) Take by mouth.   cholecalciferol (VITAMIN D) 1000 units tablet Take 5,000 Units by mouth daily.   Homeopathic Products (LIVER SUPPORT) SUBL Place under the tongue.   losartan (COZAAR) 100 MG tablet Take 1 tablet (100 mg total) by mouth daily.   mirabegron ER (MYRBETRIQ) 25 MG TB24 tablet Take 1 tablet (25 mg total) by mouth daily.   pravastatin (PRAVACHOL) 40 MG tablet Take 1 tablet (40 mg total) by mouth daily.  sertraline (ZOLOFT) 100 MG tablet Take 1 tablet (100 mg total) by mouth daily.   vitamin C (ASCORBIC ACID) 500 MG tablet Take 500 mg by mouth daily.   No facility-administered encounter medications on file as of 08/29/2021.     Review of Systems  Review of Systems  N/a Physical Exam  BP 118/60 (BP Location: Left Arm, Cuff Size: Normal)   Pulse 63   Temp 98.2 F (36.8 C) (Oral)   Ht 5\' 1"  (1.549 m)   Wt 151 lb 6.4 oz (68.7 kg)   SpO2 96%   BMI 28.61 kg/m   Wt Readings from Last 5 Encounters:  08/29/21 151 lb 6.4 oz (68.7 kg)  08/19/21 155 lb (70.3 kg)  08/05/21 155 lb (70.3 kg)  06/12/21 156 lb 12.8 oz (71.1 kg)  06/11/21 157 lb (71.2 kg)    BMI Readings from Last 5 Encounters:  08/29/21 28.61 kg/m  08/19/21 29.29 kg/m  08/05/21 29.29 kg/m  06/12/21 29.63 kg/m  06/11/21 29.66 kg/m     Physical Exam General: Well-appearing, no acute distress Eyes: EOMI, no icterus Neck: Supple, no JVP Pulmonary: Clear to auscultation bilaterally, no wheeze, a bit distant Cardiovascular: Regular rate and rhythm, no murmur MSK: No synovitis, no joint effusion Abdomen: Nondistended, bowel sounds present Neuro: Normal gait, no weakness  psych: Normal mood, full affect   Assessment & Plan:   Dyspnea on exertion due to COPD: Minimal symptoms.  PFTs to with  moderate COPD.  Possible underlying asthma given her eosinophilia as well as recurrent childhood bronchitis.  Just below bronchodilator responsiveness cut off.    Chronic cough, likely chronic bronchitis in setting of prior cigarette smoking.  Improved with stopping Trelegy.  Trelegy removed from medication list.  COPD: Gold A.  Minimal symptoms.  No need for inhalers at this time.   Return in about 1 year (around 08/29/2022).   Lanier Clam, MD 08/29/2021

## 2021-08-29 NOTE — Patient Instructions (Signed)
Nice to see you  No changes to medications, no inhalers  Return to clinic in 1 year or sooner as needed

## 2021-09-12 ENCOUNTER — Inpatient Hospital Stay: Payer: Medicare Other

## 2021-09-12 ENCOUNTER — Other Ambulatory Visit: Payer: Self-pay

## 2021-09-12 ENCOUNTER — Inpatient Hospital Stay: Payer: Medicare Other | Attending: Hematology and Oncology

## 2021-09-12 ENCOUNTER — Other Ambulatory Visit: Payer: Self-pay | Admitting: Hematology and Oncology

## 2021-09-12 LAB — CMP (CANCER CENTER ONLY)
ALT: 51 U/L — ABNORMAL HIGH (ref 0–44)
AST: 41 U/L (ref 15–41)
Albumin: 4 g/dL (ref 3.5–5.0)
Alkaline Phosphatase: 110 U/L (ref 38–126)
Anion gap: 10 (ref 5–15)
BUN: 10 mg/dL (ref 8–23)
CO2: 24 mmol/L (ref 22–32)
Calcium: 8.7 mg/dL — ABNORMAL LOW (ref 8.9–10.3)
Chloride: 108 mmol/L (ref 98–111)
Creatinine: 0.63 mg/dL (ref 0.44–1.00)
GFR, Estimated: >60 mL/min (ref >60)
Glucose, Bld: 139 mg/dL — ABNORMAL HIGH (ref 70–99)
Potassium: 4.2 mmol/L (ref 3.5–5.1)
Sodium: 142 mmol/L (ref 135–145)
Total Bilirubin: 0.6 mg/dL (ref 0.3–1.2)
Total Protein: 6.6 g/dL (ref 6.5–8.1)

## 2021-09-12 LAB — CBC WITH DIFFERENTIAL (CANCER CENTER ONLY)
Abs Immature Granulocytes: 0.01 10*3/uL (ref 0.00–0.07)
Basophils Absolute: 0 10*3/uL (ref 0.0–0.1)
Basophils Relative: 1 %
Eosinophils Absolute: 0.1 10*3/uL (ref 0.0–0.5)
Eosinophils Relative: 4 %
HCT: 38.6 % (ref 36.0–46.0)
Hemoglobin: 12.9 g/dL (ref 12.0–15.0)
Immature Granulocytes: 0 %
Lymphocytes Relative: 19 %
Lymphs Abs: 0.6 10*3/uL — ABNORMAL LOW (ref 0.7–4.0)
MCH: 28.6 pg (ref 26.0–34.0)
MCHC: 33.4 g/dL (ref 30.0–36.0)
MCV: 85.6 fL (ref 80.0–100.0)
Monocytes Absolute: 0.4 10*3/uL (ref 0.1–1.0)
Monocytes Relative: 10 %
Neutro Abs: 2.3 10*3/uL (ref 1.7–7.7)
Neutrophils Relative %: 66 %
Platelet Count: 91 10*3/uL — ABNORMAL LOW (ref 150–400)
RBC: 4.51 MIL/uL (ref 3.87–5.11)
RDW: 14.8 % (ref 11.5–15.5)
WBC Count: 3.5 10*3/uL — ABNORMAL LOW (ref 4.0–10.5)
nRBC: 0 % (ref 0.0–0.2)

## 2021-09-12 LAB — IRON AND TIBC
Iron: 40 ug/dL — ABNORMAL LOW (ref 41–142)
Saturation Ratios: 10 % — ABNORMAL LOW (ref 21–57)
TIBC: 384 ug/dL (ref 236–444)
UIBC: 344 ug/dL (ref 120–384)

## 2021-09-12 LAB — FERRITIN: Ferritin: 25 ng/mL (ref 11–307)

## 2021-09-12 NOTE — Progress Notes (Signed)
[name] presents today for phlebotomy per MD orders. Phlebotomy procedure started at 1102 and ended at 1108. 500 grams removed. Patient observed for 30 minutes after procedure without any incident. Patient tolerated procedure well. IV needle removed intact.  Pt denies any s/s of adverse effects of phlebotomy.  Pt left the unit ambulatory.  Per Dr. Alvy Bimler do therapeutic phlebotomy with  HBG 12.9, HCT 38.6, PLT 91 and ferritin 20 (06/12/21).

## 2021-09-12 NOTE — Patient Instructions (Signed)

## 2021-09-13 LAB — HEMOGLOBIN A1C
Hgb A1c MFr Bld: 6.5 % — ABNORMAL HIGH (ref 4.8–5.6)
Mean Plasma Glucose: 140 mg/dL

## 2021-09-17 ENCOUNTER — Other Ambulatory Visit: Payer: Self-pay | Admitting: Hematology and Oncology

## 2021-09-18 ENCOUNTER — Other Ambulatory Visit: Payer: Self-pay

## 2021-09-18 ENCOUNTER — Ambulatory Visit (INDEPENDENT_AMBULATORY_CARE_PROVIDER_SITE_OTHER): Payer: Medicare Other | Admitting: Dermatology

## 2021-09-18 DIAGNOSIS — H61001 Unspecified perichondritis of right external ear: Secondary | ICD-10-CM

## 2021-09-18 MED ORDER — TRIAMCINOLONE ACETONIDE 10 MG/ML IJ SUSP
10.0000 mg | Freq: Once | INTRAMUSCULAR | Status: AC
Start: 2021-09-18 — End: 2021-09-18
  Administered 2021-09-18: 10 mg

## 2021-09-18 NOTE — Patient Instructions (Signed)

## 2021-10-07 ENCOUNTER — Encounter: Payer: Self-pay | Admitting: Dermatology

## 2021-10-07 NOTE — Progress Notes (Signed)
° °  Follow-Up Visit   Subjective  Krysia Zahradnik is a 70 y.o. female who presents for the following: Procedure (Cyst on face. ).  Lesion on ear Location:  Duration:  Quality:  Associated Signs/Symptoms: Modifying Factors:  Severity:  Timing: Context:   Objective  Well appearing patient in no apparent distress; mood and affect are within normal limits. Right Anterior Helix Subtle pink 3 mm papule inflammation with history of pain, chondrodermatitis versus squamous cell carcinoma.    A focused examination was performed including head and neck. Relevant physical exam findings are noted in the Assessment and Plan.   Assessment & Plan    CNH (chondrodermatitis nodularis helicis), right Right Anterior Helix  Intralesional triamcinolone.  Return for biopsy if not improved in 1 to 2 months.  Intralesional injection - Right Anterior Helix Location: Right Helix Ear  Informed Consent: Discussed risks (infection, pain, bleeding, bruising, thinning of the skin, loss of skin pigment, lack of resolution, and recurrence of lesion) and benefits of the procedure, as well as the alternatives. Informed consent was obtained. Preparation: The area was prepared a standard fashion.  Procedure Details: An intralesional injection was performed with Kenalog 10 mg/cc. 0.1 cc in total were injected.  Total number of injections: 1  Plan: The patient was instructed on post-op care. Recommend OTC analgesia as needed for pain.   Related Medications triamcinolone acetonide (KENALOG) 10 MG/ML injection 10 mg       I, Lavonna Monarch, MD, have reviewed all documentation for this visit.  The documentation on 10/07/21 for the exam, diagnosis, procedures, and orders are all accurate and complete.

## 2021-10-09 ENCOUNTER — Encounter: Payer: Medicare Other | Admitting: Family Medicine

## 2021-10-15 ENCOUNTER — Ambulatory Visit: Payer: Medicare Other | Admitting: Dermatology

## 2021-10-16 ENCOUNTER — Telehealth: Payer: Self-pay | Admitting: Dermatology

## 2021-10-16 NOTE — Telephone Encounter (Signed)
Patient left message on our VM asking for ST's nurse to call her back at our earliest convenience.

## 2021-10-16 NOTE — Telephone Encounter (Signed)
Patient calling stating that injections didn't help and she just wants referra done. Per dr tafeen refer to ENT doctor. Informed patient that I don't think that she needs [place] to do a referral she can call ENT of her choice. Told to call us back if she has to be referred.

## 2021-10-24 ENCOUNTER — Other Ambulatory Visit: Payer: Self-pay | Admitting: Family Medicine

## 2021-10-24 DIAGNOSIS — H61009 Unspecified perichondritis of external ear, unspecified ear: Secondary | ICD-10-CM

## 2021-10-27 ENCOUNTER — Ambulatory Visit (INDEPENDENT_AMBULATORY_CARE_PROVIDER_SITE_OTHER): Payer: Medicare Other

## 2021-10-27 ENCOUNTER — Other Ambulatory Visit: Payer: Self-pay

## 2021-10-27 DIAGNOSIS — Z0000 Encounter for general adult medical examination without abnormal findings: Secondary | ICD-10-CM | POA: Diagnosis not present

## 2021-10-27 NOTE — Progress Notes (Signed)
Virtual Visit via Telephone Note  I connected with  [name] on 10/27/21 at 10:15 AM EST by telephone and verified that I am speaking with the correct person using two identifiers.  Medicare Annual Wellness visit completed telephonically due to Covid-19 pandemic.   Persons participating in this call: This Health Coach and this patient.   Location: Patient: home Provider: office   I discussed the limitations, risks, security and privacy concerns of performing an evaluation and management service by telephone and the availability of in person appointments. The patient expressed understanding and agreed to proceed.  Unable to perform video visit due to video visit attempted and failed and/or patient does not have video capability.   Some vital signs may be absent or patient reported.   Sharon Brace, LPN   Subjective:   [name] is a 71 y.o. female who presents for an Initial Medicare Annual Wellness Visit.  Review of Systems     Cardiac Risk Factors include: advanced age (>49men, >69 women);hypertension;dyslipidemia     Objective:    There were no vitals filed for this visit. There is no height or weight on file to calculate BMI.  Advanced Directives 10/27/2021 02/25/2021 07/25/2020 06/12/2020 03/20/2020 02/07/2020 11/15/2019  Does Patient Have a Medical Advance Directive? Yes Yes Yes Yes Yes Yes Yes  Type of Academic librarian Living will [place];Living will [place];Living will [place];Living will [place];Living will [place];Living will  Does patient want to make changes to medical advance directive? - - No - Patient declined - No - Patient declined - -  Copy of [place] in Chart? No - copy requested - No - copy requested No - copy requested - - No - copy requested  Would patient like information on creating a  medical advance directive? - - - - - - -  Pre-existing out of facility DNR order (yellow form or pink MOST form) - - - - - - -    Current Medications (verified) Outpatient Encounter Medications as of 10/27/2021  Medication Sig   albuterol (VENTOLIN HFA) 108 (90 Base) MCG/ACT inhaler 2 puffs as needed   amLODipine (NORVASC) 10 MG tablet Take 1 tablet (10 mg total) by mouth daily.   B Complex Vitamins (B COMPLEX 1 PO) Take by mouth.   cholecalciferol (VITAMIN D) 1000 units tablet Take 5,000 Units by mouth daily.   Homeopathic Products (LIVER SUPPORT) SUBL Place under the tongue.   losartan (COZAAR) 100 MG tablet Take 1 tablet (100 mg total) by mouth daily.   mirabegron ER (MYRBETRIQ) 25 MG TB24 tablet Take 1 tablet (25 mg total) by mouth daily.   pravastatin (PRAVACHOL) 40 MG tablet Take 1 tablet (40 mg total) by mouth daily.   sertraline (ZOLOFT) 100 MG tablet Take 1 tablet (100 mg total) by mouth daily.   vitamin C (ASCORBIC ACID) 500 MG tablet Take 500 mg by mouth daily.   No facility-administered encounter medications on file as of 10/27/2021.    Allergies (verified) Patient has no known allergies.   History: Past Medical History:  Diagnosis Date   Allergy    Anxiety    OCC   Arthritis    COPD (chronic obstructive pulmonary disease) (HCC)    Diabetes mellitus without complication (HCC)    no meds   GERD (gastroesophageal reflux disease)    OCC   Hereditary hemochromatosis ([place])  Hyperlipidemia    Hypertension    Vitamin D deficiency 08/2017   Past Surgical History:  Procedure Laterality Date   COLONOSCOPY     10 + years ago   Zap fx surgery   GANGLION CYST EXCISION  2012   post surgery infection   I & D EXTREMITY Right 07/01/2013   Procedure: IRRIGATION AND DEBRIDEMENT RIGHT LEG;  Surgeon: Newt Minion, MD;  Location: [facility];  Service: Orthopedics;  Laterality: Right;   LAPAROSCOPIC HYSTERECTOMY     ORIF FEMUR FRACTURE Right  07/01/2013   Procedure: OPEN REDUCTION INTERNAL FIXATION (ORIF) DISTAL FEMUR FRACTURE;  Surgeon: Newt Minion, MD;  Location: [facility];  Service: Orthopedics;  Laterality: Right;   Family History  Problem Relation Age of Onset   Breast cancer Neg Hx    Colon polyps Neg Hx    Colon cancer Neg Hx    Esophageal cancer Neg Hx    Rectal cancer Neg Hx    Stomach cancer Neg Hx    Social History   Socioeconomic History   Marital status: Married    Spouse name: Not on file   Number of children: Not on file   Years of education: Not on file   Highest education level: Not on file  Occupational History   Not on file  Tobacco Use   Smoking status: Former    Packs/day: 1.00    Years: 44.00    Pack years: 44.00    Types: Cigarettes   Smokeless tobacco: Never  Substance and Sexual Activity   Alcohol use: Yes    Alcohol/week: 2.0 standard drinks    Types: 2 Glasses of wine per week   Drug use: Never   Sexual activity: Not on file  Other Topics Concern   Not on file  Social History Narrative   Not on file   Social Determinants of Health   Financial Resource Strain: Low Risk    Difficulty of Paying Living Expenses: Not hard at all  Food Insecurity: No Food Insecurity   Worried About Charity fundraiser in the Last Year: Never true   [place] in the Last Year: Never true  Transportation Needs: No Transportation Needs   Lack of Transportation (Medical): No   Lack of Transportation (Non-Medical): No  Physical Activity: Insufficiently Active   Days of Exercise per Week: 3 days   Minutes of Exercise per Session: 40 min  Stress: No Stress Concern Present   Feeling of Stress : Only a little  Social Connections: Moderately Integrated   Frequency of Communication with Friends and Family: More than three times a week   Frequency of Social Gatherings with Friends and Family: More than three times a week   Attends Religious Services: Never   Marine scientist or Organizations:  Yes   Attends Music therapist: 1 to 4 times per year   Marital Status: Married    Tobacco Counseling Counseling given: Not Answered   Clinical Intake:  Pre-visit preparation completed: Yes  Pain : No/denies pain     BMI - recorded: 28.62 Nutritional Status: BMI 25 -29 Overweight Nutritional Risks: None Diabetes: Yes CBG done?: No Did pt. bring in CBG monitor from home?: No  How often do you need to have someone help you when you read instructions, pamphlets, or other written materials from your doctor or pharmacy?: 1 - Never  Diabetic?Nutrition Risk Assessment:  Has the patient  had any N/V/D within the last 2 months?  No  Does the patient have any non-healing wounds?  No  Has the patient had any unintentional weight loss or weight gain?  No   Diabetes:  Is the patient diabetic?  Yes  If diabetic, was a CBG obtained today?  No  Did the patient bring in their glucometer from home?  No  How often do you monitor your CBG's? N/A.   Financial Strains and Diabetes Management:  Are you having any financial strains with the device, your supplies or your medication? No .  Does the patient want to be seen by Chronic Care Management for management of their diabetes?  No  Would the patient like to be referred to a Nutritionist or for Diabetic Management?  No   Diabetic Exams:  Diabetic Eye Exam: Overdue for diabetic eye exam. Pt has been advised about the importance in completing this exam. Patient advised to call and schedule an eye exam. Diabetic Foot Exam: Overdue, Pt has been advised about the importance in completing this exam. Pt is scheduled for diabetic foot exam on next appt .   Interpreter Needed?: No  Information entered by :: Charlott Rakes, LPN   Activities of Daily Living In your present state of health, do you have any difficulty performing the following activities: 10/27/2021  Hearing? N  Vision? N  Difficulty concentrating or making  decisions? N  Walking or climbing stairs? Y  Comment at times  Dressing or bathing? N  Doing errands, shopping? N  Preparing Food and eating ? N  Using the Toilet? N  In the past six months, have you accidently leaked urine? N  Do you have problems with loss of bowel control? N  Managing your Medications? N  Managing your Finances? N  Housekeeping or managing your Housekeeping? N  Some recent data might be hidden    Patient Care Team: Vivi Barrack, MD as PCP - General (Family Medicine) Lavonna Monarch, MD as Consulting Physician (Dermatology)  Indicate any recent Medical Services you may have received from other than [facility] providers in the past year (date may be approximate).     Assessment:   This is a routine wellness examination for Shamyah.  Hearing/Vision screen Hearing Screening - Comments:: Pt denies any hearing  Vision Screening - Comments:: [facility]   Dietary issues and exercise activities discussed: Current Exercise Habits: Home exercise routine, Type of exercise: Other - see comments, Time (Minutes): 40, Frequency (Times/Week): 3, Weekly Exercise (Minutes/Week): 120   Goals Addressed             This Visit's Progress    Patient Stated       Lose weight        Depression Screen PHQ 2/9 Scores 10/27/2021 05/28/2021 11/16/2017 11/09/2017 11/02/2017  PHQ - 2 Score 0 0 0 0 0    Fall Risk Fall Risk  10/27/2021 05/28/2021 11/16/2017 11/09/2017 11/02/2017  Falls in the past year? - 0 No No No  Number falls in past yr: 0 0 - - -  Injury with Fall? 0 0 - - -  Risk for fall due to : Impaired vision No Fall Risks - - -  Follow up Falls prevention discussed - - - -    FALL RISK PREVENTION PERTAINING TO THE HOME:  Any stairs in or around the home? Yes  If so, are there any without handrails? No  Home free of  loose throw rugs in walkways, pet beds, electrical cords, etc? Yes  Adequate lighting in your home to reduce risk of  falls? Yes   ASSISTIVE DEVICES UTILIZED TO PREVENT FALLS:  Life alert? No  Use of a cane, walker or w/c? No  Grab bars in the bathroom? No  Shower chair or bench in shower? No  Elevated toilet seat or a handicapped toilet? No   TIMED UP AND GO:  Was the test performed? No .  Cognitive Function:     6CIT Screen 10/27/2021  What Year? 0 points  What month? 0 points  What time? 0 points  Count back from 20 0 points  Months in reverse 0 points  Repeat phrase 0 points  Total Score 0    Immunizations Immunization History  Administered Date(s) Administered   Fluad Quad(high Dose 65+) 08/18/2019, 07/25/2020, 08/29/2021   Influenza Split 06/12/2010   Influenza, High Dose Seasonal PF 08/17/2018   Influenza,inj,Quad PF,6+ Mos 06/28/2013   Moderna Sars-Covid-2 Vaccination 11/09/2019   PFIZER(Purple Top)SARS-COV-2 Vaccination 11/09/2019, 11/30/2019, 08/29/2020   Pneumococcal Conjugate-13 09/30/2018   Pneumococcal Polysaccharide-23 11/03/2019   Td 07/01/2013   Tdap 06/12/2010, 10/02/2020    TDAP status: Up to date  Flu Vaccine status: Up to date  Pneumococcal vaccine status: Up to date  Covid-19 vaccine status: Completed vaccines  Screening Tests Health Maintenance  Topic Date Due   FOOT EXAM  Never done   OPHTHALMOLOGY EXAM  Never done   Hepatitis C Screening  Never done   Zoster Vaccines- Shingrix (1 of 2) Never done   COVID-19 Vaccine (5 - Booster) 10/24/2020   HEMOGLOBIN A1C  03/12/2022   MAMMOGRAM  06/17/2023   Fecal DNA (Cologuard)  06/09/2024   TETANUS/TDAP  10/02/2030   Pneumonia Vaccine 62+ Years old  Completed   INFLUENZA VACCINE  Completed   DEXA SCAN  Completed   HPV VACCINES  Aged Out   COLONOSCOPY (Pts 45-53yrs Insurance coverage will need to be confirmed)  Discontinued    Health Maintenance  Health Maintenance Due  Topic Date Due   FOOT EXAM  Never done   OPHTHALMOLOGY EXAM  Never done   Hepatitis C Screening  Never done   Zoster Vaccines-  Shingrix (1 of 2) Never done   COVID-19 Vaccine (5 - Booster) 10/24/2020    Colorectal cancer screening: Type of screening: Cologuard. Completed 06/09/21. Repeat every 3 years  Mammogram status: Completed 06/16/21. Repeat every year  Bone Density status: Completed 06/16/21. Results reflect: Bone density results: OSTEOPENIA. Repeat every 2 years.   Additional Screening:  Hepatitis C Screening: does qualify;  Vision Screening: Recommended annual ophthalmology exams for early detection of glaucoma and other disorders of the eye. Is the patient up to date with their annual eye exam?  Yes  Who is the provider or what is the name of the office in which the patient attends annual eye exams? [facility] If pt is not established with a provider, would they like to be referred to a provider to establish care? No .   Dental Screening: Recommended annual dental exams for proper oral hygiene  Community Resource Referral / Chronic Care Management: CRR required this visit?  No   CCM required this visit?  No      Plan:     I have personally reviewed and noted the following in the patients chart:   Medical and social history Use of alcohol, tobacco or illicit drugs  Current medications and supplements including opioid prescriptions. Patient  is not currently taking opioid prescriptions. Functional ability and status Nutritional status Physical activity Advanced directives List of other physicians Hospitalizations, surgeries, and ER visits in previous 12 months Vitals Screenings to include cognitive, depression, and falls Referrals and appointments  In addition, I have reviewed and discussed with patient certain preventive protocols, quality metrics, and best practice recommendations. A written personalized care plan for preventive services as well as general preventive health recommendations were provided to patient.     Sharon Brace, LPN   06/26/9891   Nurse Notes: none

## 2021-10-27 NOTE — Patient Instructions (Signed)
Ms. [name] , Thank you for taking time to come for your Medicare Wellness Visit. I appreciate your ongoing commitment to your health goals. Please review the following plan we discussed and let me know if I can assist you in the future.   Screening recommendations/referrals: Colonoscopy: Done 06/09/21 cologuard repeat every 3 years  Mammogram: Done 06/16/21 repeat every year  Bone Density: Done 06/16/21 repeat every year  Recommended yearly ophthalmology/optometry visit for glaucoma screening and checkup Recommended yearly dental visit for hygiene and checkup  Vaccinations: Influenza vaccine: Done 08/29/21 Pneumococcal vaccine: Up to date Tdap vaccine: Done 10/02/20 repeat every 10 years    Covid-19:Completed 1/21, 2/11, 08/29/20 & 11/08/20  Advanced directives: Please bring a copy of your health care power of attorney and living will to the office at your convenience.  Conditions/risks identified: lose weight   Next appointment: Follow up in one year for your annual wellness visit    Preventive Care 70 Years and Older, Female Preventive care refers to lifestyle choices and visits with your health care provider that can promote health and wellness. What does preventive care include? A yearly physical exam. This is also called an annual well check. Dental exams once or twice a year. Routine eye exams. Ask your health care provider how often you should have your eyes checked. Personal lifestyle choices, including: Daily care of your teeth and gums. Regular physical activity. Eating a healthy diet. Avoiding tobacco and drug use. Limiting alcohol use. Practicing safe sex. Taking low-dose aspirin every day. Taking vitamin and mineral supplements as recommended by your health care provider. What happens during an annual well check? The services and screenings done by your health care provider during your annual well check will depend on your age, overall health, lifestyle risk factors, and  family history of disease. Counseling  Your health care provider may ask you questions about your: Alcohol use. Tobacco use. Drug use. Emotional well-being. Home and relationship well-being. Sexual activity. Eating habits. History of falls. Memory and ability to understand (cognition). Work and work Statistician. Reproductive health. Screening  You may have the following tests or measurements: Height, weight, and BMI. Blood pressure. Lipid and cholesterol levels. These may be checked every 5 years, or more frequently if you are over 27 years old. Skin check. Lung cancer screening. You may have this screening every year starting at age 54 if you have a 30-pack-year history of smoking and currently smoke or have quit within the past 15 years. Fecal occult blood test (FOBT) of the stool. You may have this test every year starting at age 16. Flexible sigmoidoscopy or colonoscopy. You may have a sigmoidoscopy every 5 years or a colonoscopy every 10 years starting at age 90. Hepatitis C blood test. Hepatitis B blood test. Sexually transmitted disease (STD) testing. Diabetes screening. This is done by checking your blood sugar (glucose) after you have not eaten for a while (fasting). You may have this done every 1-3 years. Bone density scan. This is done to screen for osteoporosis. You may have this done starting at age 7. Mammogram. This may be done every 1-2 years. Talk to your health care provider about how often you should have regular mammograms. Talk with your health care provider about your test results, treatment options, and if necessary, the need for more tests. Vaccines  Your health care provider may recommend certain vaccines, such as: Influenza vaccine. This is recommended every year. Tetanus, diphtheria, and acellular pertussis (Tdap, Td) vaccine. You may need a  Td booster every 10 years. Zoster vaccine. You may need this after age 40. Pneumococcal 13-valent conjugate  (PCV13) vaccine. One dose is recommended after age 98. Pneumococcal polysaccharide (PPSV23) vaccine. One dose is recommended after age 49. Talk to your health care provider about which screenings and vaccines you need and how often you need them. This information is not intended to replace advice given to you by your health care provider. Make sure you discuss any questions you have with your health care provider. Document Released: 11/01/2015 Document Revised: 06/24/2016 Document Reviewed: 08/06/2015 Elsevier Interactive Patient Education  2017 [place] Prevention in the Home Falls can cause injuries. They can happen to people of all ages. There are many things you can do to make your home safe and to help prevent falls. What can I do on the outside of my home? Regularly fix the edges of walkways and driveways and fix any cracks. Remove anything that might make you trip as you walk through a door, such as a raised step or threshold. Trim any bushes or trees on the path to your home. Use bright outdoor lighting. Clear any walking paths of anything that might make someone trip, such as rocks or tools. Regularly check to see if handrails are loose or broken. Make sure that both sides of any steps have handrails. Any raised decks and porches should have guardrails on the edges. Have any leaves, snow, or ice cleared regularly. Use sand or salt on walking paths during winter. Clean up any spills in your garage right away. This includes oil or grease spills. What can I do in the bathroom? Use night lights. Install grab bars by the toilet and in the tub and shower. Do not use towel bars as grab bars. Use non-skid mats or decals in the tub or shower. If you need to sit down in the shower, use a plastic, non-slip stool. Keep the floor dry. Clean up any water that spills on the floor as soon as it happens. Remove soap buildup in the tub or shower regularly. Attach bath mats securely with  double-sided non-slip rug tape. Do not have throw rugs and other things on the floor that can make you trip. What can I do in the bedroom? Use night lights. Make sure that you have a light by your bed that is easy to reach. Do not use any sheets or blankets that are too big for your bed. They should not hang down onto the floor. Have a firm chair that has side arms. You can use this for support while you get dressed. Do not have throw rugs and other things on the floor that can make you trip. What can I do in the kitchen? Clean up any spills right away. Avoid walking on wet floors. Keep items that you use a lot in easy-to-reach places. If you need to reach something above you, use a strong step stool that has a grab bar. Keep electrical cords out of the way. Do not use floor polish or wax that makes floors slippery. If you must use wax, use non-skid floor wax. Do not have throw rugs and other things on the floor that can make you trip. What can I do with my stairs? Do not leave any items on the stairs. Make sure that there are handrails on both sides of the stairs and use them. Fix handrails that are broken or loose. Make sure that handrails are as long as the stairways. Check any  carpeting to make sure that it is firmly attached to the stairs. Fix any carpet that is loose or worn. Avoid having throw rugs at the top or bottom of the stairs. If you do have throw rugs, attach them to the floor with carpet tape. Make sure that you have a light switch at the top of the stairs and the bottom of the stairs. If you do not have them, ask someone to add them for you. What else can I do to help prevent falls? Wear shoes that: Do not have high heels. Have rubber bottoms. Are comfortable and fit you well. Are closed at the toe. Do not wear sandals. If you use a stepladder: Make sure that it is fully opened. Do not climb a closed stepladder. Make sure that both sides of the stepladder are locked  into place. Ask someone to hold it for you, if possible. Clearly mark and make sure that you can see: Any grab bars or handrails. First and last steps. Where the edge of each step is. Use tools that help you move around (mobility aids) if they are needed. These include: Canes. Walkers. Scooters. Crutches. Turn on the lights when you go into a dark area. Replace any light bulbs as soon as they burn out. Set up your furniture so you have a clear path. Avoid moving your furniture around. If any of your floors are uneven, fix them. If there are any pets around you, be aware of where they are. Review your medicines with your doctor. Some medicines can make you feel dizzy. This can increase your chance of falling. Ask your doctor what other things that you can do to help prevent falls. This information is not intended to replace advice given to you by your health care provider. Make sure you discuss any questions you have with your health care provider. Document Released: 08/01/2009 Document Revised: 03/12/2016 Document Reviewed: 11/09/2014 Elsevier Interactive Patient Education  2017 [employer].

## 2021-10-28 ENCOUNTER — Ambulatory Visit: Payer: Medicare Other

## 2021-11-24 ENCOUNTER — Other Ambulatory Visit: Payer: Self-pay

## 2021-11-24 ENCOUNTER — Encounter: Payer: Self-pay | Admitting: Plastic Surgery

## 2021-11-24 ENCOUNTER — Ambulatory Visit: Payer: Medicare Other | Admitting: Plastic Surgery

## 2021-11-24 DIAGNOSIS — L989 Disorder of the skin and subcutaneous tissue, unspecified: Secondary | ICD-10-CM | POA: Diagnosis not present

## 2021-11-24 NOTE — Progress Notes (Signed)
Patient ID: [name], female    DOB: 03-Apr-1951, 71 y.o.   MRN: [id number]   Chief Complaint  Patient presents with   Advice Only    The patient is a 71 year old female here for evaluation of her right ear.  The patient has a 2 to 3 mm flesh-colored lesion in the concha of her right ear.  She says she keeps picking at it it comes off and then it grows back.  She also had an episode of a lot of pain in that ear.  She was given a shot she thinks it may have been a steroid by the dermatologist.  That seems to have helped.  She also had irritation and underwent antibiotic treatment.  She says that did not do anything.  Right now its not hurting but the lesion is there and she is worried about it.  It is difficult to see the contra in the superior portion of the concha at the base of the anterior antihelical root.  The rest of her ear looks fine and today its not tender.  The patient said the dermatologist was planning on doing a biopsy.   Review of Systems  Constitutional: Negative.   HENT: Negative.    Eyes: Negative.   Respiratory: Negative.    Cardiovascular: Negative.   Gastrointestinal: Negative.   Endocrine: Negative.   Genitourinary: Negative.   Musculoskeletal: Negative.   Skin: Negative.   Hematological: Negative.    Past Medical History:  Diagnosis Date   Allergy    Anxiety    OCC   Arthritis    COPD (chronic obstructive pulmonary disease) ([facility])    Diabetes mellitus without complication (HCC)    no meds   GERD (gastroesophageal reflux disease)    OCC   Hereditary hemochromatosis ([place])    Hyperlipidemia    Hypertension    Vitamin D deficiency 08/2017    Past Surgical History:  Procedure Laterality Date   COLONOSCOPY     10 + years ago   [place]   [place]ibia/ [place] fx surgery   GANGLION CYST EXCISION  2012   post surgery infection   I & D EXTREMITY Right 07/01/2013   Procedure: IRRIGATION AND DEBRIDEMENT RIGHT LEG;  Surgeon: Newt Minion,  MD;  Location: [facility];  Service: Orthopedics;  Laterality: Right;   LAPAROSCOPIC HYSTERECTOMY     ORIF FEMUR FRACTURE Right 07/01/2013   Procedure: OPEN REDUCTION INTERNAL FIXATION (ORIF) DISTAL FEMUR FRACTURE;  Surgeon: Newt Minion, MD;  Location: [facility];  Service: Orthopedics;  Laterality: Right;      Current Outpatient Medications:    albuterol (VENTOLIN HFA) 108 (90 Base) MCG/ACT inhaler, 2 puffs as needed, Disp: , Rfl:    amLODipine (NORVASC) 10 MG tablet, Take 1 tablet (10 mg total) by mouth daily., Disp: 90 tablet, Rfl: 3   B Complex Vitamins (B COMPLEX 1 PO), Take by mouth., Disp: , Rfl:    cholecalciferol (VITAMIN D) 1000 units tablet, Take 5,000 Units by mouth daily., Disp: , Rfl:    Homeopathic Products (LIVER SUPPORT) SUBL, Place under the tongue., Disp: , Rfl:    losartan (COZAAR) 100 MG tablet, Take 1 tablet (100 mg total) by mouth daily., Disp: 90 tablet, Rfl: 3   mirabegron ER (MYRBETRIQ) 25 MG TB24 tablet, Take 1 tablet (25 mg total) by mouth daily., Disp: 30 tablet, Rfl: 5   pravastatin (PRAVACHOL) 40 MG tablet, Take 1 tablet (40 mg total) by mouth  daily., Disp: 90 tablet, Rfl: 3   sertraline (ZOLOFT) 100 MG tablet, Take 1 tablet (100 mg total) by mouth daily., Disp: 90 tablet, Rfl: 3   vitamin C (ASCORBIC ACID) 500 MG tablet, Take 500 mg by mouth daily., Disp: , Rfl:    Objective:   Vitals:   11/24/21 1422  BP: (!) 151/87  Pulse: 79  SpO2: 100%    Physical Exam Constitutional:      Appearance: Normal appearance.  HENT:     Ears:   Cardiovascular:     Rate and Rhythm: Normal rate.     Pulses: Normal pulses.  Pulmonary:     Effort: Pulmonary effort is normal.  Abdominal:     General: There is no distension.     Palpations: Abdomen is soft.  Skin:    General: Skin is warm.  Neurological:     Mental Status: She is alert and oriented to person, place, and time.  Psychiatric:        Mood and Affect: Mood normal.        Behavior: Behavior normal.         Thought Content: Thought content normal.    Assessment & Plan:  Changing skin lesion  I think a biopsy would be a good idea.  I also read recommend Motrin 1 or 2 times a day to keep the inflammation down.  I strongly recommend she does not pick at it.  I asked if she will let me know the results of the biopsy.  I did not get a picture because I could not get a visualization of it.   [place], DO

## 2021-11-28 DIAGNOSIS — H0100A Unspecified blepharitis right eye, upper and lower eyelids: Secondary | ICD-10-CM | POA: Diagnosis not present

## 2021-11-28 DIAGNOSIS — H0100B Unspecified blepharitis left eye, upper and lower eyelids: Secondary | ICD-10-CM | POA: Diagnosis not present

## 2021-12-12 ENCOUNTER — Other Ambulatory Visit: Payer: Self-pay | Admitting: *Deleted

## 2021-12-15 ENCOUNTER — Inpatient Hospital Stay: Payer: Medicare Other | Attending: Hematology and Oncology

## 2021-12-15 ENCOUNTER — Other Ambulatory Visit: Payer: Self-pay

## 2021-12-15 ENCOUNTER — Inpatient Hospital Stay: Payer: Medicare Other

## 2021-12-15 DIAGNOSIS — E119 Type 2 diabetes mellitus without complications: Secondary | ICD-10-CM | POA: Diagnosis not present

## 2021-12-15 DIAGNOSIS — R7989 Other specified abnormal findings of blood chemistry: Secondary | ICD-10-CM | POA: Insufficient documentation

## 2021-12-15 DIAGNOSIS — M6281 Muscle weakness (generalized): Secondary | ICD-10-CM | POA: Insufficient documentation

## 2021-12-15 LAB — CBC WITH DIFFERENTIAL (CANCER CENTER ONLY)
Abs Immature Granulocytes: 0.01 10*3/uL (ref 0.00–0.07)
Basophils Absolute: 0 10*3/uL (ref 0.0–0.1)
Basophils Relative: 0 %
Eosinophils Absolute: 0.1 10*3/uL (ref 0.0–0.5)
Eosinophils Relative: 3 %
HCT: 40.7 % (ref 36.0–46.0)
Hemoglobin: 14.3 g/dL (ref 12.0–15.0)
Immature Granulocytes: 0 %
Lymphocytes Relative: 20 %
Lymphs Abs: 0.8 10*3/uL (ref 0.7–4.0)
MCH: 31 pg (ref 26.0–34.0)
MCHC: 35.1 g/dL (ref 30.0–36.0)
MCV: 88.1 fL (ref 80.0–100.0)
Monocytes Absolute: 0.3 10*3/uL (ref 0.1–1.0)
Monocytes Relative: 7 %
Neutro Abs: 2.7 10*3/uL (ref 1.7–7.7)
Neutrophils Relative %: 70 %
Platelet Count: 92 10*3/uL — ABNORMAL LOW (ref 150–400)
RBC: 4.62 MIL/uL (ref 3.87–5.11)
RDW: 14.7 % (ref 11.5–15.5)
WBC Count: 3.9 10*3/uL — ABNORMAL LOW (ref 4.0–10.5)
nRBC: 0 % (ref 0.0–0.2)

## 2021-12-15 LAB — IRON AND IRON BINDING CAPACITY (CC-WL,HP ONLY)
Iron: 67 ug/dL (ref 28–170)
Saturation Ratios: 16 % (ref 10.4–31.8)
TIBC: 417 ug/dL (ref 250–450)
UIBC: 350 ug/dL (ref 148–442)

## 2021-12-15 LAB — FERRITIN: Ferritin: 29 ng/mL (ref 11–307)

## 2021-12-15 NOTE — Progress Notes (Signed)
Patient Care Team: Vivi Barrack, MD as PCP - General (Family Medicine) Lavonna Monarch, MD as Consulting Physician (Dermatology)  DIAGNOSIS:    ICD-10-CM   1. Hereditary hemochromatosis ([facility])  E83.110       CHIEF COMPLIANT: Follow-up of hereditary hemochromatosis  INTERVAL HISTORY: [name] is a 71 y.o. with above-mentioned history of hereditary hemochromatosis on phlebotomies every 6 weeks. Labs on 12/15/2021 showed WBC 3.9, Hg 14.3, HCT 40.7,  platelets 92, iron saturation 16%, ferritin 29. The patient presents to the clinic today for follow-up.  She has had no new issues or concerns.  She is continuing to lose weight.  She has noticed some proximal muscle weakness.  ALLERGIES:  has No Known Allergies.  MEDICATIONS:  Current Outpatient Medications  Medication Sig Dispense Refill   albuterol (VENTOLIN HFA) 108 (90 Base) MCG/ACT inhaler 2 puffs as needed     amLODipine (NORVASC) 10 MG tablet Take 1 tablet (10 mg total) by mouth daily. 90 tablet 3   B Complex Vitamins (B COMPLEX 1 PO) Take by mouth.     cholecalciferol (VITAMIN D) 1000 units tablet Take 5,000 Units by mouth daily.     Homeopathic Products (LIVER SUPPORT) SUBL Place under the tongue.     losartan (COZAAR) 100 MG tablet Take 1 tablet (100 mg total) by mouth daily. 90 tablet 3   mirabegron ER (MYRBETRIQ) 25 MG TB24 tablet Take 1 tablet (25 mg total) by mouth daily. 30 tablet 5   pravastatin (PRAVACHOL) 40 MG tablet Take 1 tablet (40 mg total) by mouth daily. 90 tablet 3   sertraline (ZOLOFT) 100 MG tablet Take 1 tablet (100 mg total) by mouth daily. 90 tablet 3   vitamin C (ASCORBIC ACID) 500 MG tablet Take 500 mg by mouth daily.     No current facility-administered medications for this visit.    PHYSICAL EXAMINATION: ECOG PERFORMANCE STATUS: 1 - Symptomatic but completely ambulatory  Vitals:   12/16/21 1107  BP: (!) 146/67  Pulse: 79  Resp: 16  Temp: 97.6 F (36.4 C)  SpO2: 95%   Filed  Weights   12/16/21 1107  Weight: 152 lb 8 oz (69.2 kg)    LABORATORY DATA:  I have reviewed the data as listed CMP Latest Ref Rng & Units 09/12/2021 06/11/2021 04/16/2021  Glucose 70 - 99 mg/dL 139(H) 201(H) 199(H)  BUN 8 - 23 mg/dL 10 14 23   Creatinine 0.44 - 1.00 mg/dL 0.63 0.73 0.74  Sodium 135 - 145 mmol/L 142 141 141  Potassium 3.5 - 5.1 mmol/L 4.2 4.5 4.0  Chloride 98 - 111 mmol/L 108 105 101  CO2 22 - 32 mmol/L 24 26 26   Calcium 8.9 - 10.3 mg/dL 8.7(L) 9.6 9.4  Total Protein 6.5 - 8.1 g/dL 6.6 6.7 7.0  Total Bilirubin 0.3 - 1.2 mg/dL 0.6 0.9 0.9  Alkaline Phos 38 - 126 U/L 110 95 113  AST 15 - 41 U/L 41 56(H) 76(H)  ALT 0 - 44 U/L 51(H) 60(H) 93(H)    Lab Results  Component Value Date   WBC 3.9 (L) 12/15/2021   HGB 14.3 12/15/2021   HCT 40.7 12/15/2021   MCV 88.1 12/15/2021   PLT 92 (L) 12/15/2021   NEUTROABS 2.7 12/15/2021    ASSESSMENT & PLAN:  Hereditary hemochromatosis ([place]) Elevated liver function tests with elevated ferritin 08/2017 Ferritin : 918.9, iron saturation 43% 01/17/2018 ferritin 124, iron saturation 22% 04/05/2019: Ferritin 180, iron saturation 38% 02/05/2020: Ferritin 71, iron saturation 20%  01/23/2021: Ferritin 105, iron saturation 33%, AST 102, ALT 126, alkaline phosphatase 165, bilirubin 0.8 04/15/2021: Ferritin 43, iron saturation 13%, hemoglobin 15.3, platelets 118 AST 76, ALT 93, hemoglobin A1c 7.4 06/11/2021: Ferritin 20, iron saturation 32%, hemoglobin 13.6, platelets 122, AST 56, ALT 60   12/15/2021: Ferritin 29, iron saturation 10%, hemoglobin 14.3, platelets 92   Diabetes: Her A1c has improved to 7.3 (from 8.2)    Fracture right inferior pubic ramus: Limiting her activities. Weight issues: She continues to lose weight by eating healthier. Proximal muscle weakness: Instructed her to work on her muscles at [employer].  We will be switching her phlebotomy schedule to every 16 weeks I will see her back in 8 months day after the  phlebotomy   No orders of the defined types were placed in this encounter.  The patient has a good understanding of the overall plan. she agrees with it. she will call with any problems that may develop before the next visit here.  Total time spent: 20 mins including face to face time and time spent for planning, charting and coordination of care  Rulon Eisenmenger, MD, MPH 12/16/2021  I, Thana Ates, am acting as scribe for Dr. Nicholas Lose. Discussed I have reviewed the above documentation for accuracy and completeness, and I agree with the above.

## 2021-12-15 NOTE — Progress Notes (Signed)
Hillery Aldo Fakhouri presents today for phlebotomy per MD orders. 16 g. Phlebotomy kit to R antecubital. Phlebotomy procedure started at 1055 and ended at 1058. 525 grams removed. Patient observed for 30 minutes after procedure without any incident. Patient tolerated procedure well. Food & drink given. IV needle removed intact.  Patient [place] home in stable condition, VS WNL.

## 2021-12-16 ENCOUNTER — Other Ambulatory Visit: Payer: Self-pay | Admitting: *Deleted

## 2021-12-16 ENCOUNTER — Ambulatory Visit: Payer: Medicare Other | Admitting: Hematology and Oncology

## 2021-12-16 ENCOUNTER — Inpatient Hospital Stay: Payer: Medicare Other | Admitting: Hematology and Oncology

## 2021-12-16 DIAGNOSIS — R7989 Other specified abnormal findings of blood chemistry: Secondary | ICD-10-CM | POA: Diagnosis not present

## 2021-12-16 DIAGNOSIS — E119 Type 2 diabetes mellitus without complications: Secondary | ICD-10-CM | POA: Diagnosis not present

## 2021-12-16 DIAGNOSIS — M6281 Muscle weakness (generalized): Secondary | ICD-10-CM | POA: Diagnosis not present

## 2021-12-16 NOTE — Assessment & Plan Note (Signed)
Elevated liver function tests with elevated ferritin 08/2017 Ferritin : 918.9, iron saturation 43% 01/17/2018 ferritin 124, iron saturation 22% 04/05/2019:Ferritin 180, iron saturation 38% 02/05/2020: Ferritin 71, iron saturation 20% 01/23/2021: Ferritin 105, iron saturation 33%, AST 102, ALT 126, alkaline phosphatase 165, bilirubin 0.8 04/15/2021: Ferritin 43, iron saturation 13%, hemoglobin 15.3, platelets 118 AST 76, ALT 93, hemoglobin A1c 7.4 06/11/2021: Ferritin 20, iron saturation 32%, hemoglobin 13.6, platelets 122, AST 56, ALT 60   12/15/2021: Ferritin 29, iron saturation 10%, hemoglobin 14.3, platelets 92  Diabetes: Her A1c has improved to 7.3 (from 8.2)  Fracture right inferior pubic ramus: Limiting her activities. Weight issues: Patient has noticed a decrease in her weight.  We will be switching her phlebotomy schedule to every 16 weeks

## 2022-01-02 ENCOUNTER — Other Ambulatory Visit: Payer: Self-pay | Admitting: Family Medicine

## 2022-01-05 ENCOUNTER — Ambulatory Visit: Payer: Medicare Other | Admitting: Dermatology

## 2022-01-05 ENCOUNTER — Other Ambulatory Visit: Payer: Self-pay

## 2022-01-05 DIAGNOSIS — H61001 Unspecified perichondritis of right external ear: Secondary | ICD-10-CM | POA: Diagnosis not present

## 2022-01-05 DIAGNOSIS — L570 Actinic keratosis: Secondary | ICD-10-CM | POA: Diagnosis not present

## 2022-01-13 ENCOUNTER — Institutional Professional Consult (permissible substitution): Payer: Medicare Other | Admitting: Plastic Surgery

## 2022-01-17 ENCOUNTER — Encounter: Payer: Self-pay | Admitting: Dermatology

## 2022-01-17 NOTE — Progress Notes (Signed)
? ?  Follow-Up Visit ?  ?Subjective  ?[name] is a 71 y.o. female who presents for the following: Skin Problem (Pt has a spot of concern on the L cheek and in the R ear. R ear - Not throbbing but the bump keeps reoccurring. Saw another derm and pt did not return(did not favor provider). Pt was referred to plastics and plastic told her she needed a bx. Pt was told that she needed an injection and got the 1st one in Oct or Nov. Py was due for 2nd injection and this was the earliest she could get an appt for the injection. As per last DOS, pt needs bx ). ? ?Lesion on ear, other crusts ?Location:  ?Duration:  ?Quality:  ?Associated Signs/Symptoms: ?Modifying Factors:  ?Severity:  ?Timing: ?Context:  ? ?Objective  ?Well appearing patient in no apparent distress; mood and affect are within normal limits. ?Right Superior Helix ?Confusing history of being told by a dermatologist and a plastic surgeon that the spot needs a biopsy and/or repeated injection.  Clinically this focally eroded 5 mm papule certainly best fits chondrodermatitis nodularis helicis.  I discussed this diagnosis in some detail with patient.  Pending receiving information on previous treatment I will defer injecting or biopsy.  Addendum: I reviewed Dr. Lyndee Leo Billingham's note from her consultation but was unable to find anything about her examination or recommendation for the ear lesion. ? ?Left Buccal Cheek, Left Dorsal Hand ?Hornlike 3 mm pink crusts ? ? ? ?A focused examination was performed including head, neck, hands.. Relevant physical exam findings are noted in the Assessment and Plan. ? ? ?Assessment & Plan  ? ? ?Chondrodermatitis nodularis helicis of right ear ?Right Superior Helix ? ?Return next month to decide on appropriate neck step therapy. ? ?Actinic keratosis (2) ?Left Dorsal Hand; Left Buccal Cheek ? ?Destruction of lesion - Left Buccal Cheek, Left Dorsal Hand ?Complexity: simple   ?Destruction method: cryotherapy   ?Informed  consent: discussed and consent obtained   ?Timeout:  patient name, date of birth, surgical site, and procedure verified ?Lesion destroyed using liquid nitrogen: Yes   ?Cryotherapy cycles:  3 ?Outcome: patient tolerated procedure well with no complications   ?Post-procedure details: wound care instructions given   ? ? ? ? ? ?I, Lavonna Monarch, MD, have reviewed all documentation for this visit.  The documentation on 01/17/22 for the exam, diagnosis, procedures, and orders are all accurate and complete. ?

## 2022-01-21 ENCOUNTER — Ambulatory Visit: Payer: Self-pay

## 2022-01-21 ENCOUNTER — Encounter: Payer: Self-pay | Admitting: Family

## 2022-01-21 ENCOUNTER — Ambulatory Visit (INDEPENDENT_AMBULATORY_CARE_PROVIDER_SITE_OTHER): Payer: Medicare Other | Admitting: Family

## 2022-01-21 DIAGNOSIS — G8929 Other chronic pain: Secondary | ICD-10-CM

## 2022-01-21 DIAGNOSIS — M546 Pain in thoracic spine: Secondary | ICD-10-CM

## 2022-01-21 DIAGNOSIS — M545 Low back pain, unspecified: Secondary | ICD-10-CM

## 2022-01-21 DIAGNOSIS — M542 Cervicalgia: Secondary | ICD-10-CM

## 2022-01-21 MED ORDER — PREDNISONE 50 MG PO TABS: ORAL_TABLET | ORAL | 0 refills | Status: DC

## 2022-01-30 NOTE — Progress Notes (Signed)
Office Visit Note   Patient: [name]           Date of Birth: [birth year]           MRN: [id number] Visit Date: 01/21/2022              Requested by: Vivi Barrack, [address] [place],  [place] [address] PCP: Vivi Barrack, MD  Chief Complaint  Patient presents with   Lower Back - Pain   Middle Back - Pain   Neck - Pain      HPI: The patient is a 71 year old woman who presents today complaining of neck and low back pain.  She does have a history of upper back pain as well.  She has had significant new right-sided neck pain since last Tuesday when she initially felt that she just slipped left wrong unfortunately the pain did not subside and has gradually worsened she is having pain from her neck that radiates down her arm she is having tingling in her hand she denies any weakness associated with this has not been dropping things.  She does also have shooting pain from her back that goes down her buttock area it feels like a twist.  She if she bends or twist this aggravates the pain in her low back  She is right-hand dominant She has not had any recent falls or injuries  She does have a history of radiculopathy   Assessment & Plan: Visit Diagnoses:  1. Neck pain   2. Pain in thoracic spine   3. Chronic midline low back pain without sciatica     Plan: Prednisone burst for her radiculopathy.  she will call or return in the next 3 to 4 weeks if she fails to improve.  Follow-Up Instructions: Return in about 3 weeks (around 02/11/2022), or if symptoms worsen or fail to improve.   Back Exam   Muscle Strength  The patient has normal back strength.   Right Hand Exam   Muscle Strength  Grip: 5/5    Left Hand Exam   Muscle Strength  The patient has normal left wrist strength. Grip:  5/5    Left Shoulder Exam  Left shoulder exam is normal.       Patient is alert, oriented, no adenopathy, well-dressed, normal affect, normal respiratory  effort.   Imaging: No results found. No images are attached to the encounter.  Labs: Lab Results  Component Value Date   HGBA1C 6.5 (H) 09/12/2021   HGBA1C 7.3 (H) 06/11/2021   HGBA1C 7.4 (H) 04/16/2021   ESRSEDRATE 101 (H) 06/06/2011   ESRSEDRATE 49 (H) 06/04/2011   LABURIC 3.7 06/04/2011   REPTSTATUS 07/02/2013 FINAL 07/01/2013   GRAMSTAIN  06/06/2011    NO ORGANISMS SEEN RARE WBC PRESENT, PREDOMINANTLY PMN Gram Stain Report Called to,Read Back By and Verified With: DR. Veverly Fells AT 1912 ON [id number] BY LOVE,T.   CULT NO GROWTH Performed at [facility] 07/01/2013   [place] 06/06/2011     Lab Results  Component Value Date   ALBUMIN 4.0 09/12/2021   ALBUMIN 4.3 06/11/2021   ALBUMIN 4.2 04/16/2021    No results found for: MG No results found for: VD25OH  No results found for: PREALBUMIN    Latest Ref Rng & Units 12/15/2021   10:20 AM 09/12/2021   10:22 AM 06/11/2021   12:46 PM  CBC EXTENDED  WBC 4.0 - 10.5 K/uL 3.9   3.5   4.8  RBC 3.87 - 5.11 MIL/uL 4.62   4.51   4.39    Hemoglobin 12.0 - 15.0 g/dL 14.3   12.9   13.6    HCT 36.0 - 46.0 % 40.7   38.6   39.6    Platelets 150 - 400 K/uL 92   91   122    NEUT# 1.7 - 7.7 K/uL 2.7   2.3   3.1    Lymph# 0.7 - 4.0 K/uL 0.8   0.6   0.8       There is no height or weight on file to calculate BMI.  Orders:  Orders Placed This Encounter  Procedures   XR Cervical Spine 2 or 3 views   XR Thoracic Spine 2 View   XR Lumbar Spine 2-3 Views   Meds ordered this encounter  Medications   predniSONE (DELTASONE) 50 MG tablet    Sig: Take one tablet by mouth once daily for 5 days.    Dispense:  5 tablet    Refill:  0     Procedures: No procedures performed  Clinical Data: No additional findings.  ROS:  All other systems negative, except as noted in the HPI. Review of Systems  Constitutional:  Negative for chills and fever.  Musculoskeletal:  Positive for back pain and myalgias.     Objective: Vital Signs: There were no vitals taken for this visit.  Specialty Comments:  No specialty comments available.  PMFS History: Patient Active Problem List   Diagnosis Date Noted   Changing skin lesion 11/24/2021   Hypertension associated with diabetes ([place]) 05/28/2021   Former smoker 05/28/2021   Dyslipidemia associated with type 2 diabetes mellitus ([facility]) 05/28/2021   COPD (chronic obstructive pulmonary disease) ([facility]) 05/28/2021   Cataract 05/28/2021   T2DM (type 2 diabetes mellitus) ([facility]) 05/28/2021   Depression, major, single episode, complete remission ([place]) 05/28/2021   Anxiety 05/28/2021   B12 deficiency 05/28/2021   Urge incontinence 05/28/2021   Hereditary hemochromatosis ([place]) 11/03/2017   Past Medical History:  Diagnosis Date   Allergy    Anxiety    OCC   Arthritis    COPD (chronic obstructive pulmonary disease) ([place])    Diabetes mellitus without complication ([facility])    no meds   GERD (gastroesophageal reflux disease)    OCC   Hereditary hemochromatosis ([facility])    Hyperlipidemia    Hypertension    Vitamin D deficiency 08/2017    Family History  Problem Relation Age of Onset   Breast cancer Neg Hx    Colon polyps Neg Hx    Colon cancer Neg Hx    Esophageal cancer Neg Hx    Rectal cancer Neg Hx    Stomach cancer Neg Hx     Past Surgical History:  Procedure Laterality Date   COLONOSCOPY     10 + years ago   [place] fx surgery   GANGLION CYST EXCISION  2012   post surgery infection   I & D EXTREMITY Right 07/01/2013   Procedure: IRRIGATION AND DEBRIDEMENT RIGHT LEG;  Surgeon: Newt Minion, MD;  Location: [facility];  Service: Orthopedics;  Laterality: Right;   LAPAROSCOPIC HYSTERECTOMY     ORIF FEMUR FRACTURE Right 07/01/2013   Procedure: OPEN REDUCTION INTERNAL FIXATION (ORIF) DISTAL FEMUR FRACTURE;  Surgeon: Newt Minion, MD;  Location: [facility];  Service: Orthopedics;  Laterality: Right;   Social History    Occupational History   Not on file  Tobacco Use   Smoking status: Former    Packs/day: 1.00    Years: 44.00    Pack years: 44.00    Types: Cigarettes   Smokeless tobacco: Never  Substance and Sexual Activity   Alcohol use: Yes    Alcohol/week: 2.0 standard drinks    Types: 2 Glasses of wine per week   Drug use: Never   Sexual activity: Not on file

## 2022-02-05 ENCOUNTER — Ambulatory Visit (INDEPENDENT_AMBULATORY_CARE_PROVIDER_SITE_OTHER): Payer: Medicare Other | Admitting: Orthopedic Surgery

## 2022-02-05 DIAGNOSIS — M542 Cervicalgia: Secondary | ICD-10-CM | POA: Diagnosis not present

## 2022-02-11 ENCOUNTER — Encounter: Payer: Self-pay | Admitting: Orthopedic Surgery

## 2022-02-11 NOTE — Progress Notes (Signed)
? ?Office Visit Note ?  ?Patient: [name]           ?Date of Birth: 1951/06/17           ?MRN: [id number] ?Visit Date: 02/05/2022 ?             ?Requested by: Vivi Barrack, MD ?[address] ?[place],  [place] [address] ?PCP: Vivi Barrack, MD ? ?Chief Complaint  ?Patient presents with  ? Neck - Pain  ? Lower Back - Pain  ? ? ? ? ?HPI: ?Patient is a 71 year old woman who presents in follow-up for neck and lumbar spine pain.  She is also had right shoulder pain as well.  She is status post an injection in her shoulder about 3 weeks ago.  She states the injection lasted for about 2 days.  Patient states she is also taken some prednisone without change she has also used heat.  Patient states her right shoulder pain radiates down her arm. ? ?Patient states she helped a friend move about 3 weeks ago and felt acute pain in her neck that radiated down to the right hand. ? ?Assessment & Plan: ?Visit Diagnoses:  ?1. Neck pain   ? ? ?Plan: We will set her up for physical therapy for the cervical spine. ? ?Follow-Up Instructions: Return in about 4 weeks (around 03/05/2022).  ? ?Ortho Exam ? ?Patient is alert, oriented, no adenopathy, well-dressed, normal affect, normal respiratory effort. ?Examination radiographs of the cervical spine do show some bone spurs.  Patient has full range of motion of both shoulders no rotator cuff pathology at this time.  Patient has right upper extremity radicular symptoms thoracic outlet is nontender to palpation there is no focal motor weakness. ? ?Imaging: ?No results found. ?No images are attached to the encounter. ? ?Labs: ?Lab Results  ?Component Value Date  ? HGBA1C 6.5 (H) 09/12/2021  ? HGBA1C 7.3 (H) 06/11/2021  ? HGBA1C 7.4 (H) 04/16/2021  ? ESRSEDRATE 101 (H) 06/06/2011  ? ESRSEDRATE 49 (H) 06/04/2011  ? LABURIC 3.7 06/04/2011  ? REPTSTATUS 07/02/2013 FINAL 07/01/2013  ? GRAMSTAIN  06/06/2011  ?  NO ORGANISMS SEEN ?RARE WBC PRESENT, PREDOMINANTLY PMN ?Gram Stain Report Called  to,Read Back By and Verified With: DR. Veverly Fells AT [place] [id number] BY LOVE,T.  ? CULT NO GROWTH ?Performed at [facility] 07/01/2013  ? [place] STAPHYLOCOCCUS AUREUS 06/06/2011  ? ? ? ?Lab Results  ?Component Value Date  ? ALBUMIN 4.0 09/12/2021  ? ALBUMIN 4.3 06/11/2021  ? ALBUMIN 4.2 04/16/2021  ? ? ?No results found for: MG ?No results found for: VD25OH ? ?No results found for: PREALBUMIN ? ?  Latest Ref Rng & Units 12/15/2021  ? 10:20 AM 09/12/2021  ? 10:22 AM 06/11/2021  ? 12:46 PM  ?CBC EXTENDED  ?WBC 4.0 - 10.5 K/uL 3.9   3.5   4.8    ?RBC 3.87 - 5.11 MIL/uL 4.62   4.51   4.39    ?Hemoglobin 12.0 - 15.0 g/dL 14.3   12.9   13.6    ?HCT 36.0 - 46.0 % 40.7   38.6   39.6    ?Platelets 150 - 400 K/uL 92   91   122    ?NEUT# 1.7 - 7.7 K/uL 2.7   2.3   3.1    ?Lymph# 0.7 - 4.0 K/uL 0.8   0.6   0.8    ? ? ? ?There is no height or weight on file to calculate  BMI. ? ?Orders:  ?No orders of the defined types were placed in this encounter. ? ?No orders of the defined types were placed in this encounter. ? ? ? Procedures: ?No procedures performed ? ?Clinical Data: ?No additional findings. ? ?ROS: ? ?All other systems negative, except as noted in the HPI. ?Review of Systems ? ?Objective: ?Vital Signs: There were no vitals taken for this visit. ? ?Specialty Comments:  ?No specialty comments available. ? ?PMFS History: ?Patient Active Problem List  ? Diagnosis Date Noted  ? Changing skin lesion 11/24/2021  ? Hypertension associated with diabetes ([place]) 05/28/2021  ? Former smoker 05/28/2021  ? Dyslipidemia associated with type 2 diabetes mellitus ([place]) 05/28/2021  ? COPD (chronic obstructive pulmonary disease) ([place]) 05/28/2021  ? Cataract 05/28/2021  ? T2DM (type 2 diabetes mellitus) ([place]) 05/28/2021  ? Depression, major, single episode, complete remission ([place]) 05/28/2021  ? Anxiety 05/28/2021  ? B12 deficiency 05/28/2021  ? Urge incontinence 05/28/2021  ? Hereditary hemochromatosis ([place]) 11/03/2017  ? ?Past Medical  History:  ?Diagnosis Date  ? Allergy   ? Anxiety   ? OCC  ? Arthritis   ? COPD (chronic obstructive pulmonary disease) ([place])   ? Diabetes mellitus without complication ([place])   ? no meds  ? GERD (gastroesophageal reflux disease)   ? OCC  ? Hereditary hemochromatosis ([place])   ? Hyperlipidemia   ? Hypertension   ? Vitamin D deficiency 08/2017  ?  ?Family History  ?Problem Relation Age of Onset  ? Breast cancer Neg Hx   ? Colon polyps Neg Hx   ? Colon cancer Neg Hx   ? Esophageal cancer Neg Hx   ? Rectal cancer Neg Hx   ? Stomach cancer Neg Hx   ?  ?Past Surgical History:  ?Procedure Laterality Date  ? COLONOSCOPY    ? 10 + years ago  ? [place]  ? tibia/ Fiblia fx surgery  ? GANGLION CYST EXCISION  2012  ? post surgery infection  ? I & D EXTREMITY Right 07/01/2013  ? Procedure: IRRIGATION AND DEBRIDEMENT RIGHT LEG;  Surgeon: Newt Minion, MD;  Location: [facility];  Service: Orthopedics;  Laterality: Right;  ? LAPAROSCOPIC HYSTERECTOMY    ? ORIF FEMUR FRACTURE Right 07/01/2013  ? Procedure: OPEN REDUCTION INTERNAL FIXATION (ORIF) DISTAL FEMUR FRACTURE;  Surgeon: Newt Minion, MD;  Location: [facility];  Service: Orthopedics;  Laterality: Right;  ? ?Social History  ? ?Occupational History  ? Not on file  ?Tobacco Use  ? Smoking status: Former  ?  Packs/day: 1.00  ?  Years: 44.00  ?  Pack years: 44.00  ?  Types: Cigarettes  ? Smokeless tobacco: Never  ?Substance and Sexual Activity  ? Alcohol use: Yes  ?  Alcohol/week: 2.0 standard drinks  ?  Types: 2 Glasses of wine per week  ? Drug use: Never  ? Sexual activity: Not on file  ? ? ? ? ? ?

## 2022-02-16 ENCOUNTER — Telehealth: Payer: Self-pay | Admitting: Orthopedic Surgery

## 2022-02-16 ENCOUNTER — Other Ambulatory Visit: Payer: Self-pay | Admitting: Orthopedic Surgery

## 2022-02-16 DIAGNOSIS — M542 Cervicalgia: Secondary | ICD-10-CM

## 2022-02-16 NOTE — Telephone Encounter (Signed)
Referred upstairs at our PT dept. Pt informed. ?

## 2022-02-16 NOTE — Telephone Encounter (Signed)
Patient called. She would like a referral for PT to come here. Her call back number is [phone] ?

## 2022-02-25 ENCOUNTER — Encounter: Payer: Self-pay | Admitting: Rehabilitative and Restorative Service Providers"

## 2022-02-25 ENCOUNTER — Ambulatory Visit (INDEPENDENT_AMBULATORY_CARE_PROVIDER_SITE_OTHER): Payer: Medicare Other | Admitting: Rehabilitative and Restorative Service Providers"

## 2022-02-25 DIAGNOSIS — R293 Abnormal posture: Secondary | ICD-10-CM | POA: Diagnosis not present

## 2022-02-25 DIAGNOSIS — M5412 Radiculopathy, cervical region: Secondary | ICD-10-CM

## 2022-02-25 DIAGNOSIS — M6281 Muscle weakness (generalized): Secondary | ICD-10-CM

## 2022-02-25 NOTE — Therapy (Signed)
?OUTPATIENT PHYSICAL THERAPY CERVICAL EVALUATION ? ? ?Patient Name: [name] [facility] ?MRN: [id number] ?DOB:1951-01-16, 71 y.o., female ?Today's Date: 02/25/2022 ? ? PT End of Session - 02/25/22 1701   ? ? Visit Number 1   ? Number of Visits 10   ? Date for PT Re-Evaluation 04/08/22   ? Progress Note Due on Visit 10   ? PT Start Time [phone]   ? PT Stop Time 0930   ? PT Time Calculation (min) 32 min   ? Activity Tolerance Patient tolerated treatment well   ? Behavior During Therapy [facility] for tasks assessed/performed   ? ?  ?  ? ?  ? ? ?Past Medical History:  ?Diagnosis Date  ? Allergy   ? Anxiety   ? OCC  ? Arthritis   ? COPD (chronic obstructive pulmonary disease) ([place])   ? Diabetes mellitus without complication ([place])   ? no meds  ? GERD (gastroesophageal reflux disease)   ? OCC  ? Hereditary hemochromatosis ([place])   ? Hyperlipidemia   ? Hypertension   ? Vitamin D deficiency 08/2017  ? ?Past Surgical History:  ?Procedure Laterality Date  ? COLONOSCOPY    ? 10 + years ago  ? [place]  ? tibia/ Fiblia fx surgery  ? GANGLION CYST EXCISION  2012  ? post surgery infection  ? I & D EXTREMITY Right 07/01/2013  ? Procedure: IRRIGATION AND DEBRIDEMENT RIGHT LEG;  Surgeon: Newt Minion, MD;  Location: [facility];  Service: Orthopedics;  Laterality: Right;  ? LAPAROSCOPIC HYSTERECTOMY    ? ORIF FEMUR FRACTURE Right 07/01/2013  ? Procedure: OPEN REDUCTION INTERNAL FIXATION (ORIF) DISTAL FEMUR FRACTURE;  Surgeon: Newt Minion, MD;  Location: [facility];  Service: Orthopedics;  Laterality: Right;  ? ?Patient Active Problem List  ? Diagnosis Date Noted  ? Changing skin lesion 11/24/2021  ? Hypertension associated with diabetes ([place]) 05/28/2021  ? Former smoker 05/28/2021  ? Dyslipidemia associated with type 2 diabetes mellitus ([place]) 05/28/2021  ? COPD (chronic obstructive pulmonary disease) ([place]) 05/28/2021  ? Cataract 05/28/2021  ? T2DM (type 2 diabetes mellitus) ([place]) 05/28/2021  ? Depression, major, single episode, complete  remission ([place]) 05/28/2021  ? Anxiety 05/28/2021  ? B12 deficiency 05/28/2021  ? Urge incontinence 05/28/2021  ? Hereditary hemochromatosis ([place]) 11/03/2017  ? ? ?PCP: Vivi Barrack, MD ? ?REFERRING PROVIDER: Newt Minion, MD ? ?REFERRING DIAG: M54.2 (ICD-10-CM) - Neck pain ? ?THERAPY DIAG:  ?Abnormal posture - Plan: PT plan of care cert/re-cert ? ?Radiculopathy, cervical region - Plan: PT plan of care cert/re-cert ? ?Muscle weakness (generalized) - Plan: PT plan of care cert/re-cert ? ?ONSET DATE: About 5 weeks ago ? ?SUBJECTIVE:                                                                                                                                                                                                        ? ?  SUBJECTIVE STATEMENT: ?5 weeks ago, [name] helped a friend move in [place].  From packing and lifting, she had pain down into the R hand.  Symptoms are now in the R scapula. ? ?PERTINENT HISTORY:  ?Arthritis, COPD, DM, HTN, previous R tib/fib and femur fracture, former smoker ? ?PAIN:  ?Are you having pain? Yes: NPRS scale: 3-7/10 ?Pain location: Neck, upper trapezius and R scapula ?Pain description: Burning, tight ?Aggravating factors: Flexion and slouched postures ?Relieving factors: Postural correction ? ?PRECAUTIONS: Other: Cervical ? ?WEIGHT BEARING RESTRICTIONS No ? ?FALLS:  ?Has patient fallen in last 6 months? No ? ?LIVING ENVIRONMENT: ?Lives with: lives with their family ?Lives in: House/apartment ?Stairs:  OK with stairs ?Has following equipment at home: None ? ?OCCUPATION: Retired ? ?PLOF: Independent ? ?PATIENT GOALS Return to working in the yard, tennis.  Return to sleep without pain. ? ?OBJECTIVE:  ? ?DIAGNOSTIC FINDINGS:  ?Could not pull up in Epic ? ?PATIENT SURVEYS:  ?FOTO 53 (Goal 66 in 10 visits) ? ? ?COGNITION: ?Overall cognitive status: Within functional limits for tasks assessed ? ? ?SENSATION: ?Not tested ? ?POSTURE:  ?Rounded shoulders are most evident ?  ? ?CERVICAL  ROM:  ? ?Active ROM A/PROM (deg) ?02/25/2022  ?Flexion   ?Extension 70  ?Right lateral flexion 30  ?Left lateral flexion 25  ?Right rotation 70  ?Left rotation 70  ? (Blank rows = not tested) ? ?Strength: ? ?In pounds with hand held dynamometer Right ?02/25/2022 Left ?02/25/2022  ?Shoulder flexion    ?Cervical extension 6.7   ?Cervical lateral bending 7.4 9.9   ?Shoulder adduction    ?Shoulder extension    ?Shoulder internal rotation    ?Shoulder external rotation    ?Middle trapezius    ?Lower trapezius    ?Elbow flexion    ?Elbow extension    ?Wrist flexion    ?Wrist extension    ?Wrist ulnar deviation    ?Wrist radial deviation    ?Wrist pronation    ?Wrist supination    ?Grip strength    ? (Blank rows = not tested) ? ? ?TODAY'S TREATMENT:  ? ?02/25/2022 ?Therapeutic Activities: Review exam findings, spine anatomy education with model, lumbar roll use, review day 1 HEP ? ?Therapeutic Exercises: See HEP (verbal review) ? ? ? ?PATIENT EDUCATION:  ?Education details: See above ?Person educated: Patient ?Education method: Explanation, Demonstration, Verbal cues, and Handouts ?Education comprehension: verbalized understanding and needs further education ? ? ?HOME EXERCISE PROGRAM: ?Access Code: [email or web address] ?URL: [email or web address] ?Date: 02/25/2022 ?Prepared by: Vista Mink ? ?Exercises ?- Standing Scapular Retraction  - 5 x daily - 7 x weekly - 1 sets - 5 reps - 5 second hold ?- Standing Isometric Cervical Extension with Manual Resistance  - 5 x daily - 7 x weekly - 1 sets - 5 reps - 5 hold ? ? ?ASSESSMENT: ? ?CLINICAL IMPRESSION: ?Patient is a 71 y.o. female who was seen today for physical therapy evaluation and treatment for cervical radiculopathy.  ? ? ?OBJECTIVE IMPAIRMENTS decreased activity tolerance, decreased endurance, decreased knowledge of condition, decreased strength, decreased safety awareness, increased edema, impaired perceived functional ability, increased muscle spasms, improper body  mechanics, postural dysfunction, and pain.  ? ?ACTIVITY LIMITATIONS  Affecting all ADLs .  ? ?PERSONAL FACTORS Arthritis, COPD, DM, HTN, previous R tib/fib and femur fracture, former smoker are also affecting patient's functional outcome.  ? ? ?REHAB POTENTIAL: Good ? ?CLINICAL DECISION MAKING: Stable/uncomplicated ? ?EVALUATION COMPLEXITY: Low ? ? ?GOALS: ?Goals reviewed with patient? Yes ? ?SHORT  TERM GOALS: Target date: 03/25/2022  (Remove Blue Hyperlink) ? ?[name] will report no R UE symptoms in at least 3 consecutive days. ?Baseline: Can be to the R hand ?Goal status: INITIAL ? ? ?LONG TERM GOALS: Target date: 04/08/2022 (Remove Blue Hyperlink) ? ?Improve neck, R upper trapezius and R scapular pain to 0-2/10 on the VAS. ?Baseline: Can be 7/10 ?Goal status: INITIAL ? ?2.  Improve FOTO to 66. ?Baseline: 53 ?Goal status: INITIAL ? ?3.  Improve cervical extension strength to 30 pounds and lateral bending to 18 pounds. ?Baseline: All < 10 pounds ?Goal status: INITIAL ? ?4.  [name] will be independent with her long-term HEP at [place]. ?Baseline: Started 02/25/2022 ?Goal status: INITIAL ? ? ? ?PLAN: ?PT FREQUENCY: 1-2x/week ? ?PT DURATION: 6 weeks ? ?PLANNED INTERVENTIONS: Therapeutic exercises, Therapeutic activity, Neuromuscular re-education, Patient/Family education, Joint mobilization, Dry Needling, Electrical stimulation, Cryotherapy, Traction, and Manual therapy ? ?PLAN FOR NEXT SESSION: Review HEP, practical postural and body mechanics work.  Theraband rows, shoulder ER with resistance, cervical strengthening.  Static cervical traction (5-8 minutes) up to 25# (minimum for measurable joint separation).  DN if time.  Ice if appropriate. ? ? ?Farley Ly, PT, MPT ?02/25/2022, 5:20 PM ? ? ? ?  ?

## 2022-03-05 ENCOUNTER — Encounter: Payer: Self-pay | Admitting: Orthopedic Surgery

## 2022-03-05 ENCOUNTER — Ambulatory Visit: Payer: Medicare Other | Admitting: Orthopedic Surgery

## 2022-03-05 DIAGNOSIS — M542 Cervicalgia: Secondary | ICD-10-CM

## 2022-03-05 NOTE — Progress Notes (Signed)
Office Visit Note   Patient: [name]           Date of Birth: [birth year]           MRN: [id number] Visit Date: 03/05/2022              Requested by: Vivi Barrack, MD [address] [place],  [place] [address] PCP: Vivi Barrack, MD  Chief Complaint  Patient presents with   Neck - Follow-up      HPI: Patient is a 71 year old woman presents with persistent cervical spine pain with right upper extremity radicular symptoms.  Patient states she has radicular pain to the medial scapular border on the right pain with numbness going into her fingers.  Decreased grip strength.  Patient complains of a bump on the top of her shoulder.  Assessment & Plan: Visit Diagnoses:  1. Neck pain     Plan: Patient will resume physical therapy discussed that the bump in her shoulder is arthropathy of the [facility] joint.  Patient has had no relief with prednisone or nonsteroidals.  Has tried both heat and ice.  Follow-Up Instructions: Return in about 4 weeks (around 04/02/2022).   Ortho Exam  Patient is alert, oriented, no adenopathy, well-dressed, normal affect, normal respiratory effort. Examination patient has good range of motion of her cervical spine.  Pain radiating to the medial scapular border on the right no focal motor weakness at this time.  She does have a palpable bony spur at the [facility] joint right shoulder consistent with AC arthritis.  Imaging: No results found. No images are attached to the encounter.  Labs: Lab Results  Component Value Date   HGBA1C 6.5 (H) 09/12/2021   HGBA1C 7.3 (H) 06/11/2021   HGBA1C 7.4 (H) 04/16/2021   ESRSEDRATE 101 (H) 06/06/2011   ESRSEDRATE 49 (H) 06/04/2011   LABURIC 3.7 06/04/2011   REPTSTATUS 07/02/2013 FINAL 07/01/2013   GRAMSTAIN  06/06/2011    NO ORGANISMS SEEN RARE WBC PRESENT, PREDOMINANTLY PMN Gram Stain Report Called to,Read Back By and Verified With: DR. Veverly Fells AT 1912 ON [id number] BY LOVE,T.   CULT NO GROWTH Performed at [facility] 07/01/2013   [place] STAPHYLOCOCCUS AUREUS 06/06/2011     Lab Results  Component Value Date   ALBUMIN 4.0 09/12/2021   ALBUMIN 4.3 06/11/2021   ALBUMIN 4.2 04/16/2021    No results found for: MG No results found for: VD25OH  No results found for: PREALBUMIN    Latest Ref Rng & Units 12/15/2021   10:20 AM 09/12/2021   10:22 AM 06/11/2021   12:46 PM  CBC EXTENDED  WBC 4.0 - 10.5 K/uL 3.9   3.5   4.8    RBC 3.87 - 5.11 MIL/uL 4.62   4.51   4.39    Hemoglobin 12.0 - 15.0 g/dL 14.3   12.9   13.6    HCT 36.0 - 46.0 % 40.7   38.6   39.6    Platelets 150 - 400 K/uL 92   91   122    NEUT# 1.7 - 7.7 K/uL 2.7   2.3   3.1    Lymph# 0.7 - 4.0 K/uL 0.8   0.6   0.8       There is no height or weight on file to calculate BMI.  Orders:  No orders of the defined types were placed in this encounter.  No orders of the defined types were placed in this encounter.    Procedures:  No procedures performed  Clinical Data: No additional findings.  ROS:  All other systems negative, except as noted in the HPI. Review of Systems  Objective: Vital Signs: There were no vitals taken for this visit.  Specialty Comments:  No specialty comments available.  PMFS History: Patient Active Problem List   Diagnosis Date Noted   Changing skin lesion 11/24/2021   Hypertension associated with diabetes ([place]) 05/28/2021   Former smoker 05/28/2021   Dyslipidemia associated with type 2 diabetes mellitus ([place]) 05/28/2021   COPD (chronic obstructive pulmonary disease) ([place]) 05/28/2021   Cataract 05/28/2021   T2DM (type 2 diabetes mellitus) ([place]) 05/28/2021   Depression, major, single episode, complete remission ([place]) 05/28/2021   Anxiety 05/28/2021   B12 deficiency 05/28/2021   Urge incontinence 05/28/2021   Hereditary hemochromatosis ([employer]) 11/03/2017   Past Medical History:  Diagnosis Date   Allergy    Anxiety    OCC   Arthritis    COPD (chronic obstructive pulmonary disease)  ([place])    Diabetes mellitus without complication ([place])    no meds   GERD (gastroesophageal reflux disease)    OCC   Hereditary hemochromatosis ([place])    Hyperlipidemia    Hypertension    Vitamin D deficiency 08/2017    Family History  Problem Relation Age of Onset   Breast cancer Neg Hx    Colon polyps Neg Hx    Colon cancer Neg Hx    Esophageal cancer Neg Hx    Rectal cancer Neg Hx    Stomach cancer Neg Hx     Past Surgical History:  Procedure Laterality Date   COLONOSCOPY     10 + years ago   [place] fx surgery   GANGLION CYST EXCISION  2012   post surgery infection   I & D EXTREMITY Right 07/01/2013   Procedure: IRRIGATION AND DEBRIDEMENT RIGHT LEG;  Surgeon: Newt Minion, MD;  Location: [facility];  Service: Orthopedics;  Laterality: Right;   LAPAROSCOPIC HYSTERECTOMY     ORIF FEMUR FRACTURE Right 07/01/2013   Procedure: OPEN REDUCTION INTERNAL FIXATION (ORIF) DISTAL FEMUR FRACTURE;  Surgeon: Newt Minion, MD;  Location: [facility];  Service: Orthopedics;  Laterality: Right;   Social History   Occupational History   Not on file  Tobacco Use   Smoking status: Former    Packs/day: 1.00    Years: 44.00    Pack years: 44.00    Types: Cigarettes   Smokeless tobacco: Never  Substance and Sexual Activity   Alcohol use: Yes    Alcohol/week: 2.0 standard drinks    Types: 2 Glasses of wine per week   Drug use: Never   Sexual activity: Not on file

## 2022-03-09 ENCOUNTER — Encounter: Payer: Self-pay | Admitting: Physical Therapy

## 2022-03-09 ENCOUNTER — Ambulatory Visit (INDEPENDENT_AMBULATORY_CARE_PROVIDER_SITE_OTHER): Payer: Medicare Other | Admitting: Physical Therapy

## 2022-03-09 DIAGNOSIS — R293 Abnormal posture: Secondary | ICD-10-CM | POA: Diagnosis not present

## 2022-03-09 DIAGNOSIS — G8929 Other chronic pain: Secondary | ICD-10-CM

## 2022-03-09 DIAGNOSIS — R2689 Other abnormalities of gait and mobility: Secondary | ICD-10-CM

## 2022-03-09 DIAGNOSIS — M6281 Muscle weakness (generalized): Secondary | ICD-10-CM | POA: Diagnosis not present

## 2022-03-09 DIAGNOSIS — M5412 Radiculopathy, cervical region: Secondary | ICD-10-CM | POA: Diagnosis not present

## 2022-03-09 DIAGNOSIS — M545 Low back pain, unspecified: Secondary | ICD-10-CM

## 2022-03-09 NOTE — Therapy (Signed)
OUTPATIENT PHYSICAL THERAPY treatment   Patient Name: [name] MRN: [id number] DOB:1951-10-15, 71 y.o., female Today's Date: 03/09/2022   PT End of Session - 03/09/22 1221     Visit Number 2    Number of Visits 10    Date for PT Re-Evaluation 04/08/22    Progress Note Due on Visit 10    PT Start Time 1751    PT Stop Time 1225    PT Time Calculation (min) 40 min    Activity Tolerance Patient tolerated treatment well    Behavior During Therapy [facility] for tasks assessed/performed             Past Medical History:  Diagnosis Date   Allergy    Anxiety    OCC   Arthritis    COPD (chronic obstructive pulmonary disease) ([place])    Diabetes mellitus without complication ([place])    no meds   GERD (gastroesophageal reflux disease)    OCC   Hereditary hemochromatosis ([place])    Hyperlipidemia    Hypertension    Vitamin D deficiency 08/2017   Past Surgical History:  Procedure Laterality Date   COLONOSCOPY     10 + years ago   [place] fx surgery   GANGLION CYST EXCISION  2012   post surgery infection   I & D EXTREMITY Right 07/01/2013   Procedure: IRRIGATION AND DEBRIDEMENT RIGHT LEG;  Surgeon: Newt Minion, MD;  Location: [place];  Service: Orthopedics;  Laterality: Right;   LAPAROSCOPIC HYSTERECTOMY     ORIF FEMUR FRACTURE Right 07/01/2013   Procedure: OPEN REDUCTION INTERNAL FIXATION (ORIF) DISTAL FEMUR FRACTURE;  Surgeon: Newt Minion, MD;  Location: [place];  Service: Orthopedics;  Laterality: Right;   Patient Active Problem List   Diagnosis Date Noted   Changing skin lesion 11/24/2021   Hypertension associated with diabetes ([place]) 05/28/2021   Former smoker 05/28/2021   Dyslipidemia associated with type 2 diabetes mellitus ([place]) 05/28/2021   COPD (chronic obstructive pulmonary disease) ([place]) 05/28/2021   Cataract 05/28/2021   T2DM (type 2 diabetes mellitus) ([place]) 05/28/2021   Depression, major, single episode, complete remission  ([place]) 05/28/2021   Anxiety 05/28/2021   B12 deficiency 05/28/2021   Urge incontinence 05/28/2021   Hereditary hemochromatosis ([place]) 11/03/2017    PCP: Vivi Barrack, MD  REFERRING PROVIDER: Newt Minion, MD  REFERRING DIAG: M54.2 (ICD-10-CM) - Neck pain  THERAPY DIAG:  Abnormal posture  Radiculopathy, cervical region  Muscle weakness (generalized)  Chronic midline low back pain without sciatica  Other abnormalities of gait and mobility  ONSET DATE: About 5 weeks ago  SUBJECTIVE:  SUBJECTIVE STATEMENT: Relays the N/T is in her middle 3 fingers is the worse.   PERTINENT HISTORY:  Arthritis, COPD, DM, HTN, previous R tib/fib and femur fracture, former smoker  PAIN:  Are you having pain? Yes: NPRS scale: 3-7/10 Pain location: Neck, upper trapezius and R scapula Pain description: Burning, tight Aggravating factors: Flexion and slouched postures Relieving factors: Postural correction  PRECAUTIONS: Other: Cervical  WEIGHT BEARING RESTRICTIONS No  FALLS:  Has patient fallen in last 6 months? No  LIVING ENVIRONMENT: Lives with: lives with their family Lives in: House/apartment Stairs:  OK with stairs Has following equipment at home: None  OCCUPATION: Retired  PLOF: Independent  PATIENT GOALS Return to working in the yard, tennis.  Return to sleep without pain.  OBJECTIVE:   DIAGNOSTIC FINDINGS:  Could not pull up in Epic  PATIENT SURVEYS:  FOTO 53 (Goal 66 in 10 visits)   COGNITION: Overall cognitive status: Within functional limits for tasks assessed   SENSATION: Not tested  POSTURE:  Rounded shoulders are most evident    CERVICAL ROM:   Active ROM A/PROM (deg) 02/25/22  Flexion   Extension 70  Right lateral flexion 30  Left lateral flexion  25  Right rotation 70  Left rotation 70   (Blank rows = not tested)  Strength:  In pounds with hand held dynamometer Right 02/25/22 Left 02/25/22  Shoulder flexion    Cervical extension 6.7   Cervical lateral bending 7.4 9.9   Shoulder adduction    Shoulder extension    Shoulder internal rotation    Shoulder external rotation    Middle trapezius    Lower trapezius    Elbow flexion    Elbow extension    Wrist flexion    Wrist extension    Wrist ulnar deviation    Wrist radial deviation    Wrist pronation    Wrist supination    Grip strength     (Blank rows = not tested)   TODAY'S TREATMENT:   03/09/22  Rows green  X20  Shoulder extensions green X 20  Bilat shoulder ER green 2X10  Chin tucks/cervical retraction 2X10  Rt UE median nerve glides 2X10  Standing bilat shoulder flexion ball rolls up wall stretching into thoracic extension 5 sec X 10  Cervical mechanical traction 20-15# intermittent X 15 min    PATIENT EDUCATION:  Education details: See above Person educated: Patient Education method: Consulting civil engineer, Media planner, Verbal cues, and Handouts Education comprehension: verbalized understanding and needs further education   HOME EXERCISE PROGRAM: Access Code: [email or web address] URL: [email or web address] Date: 02/25/2022 Prepared by: Vista Mink  Exercises - Standing Scapular Retraction  - 5 x daily - 7 x weekly - 1 sets - 5 reps - 5 second hold - Standing Isometric Cervical Extension with Manual Resistance  - 5 x daily - 7 x weekly - 1 sets - 5 reps - 5 hold   ASSESSMENT:  CLINICAL IMPRESSION: She is making some early progress with HEP, I did add median nerve glides today to add at home and we trialed mechanical cervical traction to see if this also helps her cervical radicular symptoms.   OBJECTIVE IMPAIRMENTS decreased activity tolerance, decreased endurance, decreased knowledge of condition, decreased strength, decreased safety awareness,  increased edema, impaired perceived functional ability, increased muscle spasms, improper body mechanics, postural dysfunction, and pain.   ACTIVITY LIMITATIONS  Affecting all ADLs .   PERSONAL FACTORS Arthritis, COPD, DM, HTN, previous R tib/fib and femur fracture, former smoker are also affecting  patient's functional outcome.    REHAB POTENTIAL: Good  CLINICAL DECISION MAKING: Stable/uncomplicated  EVALUATION COMPLEXITY: Low   GOALS: Goals reviewed with patient? Yes  SHORT TERM GOALS: Target date: 04/06/2022  (Remove Blue Hyperlink)  Kayli will report no R UE symptoms in at least 3 consecutive days. Baseline: Can be to the R hand Goal status: INITIAL   LONG TERM GOALS: Target date: 04/08/22  Improve neck, R upper trapezius and R scapular pain to 0-2/10 on the VAS. Baseline: Can be 7/10 Goal status: INITIAL  2.  Improve FOTO to 66. Baseline: 53 Goal status: INITIAL  3.  Improve cervical extension strength to 30 pounds and lateral bending to 18 pounds. Baseline: All < 10 pounds Goal status: INITIAL  4.  Makayia will be independent with her long-term HEP at DC. Baseline: Started 02/25/2022 Goal status: INITIAL    PLAN: PT FREQUENCY: 1-2x/week  PT DURATION: 6 weeks  PLANNED INTERVENTIONS: Therapeutic exercises, Therapeutic activity, Neuromuscular re-education, Patient/Family education, Joint mobilization, Dry Needling, Electrical stimulation, Cryotherapy, Traction, and Manual therapy  PLAN FOR NEXT SESSION  how was traction? cnonsider DN if time.    Debbe Odea, PT, DPT 03/09/2022, 12:59 PM

## 2022-03-13 ENCOUNTER — Ambulatory Visit (INDEPENDENT_AMBULATORY_CARE_PROVIDER_SITE_OTHER): Payer: Medicare Other | Admitting: Rehabilitative and Restorative Service Providers"

## 2022-03-13 ENCOUNTER — Encounter: Payer: Self-pay | Admitting: Rehabilitative and Restorative Service Providers"

## 2022-03-13 DIAGNOSIS — R293 Abnormal posture: Secondary | ICD-10-CM

## 2022-03-13 DIAGNOSIS — M6281 Muscle weakness (generalized): Secondary | ICD-10-CM | POA: Diagnosis not present

## 2022-03-13 DIAGNOSIS — M5412 Radiculopathy, cervical region: Secondary | ICD-10-CM | POA: Diagnosis not present

## 2022-03-13 NOTE — Therapy (Signed)
OUTPATIENT PHYSICAL THERAPY treatment   Patient Name: [name] MRN: [id number] DOB:1951-09-05, 71 y.o., female Today's Date: 03/13/2022   PT End of Session - 03/13/22 1623     Visit Number 3    Number of Visits 10    Date for PT Re-Evaluation 04/08/22    Progress Note Due on Visit 10    PT Start Time 7741    PT Stop Time 1056    PT Time Calculation (min) 38 min    Activity Tolerance Patient tolerated treatment well;No increased pain    Behavior During Therapy WFL for tasks assessed/performed              Past Medical History:  Diagnosis Date   Allergy    Anxiety    OCC   Arthritis    COPD (chronic obstructive pulmonary disease) ([place])    Diabetes mellitus without complication ([place])    no meds   GERD (gastroesophageal reflux disease)    OCC   Hereditary hemochromatosis ([place])    Hyperlipidemia    Hypertension    Vitamin D deficiency 08/2017   Past Surgical History:  Procedure Laterality Date   COLONOSCOPY     10 + years ago   [place] fx surgery   GANGLION CYST EXCISION  2012   post surgery infection   I & D EXTREMITY Right 07/01/2013   Procedure: IRRIGATION AND DEBRIDEMENT RIGHT LEG;  Surgeon: Newt Minion, MD;  Location: [facility];  Service: Orthopedics;  Laterality: Right;   LAPAROSCOPIC HYSTERECTOMY     ORIF FEMUR FRACTURE Right 07/01/2013   Procedure: OPEN REDUCTION INTERNAL FIXATION (ORIF) DISTAL FEMUR FRACTURE;  Surgeon: Newt Minion, MD;  Location: [facility];  Service: Orthopedics;  Laterality: Right;   Patient Active Problem List   Diagnosis Date Noted   Changing skin lesion 11/24/2021   Hypertension associated with diabetes ([place]) 05/28/2021   Former smoker 05/28/2021   Dyslipidemia associated with type 2 diabetes mellitus ([place]) 05/28/2021   COPD (chronic obstructive pulmonary disease) ([place]) 05/28/2021   Cataract 05/28/2021   T2DM (type 2 diabetes mellitus) ([place]) 05/28/2021   Depression, major, single episode,  complete remission ([place]) 05/28/2021   Anxiety 05/28/2021   B12 deficiency 05/28/2021   Urge incontinence 05/28/2021   Hereditary hemochromatosis ([place]) 11/03/2017    PCP: Vivi Barrack, MD  REFERRING PROVIDER: Newt Minion, MD  REFERRING DIAG: M54.2 (ICD-10-CM) - Neck pain  THERAPY DIAG:  Abnormal posture  Radiculopathy, cervical region  Muscle weakness (generalized)  ONSET DATE: About 5 weeks ago  SUBJECTIVE:  SUBJECTIVE STATEMENT: [name] notes improved numbness and tingling in her middle 3 fingers.  The R scapula/arm is still uncomfortable, although she is now able to sleep.  She reports moving and feeling better since starting PT.  PERTINENT HISTORY:  Arthritis, COPD, DM, HTN, previous R tib/fib and femur fracture, former smoker  PAIN:  Are you having pain? Yes: NPRS scale: 3-5/10 Pain location: Neck, upper trapezius and R scapula Pain description: Burning, tight Aggravating factors: Flexion and slouched postures Relieving factors: Postural correction  PRECAUTIONS: Other: Cervical  WEIGHT BEARING RESTRICTIONS No  FALLS:  Has patient fallen in last 6 months? No  LIVING ENVIRONMENT: Lives with: lives with their family Lives in: House/apartment Stairs:  OK with stairs Has following equipment at home: None  OCCUPATION: Retired  PLOF: Independent  PATIENT GOALS Return to working in the yard, tennis.  Return to sleep without pain.  OBJECTIVE:   DIAGNOSTIC FINDINGS:  Could not pull up in Epic  PATIENT SURVEYS:  FOTO 53 (Goal 66 in 10 visits)   COGNITION: Overall cognitive status: Within functional limits for tasks assessed   SENSATION: Not tested  POSTURE:  Rounded shoulders are most evident    CERVICAL ROM:   Active ROM A/PROM (deg) 02/25/22   Flexion   Extension 70  Right lateral flexion 30  Left lateral flexion 25  Right rotation 70  Left rotation 70   (Blank rows = not tested)  Strength:  In pounds with hand held dynamometer Right 02/25/22 Left 02/25/22  Shoulder flexion    Cervical extension 6.7   Cervical lateral bending 7.4 9.9   Shoulder adduction    Shoulder extension    Shoulder internal rotation    Shoulder external rotation    Middle trapezius    Lower trapezius    Elbow flexion    Elbow extension    Wrist flexion    Wrist extension    Wrist ulnar deviation    Wrist radial deviation    Wrist pronation    Wrist supination    Grip strength     (Blank rows = not tested)   TODAY'S TREATMENT:  03/13/2022  Shoulder blade pinches 10X 5 seconds Cervical extension Isometrics 10X 5 seconds Pull to chest Blue 20X 3 seconds Shoulder ER Green 10X B Shoulder extension theraband Red (Palms up, elbows straight) 10X   Cervical traction static 10 minutes 15-25#   Functional Activities: Review postural education, log roll, importance of walking with HEP   03/09/22  Rows green  X20  Shoulder extensions green X 20  Bilat shoulder ER green 2X10  Chin tucks/cervical retraction 2X10  Rt UE median nerve glides 2X10  Standing bilat shoulder flexion ball rolls up wall stretching into thoracic extension 5 sec X 10  Cervical mechanical traction 20-15# intermittent X 15 min    PATIENT EDUCATION:  Education details: See above Person educated: Patient Education method: Consulting civil engineer, Media planner, Verbal cues, and Handouts Education comprehension: verbalized understanding and needs further education   HOME EXERCISE PROGRAM: Access Code: [email or web address] URL: [email or web address] Date: 03/13/2022 Prepared by: Vista Mink  Exercises - Standing Scapular Retraction  - 5 x daily - 7 x weekly - 1 sets - 5 reps - 5 second hold - Standing Isometric Cervical Extension with Manual Resistance  - 5 x daily -  7 x weekly - 1 sets - 5 reps - 5 hold - Standing Row with Anchored Resistance Band with PLB  - 1 x daily - 7 x weekly - 1 sets - 20  reps  ASSESSMENT:  CLINICAL IMPRESSION: [name] appears to be getting centralization of her peripheral symptoms.  Hand symptoms are better as are scapular, arm and shoulder pain.  She can still get 5/10 pain (particularly with poor mechanics) and we reviewed this today.  Continue traction, postural correction, cervical and scapular strengthening to meet LTGs.   OBJECTIVE IMPAIRMENTS decreased activity tolerance, decreased endurance, decreased knowledge of condition, decreased strength, decreased safety awareness, increased edema, impaired perceived functional ability, increased muscle spasms, improper body mechanics, postural dysfunction, and pain.   ACTIVITY LIMITATIONS  Affecting all ADLs .   PERSONAL FACTORS Arthritis, COPD, DM, HTN, previous R tib/fib and femur fracture, former smoker are also affecting patient's functional outcome.    REHAB POTENTIAL: Good  CLINICAL DECISION MAKING: Stable/uncomplicated  EVALUATION COMPLEXITY: Low   GOALS: Goals reviewed with patient? Yes  SHORT TERM GOALS: Target date: 04/10/2022  (Remove Blue Hyperlink)  Aubriel will report no R UE symptoms in at least 3 consecutive days. Baseline: Can be to the R hand Goal status: On Going 03/13/2022   LONG TERM GOALS: Target date: 04/08/22  Improve neck, R upper trapezius and R scapular pain to 0-2/10 on the VAS. Baseline: Can be 7/10 Goal status: On Going 03/13/2022  2.  Improve FOTO to 66. Baseline: 53 Goal status: INITIAL  3.  Improve cervical extension strength to 30 pounds and lateral bending to 18 pounds. Baseline: All < 10 pounds Goal status: INITIAL  4.  Natalea will be independent with her long-term HEP at DC. Baseline: Started 02/25/2022 Goal status: INITIAL    PLAN: PT FREQUENCY: 1-2x/week  PT DURATION: 6 weeks  PLANNED INTERVENTIONS: Therapeutic  exercises, Therapeutic activity, Neuromuscular re-education, Patient/Family education, Joint mobilization, Dry Needling, Electrical stimulation, Cryotherapy, Traction, and Manual therapy  PLAN FOR NEXT SESSION  Static traction 25#, review posture and body mechanics with the ADLs (what silly thing did she do over the weekend), progress mid-back, scapular and cervical strength.   Farley Ly, PT, MPT 03/13/2022, 4:28 PM

## 2022-03-16 ENCOUNTER — Ambulatory Visit: Payer: Medicare Other | Admitting: Dermatology

## 2022-03-17 ENCOUNTER — Ambulatory Visit: Payer: Medicare Other | Admitting: Dermatology

## 2022-03-18 ENCOUNTER — Ambulatory Visit (INDEPENDENT_AMBULATORY_CARE_PROVIDER_SITE_OTHER): Payer: Medicare Other | Admitting: Physical Therapy

## 2022-03-18 ENCOUNTER — Encounter: Payer: Self-pay | Admitting: Physical Therapy

## 2022-03-18 DIAGNOSIS — M545 Low back pain, unspecified: Secondary | ICD-10-CM | POA: Diagnosis not present

## 2022-03-18 DIAGNOSIS — M6281 Muscle weakness (generalized): Secondary | ICD-10-CM

## 2022-03-18 DIAGNOSIS — M5412 Radiculopathy, cervical region: Secondary | ICD-10-CM | POA: Diagnosis not present

## 2022-03-18 DIAGNOSIS — R293 Abnormal posture: Secondary | ICD-10-CM

## 2022-03-18 DIAGNOSIS — G8929 Other chronic pain: Secondary | ICD-10-CM

## 2022-03-18 DIAGNOSIS — R2689 Other abnormalities of gait and mobility: Secondary | ICD-10-CM

## 2022-03-18 NOTE — Therapy (Signed)
OUTPATIENT PHYSICAL THERAPY treatment   Patient Name: [name] MRN: [id number] DOB:07-Feb-1951, 71 y.o., female Today's Date: 03/18/2022   PT End of Session - 03/18/22 1047     Visit Number 4    Number of Visits 10    Date for PT Re-Evaluation 04/08/22    Progress Note Due on Visit 10    PT Start Time 1022    PT Stop Time 1100    PT Time Calculation (min) 38 min    Activity Tolerance Patient tolerated treatment well;No increased pain    Behavior During Therapy WFL for tasks assessed/performed              Past Medical History:  Diagnosis Date   Allergy    Anxiety    OCC   Arthritis    COPD (chronic obstructive pulmonary disease) ([place])    Diabetes mellitus without complication ([place])    no meds   GERD (gastroesophageal reflux disease)    OCC   Hereditary hemochromatosis ([place])    Hyperlipidemia    Hypertension    Vitamin D deficiency 08/2017   Past Surgical History:  Procedure Laterality Date   COLONOSCOPY     10 + years ago   [place] fx surgery   GANGLION CYST EXCISION  2012   post surgery infection   I & D EXTREMITY Right 07/01/2013   Procedure: IRRIGATION AND DEBRIDEMENT RIGHT LEG;  Surgeon: Newt Minion, MD;  Location: [facility];  Service: Orthopedics;  Laterality: Right;   LAPAROSCOPIC HYSTERECTOMY     ORIF FEMUR FRACTURE Right 07/01/2013   Procedure: OPEN REDUCTION INTERNAL FIXATION (ORIF) DISTAL FEMUR FRACTURE;  Surgeon: Newt Minion, MD;  Location: [facility];  Service: Orthopedics;  Laterality: Right;   Patient Active Problem List   Diagnosis Date Noted   Changing skin lesion 11/24/2021   Hypertension associated with diabetes ([place]) 05/28/2021   Former smoker 05/28/2021   Dyslipidemia associated with type 2 diabetes mellitus ([place]) 05/28/2021   COPD (chronic obstructive pulmonary disease) ([place]) 05/28/2021   Cataract 05/28/2021   T2DM (type 2 diabetes mellitus) ([place]) 05/28/2021   Depression, major, single episode,  complete remission ([place]) 05/28/2021   Anxiety 05/28/2021   B12 deficiency 05/28/2021   Urge incontinence 05/28/2021   Hereditary hemochromatosis ([place]) 11/03/2017    PCP: Vivi Barrack, MD  REFERRING PROVIDER: Newt Minion, MD  REFERRING DIAG: M54.2 (ICD-10-CM) - Neck pain  THERAPY DIAG:  Abnormal posture  Radiculopathy, cervical region  Muscle weakness (generalized)  Chronic midline low back pain without sciatica  Other abnormalities of gait and mobility  ONSET DATE: About 5 weeks ago  SUBJECTIVE:  SUBJECTIVE STATEMENT: [name] says the pain is a lot better overall, denies N/T in her arm  PERTINENT HISTORY:  Arthritis, COPD, DM, HTN, previous R tib/fib and femur fracture, former smoker  PAIN:  Are you having pain? Yes: NPRS scale: 3-5/10 Pain location: Neck, upper trapezius and R scapula Pain description: Burning, tight Aggravating factors: Flexion and slouched postures Relieving factors: Postural correction  PRECAUTIONS: Other: Cervical  WEIGHT BEARING RESTRICTIONS No  FALLS:  Has patient fallen in last 6 months? No  LIVING ENVIRONMENT: Lives with: lives with their family Lives in: House/apartment Stairs:  OK with stairs Has following equipment at home: None  OCCUPATION: Retired  PLOF: Independent  PATIENT GOALS Return to working in the yard, tennis.  Return to sleep without pain.  OBJECTIVE:   DIAGNOSTIC FINDINGS:  Could not pull up in Epic  PATIENT SURVEYS:  FOTO 53 (Goal 66 in 10 visits)   COGNITION: Overall cognitive status: Within functional limits for tasks assessed   SENSATION: Not tested  POSTURE:  Rounded shoulders are most evident    CERVICAL ROM:   Active ROM A/PROM (deg) 02/25/22  Flexion   Extension 70  Right lateral  flexion 30  Left lateral flexion 25  Right rotation 70  Left rotation 70   (Blank rows = not tested)  Strength:  In pounds with hand held dynamometer Right 02/25/22 Left 02/25/22  Shoulder flexion    Cervical extension 6.7   Cervical lateral bending 7.4 9.9   Shoulder adduction    Shoulder extension    Shoulder internal rotation    Shoulder external rotation    Middle trapezius    Lower trapezius    Elbow flexion    Elbow extension    Wrist flexion    Wrist extension    Wrist ulnar deviation    Wrist radial deviation    Wrist pronation    Wrist supination    Grip strength     (Blank rows = not tested)   TODAY'S TREATMENT:   03/18/22  UBE L5 X 5 min LE and UE   Bilat shoulder ER green 2X10  Chin tucks/cervical retraction 2X10  Row machine 25# 2X10  Standing horizontal abduction green 2 X10  Cervical mechanical traction 25-15# intermittent X 15 min  03/13/2022  Shoulder blade pinches 10X 5 seconds Cervical extension Isometrics 10X 5 seconds Pull to chest Blue 20X 3 seconds Shoulder ER Green 10X B Shoulder extension theraband Red (Palms up, elbows straight) 10X   Cervical traction static 10 minutes 15-25#   Functional Activities: Review postural education, log roll, importance of walking with HEP     PATIENT EDUCATION:  Education details: See above Person educated: Patient Education method: Consulting civil engineer, Demonstration, Verbal cues, and Handouts Education comprehension: verbalized understanding and needs further education   HOME EXERCISE PROGRAM: Access Code: [email or web address] URL: [email or web address] Date: 03/13/2022 Prepared by: Vista Mink  Exercises - Standing Scapular Retraction  - 5 x daily - 7 x weekly - 1 sets - 5 reps - 5 second hold - Standing Isometric Cervical Extension with Manual Resistance  - 5 x daily - 7 x weekly - 1 sets - 5 reps - 5 hold - Standing Row with Anchored Resistance Band with PLB  - 1 x daily - 7 x weekly - 1 sets  - 20 reps  ASSESSMENT:  CLINICAL IMPRESSION: [name] appears to be getting centralization of her peripheral symptoms.  Hand symptoms are better as are scapular, arm and shoulder pain.  She can still get  5/10 pain (particularly with poor mechanics) and we reviewed this today.  Continue traction, postural correction, cervical and scapular strengthening to meet LTGs.   OBJECTIVE IMPAIRMENTS decreased activity tolerance, decreased endurance, decreased knowledge of condition, decreased strength, decreased safety awareness, increased edema, impaired perceived functional ability, increased muscle spasms, improper body mechanics, postural dysfunction, and pain.   ACTIVITY LIMITATIONS  Affecting all ADLs .   PERSONAL FACTORS Arthritis, COPD, DM, HTN, previous R tib/fib and femur fracture, former smoker are also affecting patient's functional outcome.    REHAB POTENTIAL: Good  CLINICAL DECISION MAKING: Stable/uncomplicated  EVALUATION COMPLEXITY: Low   GOALS: Goals reviewed with patient? Yes  SHORT TERM GOALS: Target date: 04/15/2022  (Remove Blue Hyperlink)  Tarryn will report no R UE symptoms in at least 3 consecutive days. Baseline: Can be to the R hand Goal status: On Going 03/13/2022   LONG TERM GOALS: Target date: 04/08/22  Improve neck, R upper trapezius and R scapular pain to 0-2/10 on the VAS. Baseline: Can be 7/10 Goal status: On Going 03/13/2022  2.  Improve FOTO to 66. Baseline: 53 Goal status: INITIAL  3.  Improve cervical extension strength to 30 pounds and lateral bending to 18 pounds. Baseline: All < 10 pounds Goal status: INITIAL  4.  Jenea will be independent with her long-term HEP at DC. Baseline: Started 02/25/2022 Goal status: INITIAL    PLAN: PT FREQUENCY: 1-2x/week  PT DURATION: 6 weeks  PLANNED INTERVENTIONS: Therapeutic exercises, Therapeutic activity, Neuromuscular re-education, Patient/Family education, Joint mobilization, Dry Needling, Electrical  stimulation, Cryotherapy, Traction, and Manual therapy  PLAN FOR NEXT SESSION  traction if desired, progress mid-back, scapular and cervical strength.   Debbe Odea, PT, DPT 03/18/2022, 10:54 AM

## 2022-03-20 ENCOUNTER — Ambulatory Visit (INDEPENDENT_AMBULATORY_CARE_PROVIDER_SITE_OTHER): Payer: Medicare Other | Admitting: Physical Therapy

## 2022-03-20 ENCOUNTER — Encounter: Payer: Self-pay | Admitting: Physical Therapy

## 2022-03-20 DIAGNOSIS — M6281 Muscle weakness (generalized): Secondary | ICD-10-CM | POA: Diagnosis not present

## 2022-03-20 DIAGNOSIS — M545 Low back pain, unspecified: Secondary | ICD-10-CM | POA: Diagnosis not present

## 2022-03-20 DIAGNOSIS — R2689 Other abnormalities of gait and mobility: Secondary | ICD-10-CM

## 2022-03-20 DIAGNOSIS — G8929 Other chronic pain: Secondary | ICD-10-CM

## 2022-03-20 DIAGNOSIS — R293 Abnormal posture: Secondary | ICD-10-CM | POA: Diagnosis not present

## 2022-03-20 DIAGNOSIS — M5412 Radiculopathy, cervical region: Secondary | ICD-10-CM

## 2022-03-20 NOTE — Therapy (Signed)
OUTPATIENT PHYSICAL THERAPY treatment/Discharge PHYSICAL THERAPY DISCHARGE SUMMARY  Visits from Start of Care: 5  Current functional level related to goals / functional outcomes: See below   Remaining deficits: See below   Education / Equipment: HEP  Plan: Patient agrees to discharge.  Patient goals were met. Patient is being discharged due to meeting the stated rehab goals and being pleased with her progress       Patient Name: [name] MRN: [id number] DOB:30-Mar-1951, 71 y.o., female Today's Date: 03/20/2022   PT End of Session - 03/20/22 1046     Visit Number 5    Number of Visits 10    Date for PT Re-Evaluation 04/08/22    Progress Note Due on Visit 10    PT Start Time 1019    PT Stop Time 1057    PT Time Calculation (min) 38 min    Activity Tolerance Patient tolerated treatment well;No increased pain    Behavior During Therapy WFL for tasks assessed/performed              Past Medical History:  Diagnosis Date   Allergy    Anxiety    OCC   Arthritis    COPD (chronic obstructive pulmonary disease) ([place])    Diabetes mellitus without complication ([place])    no meds   GERD (gastroesophageal reflux disease)    OCC   Hereditary hemochromatosis ([place])    Hyperlipidemia    Hypertension    Vitamin D deficiency 08/2017   Past Surgical History:  Procedure Laterality Date   COLONOSCOPY     10 + years ago   [place] fx surgery   GANGLION CYST EXCISION  2012   post surgery infection   I & D EXTREMITY Right 07/01/2013   Procedure: IRRIGATION AND DEBRIDEMENT RIGHT LEG;  Surgeon: Newt Minion, MD;  Location: [facility];  Service: Orthopedics;  Laterality: Right;   LAPAROSCOPIC HYSTERECTOMY     ORIF FEMUR FRACTURE Right 07/01/2013   Procedure: OPEN REDUCTION INTERNAL FIXATION (ORIF) DISTAL FEMUR FRACTURE;  Surgeon: Newt Minion, MD;  Location: [facility];  Service: Orthopedics;  Laterality: Right;   Patient Active Problem List    Diagnosis Date Noted   Changing skin lesion 11/24/2021   Hypertension associated with diabetes ([place]) 05/28/2021   Former smoker 05/28/2021   Dyslipidemia associated with type 2 diabetes mellitus ([place]) 05/28/2021   COPD (chronic obstructive pulmonary disease) ([place]) 05/28/2021   Cataract 05/28/2021   T2DM (type 2 diabetes mellitus) ([place]) 05/28/2021   Depression, major, single episode, complete remission ([place]) 05/28/2021   Anxiety 05/28/2021   B12 deficiency 05/28/2021   Urge incontinence 05/28/2021   Hereditary hemochromatosis ([place]) 11/03/2017    PCP: Vivi Barrack, MD  REFERRING PROVIDER: Newt Minion, MD  REFERRING DIAG: M54.2 (ICD-10-CM) - Neck pain  THERAPY DIAG:  Abnormal posture  Radiculopathy, cervical region  Muscle weakness (generalized)  Chronic midline low back pain without sciatica  Other abnormalities of gait and mobility  ONSET DATE: About 5 weeks ago  SUBJECTIVE:  SUBJECTIVE STATEMENT: [name] says she is healed and no longer has pain, feels ready to discharge today  PERTINENT HISTORY:  Arthritis, COPD, DM, HTN, previous R tib/fib and femur fracture, former smoker  PAIN:  No pain reported today  PRECAUTIONS: Other: Cervical  WEIGHT BEARING RESTRICTIONS No  FALLS:  Has patient fallen in last 6 months? No  LIVING ENVIRONMENT: Lives with: lives with their family Lives in: House/apartment Stairs:  OK with stairs Has following equipment at home: None  OCCUPATION: Retired  PLOF: Independent  PATIENT GOALS Return to working in the yard, tennis.  Return to sleep without pain.  OBJECTIVE:   DIAGNOSTIC FINDINGS:  Could not pull up in Epic  PATIENT SURVEYS:  FOTO 53 (Goal 66 in 10 visits)   COGNITION: Overall cognitive status: Within functional  limits for tasks assessed   SENSATION: Not tested  POSTURE:  Rounded shoulders are most evident    CERVICAL ROM:   Active ROM A/PROM (deg) 02/25/22 AROM 03/20/22  Flexion    Extension 70 70  Right lateral flexion 30 40  Left lateral flexion 25 40  Right rotation 70 80  Left rotation 70 80   (Blank rows = not tested)  Strength:  In pounds with hand held dynamometer Right 02/25/22 Left 02/25/22   Shoulder flexion     Cervical extension 6.7  5/5 MMT   Cervical lateral bending 7.4 9.9  5/5 MMT  Shoulder adduction     Shoulder extension     Shoulder internal rotation     Shoulder external rotation     Middle trapezius     Lower trapezius     Elbow flexion     Elbow extension     Wrist flexion     Wrist extension     Wrist ulnar deviation     Wrist radial deviation     Wrist pronation     Wrist supination     Grip strength      (Blank rows = not tested)   TODAY'S TREATMENT:  03/20/22  Bilat shoulder ER green 2X15  Chin tucks/cervical retraction 2X10  Rows and extensions  green 2X15 each  Standing horizontal abduction green 2 X15  Cervical mechanical traction 25-15# intermittent X 15 min 03/18/22  UBE L5 X 5 min LE and UE   Bilat shoulder ER green 2X10  Chin tucks/cervical retraction 2X10  Row machine 25# 2X10  Standing horizontal abduction green 2 X10  Cervical mechanical traction 25-15# intermittent X 15 min   PATIENT EDUCATION:  Education details: See above Person educated: Patient Education method: Consulting civil engineer, Media planner, Verbal cues, and Handouts Education comprehension: verbalized understanding and needs further education   HOME EXERCISE PROGRAM: Access Code: [email or web address] URL: [email or web address] Date: 03/13/2022 Prepared by: Vista Mink  Exercises - Standing Scapular Retraction  - 5 x daily - 7 x weekly - 1 sets - 5 reps - 5 second hold - Standing Isometric Cervical Extension with Manual Resistance  - 5 x daily - 7 x weekly - 1  sets - 5 reps - 5 hold - Standing Row with Anchored Resistance Band with PLB  - 1 x daily - 7 x weekly - 1 sets - 20 reps  ASSESSMENT:  CLINICAL IMPRESSION: [name] has now met all her PT goals and is pleased with her progress. She will dishcarged to HEP and she had no further questions or concerns.   OBJECTIVE IMPAIRMENTS decreased activity tolerance, decreased endurance, decreased knowledge of condition, decreased strength, decreased safety awareness, increased edema,  impaired perceived functional ability, increased muscle spasms, improper body mechanics, postural dysfunction, and pain.   ACTIVITY LIMITATIONS  Affecting all ADLs .   PERSONAL FACTORS Arthritis, COPD, DM, HTN, previous R tib/fib and femur fracture, former smoker are also affecting patient's functional outcome.    REHAB POTENTIAL: Good  CLINICAL DECISION MAKING: Stable/uncomplicated  EVALUATION COMPLEXITY: Low   GOALS: Goals reviewed with patient? Yes  SHORT TERM GOALS: Target date: 04/17/2022   Nalaya will report no R UE symptoms in at least 3 consecutive days. Baseline: Can be to the R hand Goal status: MET   LONG TERM GOALS: Target date: 04/08/22  Improve neck, R upper trapezius and R scapular pain to 0-2/10 on the VAS. Baseline: Can be 7/10 Goal status: MET  2.  Improve FOTO to 66. Baseline: 53, now 86 Goal status: MET  3.  Improve cervical extension strength to 30 pounds and lateral bending to 18 pounds. Baseline: All < 10 pounds Goal status: met now 5/5 10#  4.  Miyako will be independent with her long-term HEP at DC. Baseline: Started 02/25/2022 Goal status: MET    PLAN: PT FREQUENCY: 1-2x/week  PT DURATION: 6 weeks  PLANNED INTERVENTIONS: Therapeutic exercises, Therapeutic activity, Neuromuscular re-education, Patient/Family education, Joint mobilization, Dry Needling, Electrical stimulation, Cryotherapy, Traction, and Manual therapy  PLAN FOR NEXT SESSION  DC today   Debbe Odea, PT,  DPT 03/20/2022, 10:47 AM

## 2022-03-23 ENCOUNTER — Encounter: Payer: Medicare Other | Admitting: Rehabilitative and Restorative Service Providers"

## 2022-03-23 ENCOUNTER — Ambulatory Visit: Payer: Medicare Other | Admitting: Dermatology

## 2022-03-23 DIAGNOSIS — L821 Other seborrheic keratosis: Secondary | ICD-10-CM | POA: Diagnosis not present

## 2022-03-23 DIAGNOSIS — Z808 Family history of malignant neoplasm of other organs or systems: Secondary | ICD-10-CM | POA: Diagnosis not present

## 2022-03-23 DIAGNOSIS — Z1283 Encounter for screening for malignant neoplasm of skin: Secondary | ICD-10-CM

## 2022-03-23 DIAGNOSIS — L281 Prurigo nodularis: Secondary | ICD-10-CM | POA: Diagnosis not present

## 2022-03-23 MED ORDER — CLOBETASOL PROPIONATE 0.05 % EX OINT: 1.0000 "application " | TOPICAL_OINTMENT | Freq: Every day | CUTANEOUS | 1 refills | Status: DC

## 2022-03-25 ENCOUNTER — Encounter: Payer: Medicare Other | Admitting: Physical Therapy

## 2022-03-30 ENCOUNTER — Ambulatory Visit: Payer: Medicare Other | Admitting: Orthopedic Surgery

## 2022-04-10 ENCOUNTER — Encounter: Payer: Self-pay | Admitting: Dermatology

## 2022-04-15 ENCOUNTER — Other Ambulatory Visit: Payer: Self-pay

## 2022-04-15 ENCOUNTER — Other Ambulatory Visit: Payer: Self-pay | Admitting: Hematology and Oncology

## 2022-04-15 ENCOUNTER — Inpatient Hospital Stay: Payer: Medicare Other

## 2022-04-15 ENCOUNTER — Inpatient Hospital Stay: Payer: Medicare Other | Attending: Hematology and Oncology

## 2022-04-15 LAB — CMP (CANCER CENTER ONLY)
ALT: 42 U/L (ref 0–44)
AST: 36 U/L (ref 15–41)
Albumin: 4.5 g/dL (ref 3.5–5.0)
Alkaline Phosphatase: 81 U/L (ref 38–126)
Anion gap: 7 (ref 5–15)
BUN: 14 mg/dL (ref 8–23)
CO2: 28 mmol/L (ref 22–32)
Calcium: 9.3 mg/dL (ref 8.9–10.3)
Chloride: 104 mmol/L (ref 98–111)
Creatinine: 0.58 mg/dL (ref 0.44–1.00)
GFR, Estimated: >60 mL/min (ref >60)
Glucose, Bld: 164 mg/dL — ABNORMAL HIGH (ref 70–99)
Potassium: 4.5 mmol/L (ref 3.5–5.1)
Sodium: 139 mmol/L (ref 135–145)
Total Bilirubin: 0.9 mg/dL (ref 0.3–1.2)
Total Protein: 6.5 g/dL (ref 6.5–8.1)

## 2022-04-15 LAB — IRON AND IRON BINDING CAPACITY (CC-WL,HP ONLY)
Iron: 121 ug/dL (ref 28–170)
Saturation Ratios: 34 % — ABNORMAL HIGH (ref 10.4–31.8)
TIBC: 354 ug/dL (ref 250–450)
UIBC: 233 ug/dL (ref 148–442)

## 2022-04-15 LAB — CBC WITH DIFFERENTIAL (CANCER CENTER ONLY)
Abs Immature Granulocytes: 0.01 10*3/uL (ref 0.00–0.07)
Basophils Absolute: 0 10*3/uL (ref 0.0–0.1)
Basophils Relative: 1 %
Eosinophils Absolute: 0.1 10*3/uL (ref 0.0–0.5)
Eosinophils Relative: 3 %
HCT: 41.5 % (ref 36.0–46.0)
Hemoglobin: 15.2 g/dL — ABNORMAL HIGH (ref 12.0–15.0)
Immature Granulocytes: 0 %
Lymphocytes Relative: 24 %
Lymphs Abs: 1 10*3/uL (ref 0.7–4.0)
MCH: 34.5 pg — ABNORMAL HIGH (ref 26.0–34.0)
MCHC: 36.6 g/dL — ABNORMAL HIGH (ref 30.0–36.0)
MCV: 94.1 fL (ref 80.0–100.0)
Monocytes Absolute: 0.3 10*3/uL (ref 0.1–1.0)
Monocytes Relative: 6 %
Neutro Abs: 2.7 10*3/uL (ref 1.7–7.7)
Neutrophils Relative %: 66 %
Platelet Count: 87 10*3/uL — ABNORMAL LOW (ref 150–400)
RBC: 4.41 MIL/uL (ref 3.87–5.11)
RDW: 12.5 % (ref 11.5–15.5)
WBC Count: 4.1 10*3/uL (ref 4.0–10.5)
nRBC: 0 % (ref 0.0–0.2)

## 2022-04-15 LAB — FERRITIN: Ferritin: 62 ng/mL (ref 11–307)

## 2022-04-15 NOTE — Addendum Note (Signed)
Addended by: Severiano Gilbert on: 04/15/2022 01:16 PM   Modules accepted: Orders

## 2022-04-15 NOTE — Progress Notes (Signed)
Per Dr. Lindi Adie - okay to proceed with phlebotomy regardless of labs.

## 2022-04-15 NOTE — Progress Notes (Signed)
[name] presents today for phlebotomy per Lindi Adie MD orders. Phlebotomy procedure started at 1115 and ended at 1120. 514 grams removed via 16 G RAC. IV needle removed intact. Patient observed for 30 minutes after procedure without any incident. Food and drink provided. Patient tolerated procedure well. VSS stable upon discharge.

## 2022-04-15 NOTE — Patient Instructions (Signed)

## 2022-04-16 ENCOUNTER — Telehealth: Payer: Self-pay | Admitting: Hematology and Oncology

## 2022-04-16 NOTE — Telephone Encounter (Signed)
.  Called patient to schedule appointment per 6/28 inbasket, patient is aware of date and time.   

## 2022-05-21 ENCOUNTER — Encounter: Payer: Self-pay | Admitting: Family Medicine

## 2022-05-21 ENCOUNTER — Ambulatory Visit (INDEPENDENT_AMBULATORY_CARE_PROVIDER_SITE_OTHER): Payer: Medicare Other | Admitting: Family Medicine

## 2022-05-21 VITALS — BP 108/69 | HR 82 | Temp 97.4°F | Ht 61.0 in | Wt 145.2 lb

## 2022-05-21 DIAGNOSIS — F325 Major depressive disorder, single episode, in full remission: Secondary | ICD-10-CM

## 2022-05-21 DIAGNOSIS — Z0001 Encounter for general adult medical examination with abnormal findings: Secondary | ICD-10-CM

## 2022-05-21 DIAGNOSIS — E1169 Type 2 diabetes mellitus with other specified complication: Secondary | ICD-10-CM | POA: Diagnosis not present

## 2022-05-21 DIAGNOSIS — L989 Disorder of the skin and subcutaneous tissue, unspecified: Secondary | ICD-10-CM

## 2022-05-21 DIAGNOSIS — I152 Hypertension secondary to endocrine disorders: Secondary | ICD-10-CM

## 2022-05-21 DIAGNOSIS — J449 Chronic obstructive pulmonary disease, unspecified: Secondary | ICD-10-CM

## 2022-05-21 DIAGNOSIS — F419 Anxiety disorder, unspecified: Secondary | ICD-10-CM

## 2022-05-21 DIAGNOSIS — E538 Deficiency of other specified B group vitamins: Secondary | ICD-10-CM

## 2022-05-21 DIAGNOSIS — E785 Hyperlipidemia, unspecified: Secondary | ICD-10-CM

## 2022-05-21 DIAGNOSIS — E1159 Type 2 diabetes mellitus with other circulatory complications: Secondary | ICD-10-CM

## 2022-05-21 LAB — LIPID PANEL
Cholesterol: 195 mg/dL (ref 0–200)
HDL: 43.2 mg/dL (ref >39.00)
NonHDL: 151.73
Total CHOL/HDL Ratio: 5
Triglycerides: 260 mg/dL — ABNORMAL HIGH (ref 0.0–149.0)
VLDL: 52 mg/dL — ABNORMAL HIGH (ref 0.0–40.0)

## 2022-05-21 LAB — LDL CHOLESTEROL, DIRECT: Direct LDL: 110 mg/dL

## 2022-05-21 LAB — VITAMIN B12: Vitamin B-12: 951 pg/mL — ABNORMAL HIGH (ref 211–911)

## 2022-05-21 LAB — TSH: TSH: 2.9 u[IU]/mL (ref 0.35–5.50)

## 2022-05-21 LAB — HEMOGLOBIN A1C: Hgb A1c MFr Bld: 5.2 % (ref 4.6–6.5)

## 2022-05-21 NOTE — Assessment & Plan Note (Signed)
Check B12 

## 2022-05-21 NOTE — Patient Instructions (Signed)
It was very nice to see you today!  Keep up the good work with diet and exercise.  We will check blood work today.  We will see you back in year for your next physical.  Come back sooner if needed.  Take care, Dr Jerline Pain  PLEASE NOTE:  If you had any lab tests please let us know if you have not heard back within a few days. You may see your results on mychart before we have a chance to review them but we will give you a call once they are reviewed by [place]. If we ordered any referrals today, please let us know if you have not heard from their office within the next week.   Please try these tips to maintain a healthy lifestyle:  Eat at least 3 REAL meals and 1-2 snacks per day.  Aim for no more than 5 hours between eating.  If you eat breakfast, please do so within one hour of getting up.   Each meal should contain half fruits/vegetables, one quarter protein, and one quarter carbs (no bigger than a computer mouse)  Cut down on sweet beverages. This includes juice, soda, and sweet tea.   Drink at least 1 glass of water with each meal and aim for at least 8 glasses per day  Exercise at least 150 minutes every week.    Preventive Care 54 Years and Older, Female Preventive care refers to lifestyle choices and visits with your health care provider that can promote health and wellness. Preventive care visits are also called wellness exams. What can I expect for my preventive care visit? Counseling Your health care provider may ask you questions about your: Medical history, including: Past medical problems. Family medical history. Pregnancy and menstrual history. History of falls. Current health, including: Memory and ability to understand (cognition). Emotional well-being. Home life and relationship well-being. Sexual activity and sexual health. Lifestyle, including: Alcohol, nicotine or tobacco, and drug use. Access to firearms. Diet, exercise, and sleep habits. Work and work  Statistician. Sunscreen use. Safety issues such as seatbelt and bike helmet use. Physical exam Your health care provider will check your: Height and weight. These may be used to calculate your BMI (body mass index). BMI is a measurement that tells if you are at a healthy weight. Waist circumference. This measures the distance around your waistline. This measurement also tells if you are at a healthy weight and may help predict your risk of certain diseases, such as type 2 diabetes and high blood pressure. Heart rate and blood pressure. Body temperature. Skin for abnormal spots. What immunizations do I need?  Vaccines are usually given at various ages, according to a schedule. Your health care provider will recommend vaccines for you based on your age, medical history, and lifestyle or other factors, such as travel or where you work. What tests do I need? Screening Your health care provider may recommend screening tests for certain conditions. This may include: Lipid and cholesterol levels. Hepatitis C test. Hepatitis B test. HIV (human immunodeficiency virus) test. STI (sexually transmitted infection) testing, if you are at risk. Lung cancer screening. Colorectal cancer screening. Diabetes screening. This is done by checking your blood sugar (glucose) after you have not eaten for a while (fasting). Mammogram. Talk with your health care provider about how often you should have regular mammograms. BRCA-related cancer screening. This may be done if you have a family history of breast, ovarian, tubal, or peritoneal cancers. Bone density scan. This is done  to screen for osteoporosis. Talk with your health care provider about your test results, treatment options, and if necessary, the need for more tests. Follow these instructions at home: Eating and drinking  Eat a diet that includes fresh fruits and vegetables, whole grains, lean protein, and low-fat dairy products. Limit your intake of  foods with high amounts of sugar, saturated fats, and salt. Take vitamin and mineral supplements as recommended by your health care provider. Do not drink alcohol if your health care provider tells you not to drink. If you drink alcohol: Limit how much you have to 0-1 drink a day. Know how much alcohol is in your drink. In the [place] one drink equals one 12 oz bottle of beer (355 mL), one 5 oz glass of wine (148 mL), or one 1 oz glass of hard liquor (44 mL). Lifestyle Brush your teeth every morning and night with fluoride toothpaste. Floss one time each day. Exercise for at least 30 minutes 5 or more days each week. Do not use any products that contain nicotine or tobacco. These products include cigarettes, chewing tobacco, and vaping devices, such as e-cigarettes. If you need help quitting, ask your health care provider. Do not use drugs. If you are sexually active, practice safe sex. Use a condom or other form of protection in order to prevent STIs. Take aspirin only as told by your health care provider. Make sure that you understand how much to take and what form to take. Work with your health care provider to find out whether it is safe and beneficial for you to take aspirin daily. Ask your health care provider if you need to take a cholesterol-lowering medicine (statin). Find healthy ways to manage stress, such as: Meditation, yoga, or listening to music. Journaling. Talking to a trusted person. Spending time with friends and family. Minimize exposure to UV radiation to reduce your risk of skin cancer. Safety Always wear your seat belt while driving or riding in a vehicle. Do not drive: If you have been drinking alcohol. Do not ride with someone who has been drinking. When you are tired or distracted. While texting. If you have been using any mind-altering substances or drugs. Wear a helmet and other protective equipment during sports activities. If you have firearms in your house,  make sure you follow all gun safety procedures. What's next? Visit your health care provider once a year for an annual wellness visit. Ask your health care provider how often you should have your eyes and teeth checked. Stay up to date on all vaccines. This information is not intended to replace advice given to you by your health care provider. Make sure you discuss any questions you have with your health care provider. Document Revised: 04/02/2021 Document Reviewed: 04/02/2021 Elsevier Patient Education  [place].

## 2022-05-21 NOTE — Assessment & Plan Note (Signed)
Stable on Zoloft 100 mg daily. 

## 2022-05-21 NOTE — Assessment & Plan Note (Signed)
Not currently on any medications.  She is working hard on diet and exercise.  We will recheck her A1c today.

## 2022-05-21 NOTE — Assessment & Plan Note (Signed)
Check labs.  Continue pravastatin 40 mg daily.

## 2022-05-21 NOTE — Assessment & Plan Note (Signed)
At goal today on amlodipine 10 mg daily and losartan 100 mg daily.

## 2022-05-21 NOTE — Assessment & Plan Note (Signed)
Symptoms are stable.  She follows with pulmonology.  Uses albuterol as needed.

## 2022-05-21 NOTE — Progress Notes (Signed)
Chief Complaint:  [name] is a 71 y.o. female who presents today for her annual comprehensive physical exam.    Assessment/Plan:  Chronic Problems Addressed Today: Changing skin lesion Previous dermatologist is no longer practicing.  We will place referral to new dermatologist.  B12 deficiency Check B12.  Anxiety Stable on Zoloft 100 mg daily.  Depression, major, single episode, complete remission ([facility]) Stable on Zoloft 100 mg daily.  T2DM (type 2 diabetes mellitus) ([place]) Not currently on any medications.  She is working hard on diet and exercise.  We will recheck her A1c today.  COPD (chronic obstructive pulmonary disease) ([facility]) Symptoms are stable.  She follows with pulmonology.  Uses albuterol as needed.  Dyslipidemia associated with type 2 diabetes mellitus ([place]) Check labs.  Continue pravastatin 40 mg daily.  Hypertension associated with diabetes ([place]) At goal today on amlodipine 10 mg daily and losartan 100 mg daily.  Preventative Healthcare: Check labs. UTD on colon cancer screening and mammogram.   Patient Counseling(The following topics were reviewed and/or handout was given):  -Nutrition: Stressed importance of moderation in sodium/caffeine intake, saturated fat and cholesterol, caloric balance, sufficient intake of fresh fruits, vegetables, and fiber.  -Stressed the importance of regular exercise.   -Substance Abuse: Discussed cessation/primary prevention of tobacco, alcohol, or other drug use; driving or other dangerous activities under the influence; availability of treatment for abuse.   -Injury prevention: Discussed safety belts, safety helmets, smoke detector, smoking near bedding or upholstery.   -Sexuality: Discussed sexually transmitted diseases, partner selection, use of condoms, avoidance of unintended pregnancy and contraceptive alternatives.   -Dental health: Discussed importance of regular tooth brushing, flossing, and dental visits.   -Health maintenance and immunizations reviewed. Please refer to Health maintenance section.  Return to care in 1 year for next preventative visit.     Subjective:  HPI:  She has no acute complaints today.   Lifestyle Diet: Cutting down on portions. Trying to get plenty of fruits and vegetables.  Exercise: Working in yard.      05/21/2022    1:44 PM  Depression screen PHQ 2/9  Decreased Interest 0  Down, Depressed, Hopeless 0  PHQ - 2 Score 0  Altered sleeping 0  Tired, decreased energy 0  Change in appetite 0  Feeling bad or failure about yourself  0  Trouble concentrating 0  Moving slowly or fidgety/restless 0  Suicidal thoughts 0  PHQ-9 Score 0    Health Maintenance Due  Topic Date Due   FOOT EXAM  Never done   OPHTHALMOLOGY EXAM  Never done   Hepatitis C Screening  Never done   COVID-19 Vaccine (5 - Booster) 10/24/2020   HEMOGLOBIN A1C  03/12/2022   INFLUENZA VACCINE  05/19/2022     ROS: Per HPI, otherwise a complete review of systems was negative.   PMH:  The following were reviewed and entered/updated in epic: Past Medical History:  Diagnosis Date   Allergy    Anxiety    OCC   Arthritis    COPD (chronic obstructive pulmonary disease) ([place])    Diabetes mellitus without complication ([place])    no meds   GERD (gastroesophageal reflux disease)    OCC   Hereditary hemochromatosis ([place])    Hyperlipidemia    Hypertension    Vitamin D deficiency 08/2017   Patient Active Problem List   Diagnosis Date Noted   Changing skin lesion 11/24/2021   Hypertension associated with diabetes ([place]) 05/28/2021   Former smoker 05/28/2021  Dyslipidemia associated with type 2 diabetes mellitus ([place]) 05/28/2021   COPD (chronic obstructive pulmonary disease) ([place]) 05/28/2021   Cataract 05/28/2021   T2DM (type 2 diabetes mellitus) ([place]) 05/28/2021   Depression, major, single episode, complete remission ([place]) 05/28/2021   Anxiety 05/28/2021   B12 deficiency 05/28/2021    Urge incontinence 05/28/2021   Hereditary hemochromatosis ([place]) 11/03/2017   Past Surgical History:  Procedure Laterality Date   COLONOSCOPY     10 + years ago   [place] fx surgery   GANGLION CYST EXCISION  2012   post surgery infection   I & D EXTREMITY Right 07/01/2013   Procedure: IRRIGATION AND DEBRIDEMENT RIGHT LEG;  Surgeon: Newt Minion, MD;  Location: [facility];  Service: Orthopedics;  Laterality: Right;   LAPAROSCOPIC HYSTERECTOMY     ORIF FEMUR FRACTURE Right 07/01/2013   Procedure: OPEN REDUCTION INTERNAL FIXATION (ORIF) DISTAL FEMUR FRACTURE;  Surgeon: Newt Minion, MD;  Location: [facility];  Service: Orthopedics;  Laterality: Right;    Family History  Problem Relation Age of Onset   Breast cancer Neg Hx    Colon polyps Neg Hx    Colon cancer Neg Hx    Esophageal cancer Neg Hx    Rectal cancer Neg Hx    Stomach cancer Neg Hx     Medications- reviewed and updated Current Outpatient Medications  Medication Sig Dispense Refill   albuterol (VENTOLIN HFA) 108 (90 Base) MCG/ACT inhaler 2 puffs as needed     amLODipine (NORVASC) 10 MG tablet Take 1 tablet (10 mg total) by mouth daily. 90 tablet 3   B Complex Vitamins (B COMPLEX 1 PO) Take by mouth.     cholecalciferol (VITAMIN D) 1000 units tablet Take 5,000 Units by mouth daily.     Homeopathic Products (LIVER SUPPORT) SUBL Place under the tongue.     losartan (COZAAR) 100 MG tablet Take 1 tablet (100 mg total) by mouth daily. 90 tablet 3   MYRBETRIQ 25 MG TB24 tablet TAKE ONE TABLET BY MOUTH DAILY 30 tablet 5   pravastatin (PRAVACHOL) 40 MG tablet Take 1 tablet (40 mg total) by mouth daily. 90 tablet 3   sertraline (ZOLOFT) 100 MG tablet Take 1 tablet (100 mg total) by mouth daily. 90 tablet 3   vitamin C (ASCORBIC ACID) 500 MG tablet Take 500 mg by mouth daily.     No current facility-administered medications for this visit.    Allergies-reviewed and updated No Known Allergies  Social  History   Socioeconomic History   Marital status: Married    Spouse name: Not on file   Number of children: Not on file   Years of education: Not on file   Highest education level: Not on file  Occupational History   Not on file  Tobacco Use   Smoking status: Former    Packs/day: 1.00    Years: 44.00    Total pack years: 44.00    Types: Cigarettes   Smokeless tobacco: Never  Substance and Sexual Activity   Alcohol use: Yes    Alcohol/week: 2.0 standard drinks of alcohol    Types: 2 Glasses of wine per week   Drug use: Never   Sexual activity: Not on file  Other Topics Concern   Not on file  Social History Narrative   Not on file   Social Determinants of Health   Financial Resource Strain: Low Risk  (10/27/2021)   Overall Financial Resource Strain (  CARDIA)    Difficulty of Paying Living Expenses: Not hard at all  Food Insecurity: No Food Insecurity (10/27/2021)   Hunger Vital Sign    Worried About Running Out of Food in the Last Year: Never true    Ran Out of Food in the Last Year: Never true  Transportation Needs: No Transportation Needs (10/27/2021)   PRAPARE - Hydrologist (Medical): No    Lack of Transportation (Non-Medical): No  Physical Activity: Insufficiently Active (10/27/2021)   Exercise Vital Sign    Days of Exercise per Week: 3 days    Minutes of Exercise per Session: 40 min  Stress: No Stress Concern Present (10/27/2021)   [place]    Feeling of Stress : Only a little  Social Connections: Moderately Integrated (10/27/2021)   Social Connection and Isolation Panel [NHANES]    Frequency of Communication with Friends and Family: More than three times a week    Frequency of Social Gatherings with Friends and Family: More than three times a week    Attends Religious Services: Never    Marine scientist or Organizations: Yes    Attends Music therapist: 1 to  4 times per year    Marital Status: Married        Objective:  Physical Exam: BP 108/69   Pulse 82   Temp (!) 97.4 F (36.3 C) (Temporal)   Ht '5\' 1"'$  (1.549 m)   Wt 145 lb 3.2 oz (65.9 kg)   SpO2 97%   BMI 27.44 kg/m   Body mass index is 27.44 kg/m. Wt Readings from Last 3 Encounters:  05/21/22 145 lb 3.2 oz (65.9 kg)  12/16/21 152 lb 8 oz (69.2 kg)  11/24/21 151 lb 12.8 oz (68.9 kg)   Gen: NAD, resting comfortably HEENT: TMs normal bilaterally. OP clear. No thyromegaly noted.  CV: RRR with no murmurs appreciated Pulm: NWOB, CTAB with no crackles, wheezes, or rhonchi GI: Normal bowel sounds present. Soft, Nontender, Nondistended. MSK: no edema, cyanosis, or clubbing noted Skin: warm, dry Neuro: CN2-12 grossly intact. Strength 5/5 in upper and lower extremities. Reflexes symmetric and intact bilaterally.  Psych: Normal affect and thought content     Ilijah Doucet M. Jerline Pain, MD 05/21/2022 2:14 PM

## 2022-05-21 NOTE — Assessment & Plan Note (Signed)
Previous dermatologist is no longer practicing.  We will place referral to new dermatologist.

## 2022-05-22 NOTE — Progress Notes (Signed)
Please inform patient of the following:  Labs are all stable.  Do not need to make any changes to treatment plan at this time.  She should keep up the good work and we can recheck this in a year.

## 2022-05-29 ENCOUNTER — Encounter: Payer: Medicare Other | Admitting: Family Medicine

## 2022-06-29 ENCOUNTER — Other Ambulatory Visit: Payer: Self-pay | Admitting: Family Medicine

## 2022-07-01 DIAGNOSIS — H524 Presbyopia: Secondary | ICD-10-CM | POA: Diagnosis not present

## 2022-07-13 ENCOUNTER — Encounter: Payer: Self-pay | Admitting: *Deleted

## 2022-08-14 ENCOUNTER — Other Ambulatory Visit: Payer: Self-pay

## 2022-08-14 ENCOUNTER — Ambulatory Visit: Payer: Medicare Other | Admitting: Hematology and Oncology

## 2022-08-14 ENCOUNTER — Inpatient Hospital Stay: Payer: Medicare Other

## 2022-08-14 ENCOUNTER — Inpatient Hospital Stay: Payer: Medicare Other | Attending: Hematology and Oncology

## 2022-08-14 DIAGNOSIS — R7989 Other specified abnormal findings of blood chemistry: Secondary | ICD-10-CM | POA: Diagnosis not present

## 2022-08-14 DIAGNOSIS — E119 Type 2 diabetes mellitus without complications: Secondary | ICD-10-CM | POA: Insufficient documentation

## 2022-08-14 DIAGNOSIS — M8448XA Pathological fracture, other site, initial encounter for fracture: Secondary | ICD-10-CM | POA: Insufficient documentation

## 2022-08-14 LAB — IRON AND IRON BINDING CAPACITY (CC-WL,HP ONLY)
Iron: 153 ug/dL (ref 28–170)
Saturation Ratios: 43 % — ABNORMAL HIGH (ref 10.4–31.8)
TIBC: 358 ug/dL (ref 250–450)
UIBC: 205 ug/dL (ref 148–442)

## 2022-08-14 LAB — CMP (CANCER CENTER ONLY)
ALT: 54 U/L — ABNORMAL HIGH (ref 0–44)
AST: 45 U/L — ABNORMAL HIGH (ref 15–41)
Albumin: 4.8 g/dL (ref 3.5–5.0)
Alkaline Phosphatase: 83 U/L (ref 38–126)
Anion gap: 7 (ref 5–15)
BUN: 15 mg/dL (ref 8–23)
CO2: 30 mmol/L (ref 22–32)
Calcium: 9.6 mg/dL (ref 8.9–10.3)
Chloride: 103 mmol/L (ref 98–111)
Creatinine: 0.59 mg/dL (ref 0.44–1.00)
GFR, Estimated: >60 mL/min (ref >60)
Glucose, Bld: 125 mg/dL — ABNORMAL HIGH (ref 70–99)
Potassium: 4.4 mmol/L (ref 3.5–5.1)
Sodium: 140 mmol/L (ref 135–145)
Total Bilirubin: 1.1 mg/dL (ref 0.3–1.2)
Total Protein: 7.1 g/dL (ref 6.5–8.1)

## 2022-08-14 LAB — CBC WITH DIFFERENTIAL (CANCER CENTER ONLY)
Abs Immature Granulocytes: 0.02 10*3/uL (ref 0.00–0.07)
Basophils Absolute: 0 10*3/uL (ref 0.0–0.1)
Basophils Relative: 1 %
Eosinophils Absolute: 0.2 10*3/uL (ref 0.0–0.5)
Eosinophils Relative: 4 %
HCT: 41.2 % (ref 36.0–46.0)
Hemoglobin: 15.2 g/dL — ABNORMAL HIGH (ref 12.0–15.0)
Immature Granulocytes: 1 %
Lymphocytes Relative: 25 %
Lymphs Abs: 1 10*3/uL (ref 0.7–4.0)
MCH: 35.2 pg — ABNORMAL HIGH (ref 26.0–34.0)
MCHC: 36.9 g/dL — ABNORMAL HIGH (ref 30.0–36.0)
MCV: 95.4 fL (ref 80.0–100.0)
Monocytes Absolute: 0.3 10*3/uL (ref 0.1–1.0)
Monocytes Relative: 7 %
Neutro Abs: 2.5 10*3/uL (ref 1.7–7.7)
Neutrophils Relative %: 62 %
Platelet Count: 87 10*3/uL — ABNORMAL LOW (ref 150–400)
RBC: 4.32 MIL/uL (ref 3.87–5.11)
RDW: 12.5 % (ref 11.5–15.5)
WBC Count: 4 10*3/uL (ref 4.0–10.5)
nRBC: 0 % (ref 0.0–0.2)

## 2022-08-14 LAB — FERRITIN: Ferritin: 110 ng/mL (ref 11–307)

## 2022-08-14 NOTE — Progress Notes (Signed)
Patient Care Team: Vivi Barrack, MD as PCP - General (Family Medicine) Lavonna Monarch, MD (Inactive) as Consulting Physician (Dermatology)  DIAGNOSIS: No diagnosis found.  SUMMARY OF ONCOLOGIC HISTORY: Oncology History   No history exists.    CHIEF COMPLIANT:  Follow-up of hereditary hemochromatosis  INTERVAL HISTORY: [name] is a  71 y.o. with above-mentioned history of hereditary hemochromatosis on phlebotomies every 6 weeks. Labs on 12/15/2021 showed WBC 3.9, Hg 14.3, HCT 40.7,  platelets 92, iron saturation 16%, ferritin 29. The patient presents to the clinic today for follow-up.    ALLERGIES:  has No Known Allergies.  MEDICATIONS:  Current Outpatient Medications  Medication Sig Dispense Refill   albuterol (VENTOLIN HFA) 108 (90 Base) MCG/ACT inhaler 2 puffs as needed     amLODipine (NORVASC) 10 MG tablet Take 1 tablet (10 mg total) by mouth daily. 90 tablet 3   B Complex Vitamins (B COMPLEX 1 PO) Take by mouth.     cholecalciferol (VITAMIN D) 1000 units tablet Take 5,000 Units by mouth daily.     Homeopathic Products (LIVER SUPPORT) SUBL Place under the tongue.     losartan (COZAAR) 100 MG tablet TAKE ONE TABLET BY MOUTH DAILY 90 tablet 3   MYRBETRIQ 25 MG TB24 tablet TAKE ONE TABLET BY MOUTH DAILY 30 tablet 5   pravastatin (PRAVACHOL) 40 MG tablet TAKE ONE TABLET BY MOUTH DAILY 90 tablet 3   sertraline (ZOLOFT) 100 MG tablet TAKE ONE TABLET BY MOUTH DAILY 90 tablet 3   vitamin C (ASCORBIC ACID) 500 MG tablet Take 500 mg by mouth daily.     No current facility-administered medications for this visit.    PHYSICAL EXAMINATION: ECOG PERFORMANCE STATUS: {CHL ONC ECOG PS:[id number]}  There were no vitals filed for this visit. There were no vitals filed for this visit.  BREAST:*** No palpable masses or nodules in either right or left breasts. No palpable axillary supraclavicular or infraclavicular adenopathy no breast tenderness or nipple discharge. (exam  performed in the presence of a chaperone)  LABORATORY DATA:  I have reviewed the data as listed    Latest Ref Rng & Units 04/15/2022   10:52 AM 09/12/2021   10:22 AM 06/11/2021   12:46 PM  CMP  Glucose 70 - 99 mg/dL 164  139  201   BUN 8 - 23 mg/dL '14  10  14   '$ Creatinine 0.44 - 1.00 mg/dL 0.58  0.63  0.73   Sodium 135 - 145 mmol/L 139  142  141   Potassium 3.5 - 5.1 mmol/L 4.5  4.2  4.5   Chloride 98 - 111 mmol/L 104  108  105   CO2 22 - 32 mmol/L '28  24  26   '$ Calcium 8.9 - 10.3 mg/dL 9.3  8.7  9.6   Total Protein 6.5 - 8.1 g/dL 6.5  6.6  6.7   Total Bilirubin 0.3 - 1.2 mg/dL 0.9  0.6  0.9   Alkaline Phos 38 - 126 U/L 81  110  95   AST 15 - 41 U/L 36  41  56   ALT 0 - 44 U/L 42  51  60     Lab Results  Component Value Date   WBC 4.1 04/15/2022   HGB 15.2 (H) 04/15/2022   HCT 41.5 04/15/2022   MCV 94.1 04/15/2022   PLT 87 (L) 04/15/2022   NEUTROABS 2.7 04/15/2022    ASSESSMENT & PLAN:  No problem-specific Assessment & Plan notes found  for this encounter.    No orders of the defined types were placed in this encounter.  The patient has a good understanding of the overall plan. she agrees with it. she will call with any problems that may develop before the next visit here. Total time spent: 30 mins including face to face time and time spent for planning, charting and co-ordination of care   Suzzette Righter, [place] 08/14/22    I Gardiner Coins am scribing for Dr. Lindi Adie  ***

## 2022-08-14 NOTE — Patient Instructions (Signed)

## 2022-08-14 NOTE — Progress Notes (Signed)
Hillery Aldo Abelson presents today for phlebotomy per MD orders. Phlebotomy procedure started at 1003 and ended at 1010. 519 grams removed. 16 G needle set used Patient observed for 30 minutes after procedure without any incident. Patient tolerated procedure well. IV needle removed intact.

## 2022-08-17 ENCOUNTER — Other Ambulatory Visit: Payer: Self-pay

## 2022-08-17 ENCOUNTER — Inpatient Hospital Stay (HOSPITAL_BASED_OUTPATIENT_CLINIC_OR_DEPARTMENT_OTHER): Payer: Medicare Other | Admitting: Hematology and Oncology

## 2022-08-17 DIAGNOSIS — R7989 Other specified abnormal findings of blood chemistry: Secondary | ICD-10-CM | POA: Diagnosis not present

## 2022-08-17 DIAGNOSIS — M8448XA Pathological fracture, other site, initial encounter for fracture: Secondary | ICD-10-CM | POA: Diagnosis not present

## 2022-08-17 DIAGNOSIS — E119 Type 2 diabetes mellitus without complications: Secondary | ICD-10-CM | POA: Diagnosis not present

## 2022-08-17 NOTE — Assessment & Plan Note (Signed)
Elevated liver function tests with elevated ferritin 08/2017 Ferritin : 918.9, iron saturation 43% 01/17/2018 ferritin 124, iron saturation 22% 04/05/2019:Ferritin 180, iron saturation 38% 02/05/2020: Ferritin 71, iron saturation 20% 01/23/2021: Ferritin 105, iron saturation 33%, AST 102, ALT 126, alkaline phosphatase 165, bilirubin 0.8 04/15/2021: Ferritin 43, iron saturation 13%, hemoglobin 15.3, platelets 118 AST 76, ALT 93, hemoglobin A1c 7.4 06/11/2021: Ferritin 20, iron saturation 32%, hemoglobin 13.6, platelets 122, AST 56, ALT 60  12/15/2021: Ferritin 29, iron saturation 10%, hemoglobin 14.3, platelets 92 08/14/2022: Ferritin 110, iron saturation 43%, hemoglobin 15.2, platelets 87, AST 45, ALT 54  Diabetes: Her A1c has improved to 7.3(from 8.2)   Fracture right inferior pubic ramus: Limiting her activities. Weight issues: She continues to lose weight by eating healthier. Proximal muscle weakness: Instructed her to work on her muscles at [employer].  We will be switching her phlebotomy schedule to every12weeks I will see her back in 9 months day after the phlebotomy

## 2022-08-18 ENCOUNTER — Other Ambulatory Visit: Payer: Self-pay | Admitting: Family Medicine

## 2022-08-19 ENCOUNTER — Other Ambulatory Visit: Payer: Self-pay | Admitting: Family Medicine

## 2022-08-19 DIAGNOSIS — Z1231 Encounter for screening mammogram for malignant neoplasm of breast: Secondary | ICD-10-CM

## 2022-09-12 ENCOUNTER — Other Ambulatory Visit: Payer: Self-pay | Admitting: Family Medicine

## 2022-09-21 DIAGNOSIS — L570 Actinic keratosis: Secondary | ICD-10-CM | POA: Diagnosis not present

## 2022-09-21 DIAGNOSIS — L738 Other specified follicular disorders: Secondary | ICD-10-CM | POA: Diagnosis not present

## 2022-10-01 ENCOUNTER — Encounter: Payer: Self-pay | Admitting: *Deleted

## 2022-10-06 DIAGNOSIS — J42 Unspecified chronic bronchitis: Secondary | ICD-10-CM | POA: Diagnosis not present

## 2022-10-06 DIAGNOSIS — Z20822 Contact with and (suspected) exposure to covid-19: Secondary | ICD-10-CM | POA: Diagnosis not present

## 2022-10-06 DIAGNOSIS — R059 Cough, unspecified: Secondary | ICD-10-CM | POA: Diagnosis not present

## 2022-10-16 ENCOUNTER — Ambulatory Visit
Admission: RE | Admit: 2022-10-16 | Discharge: 2022-10-16 | Disposition: A | Discharge status: Home / Self Care | Payer: Medicare Other | Source: Ambulatory Visit | Attending: Family Medicine | Admitting: Family Medicine

## 2022-10-16 DIAGNOSIS — Z1231 Encounter for screening mammogram for malignant neoplasm of breast: Secondary | ICD-10-CM

## 2022-10-26 DIAGNOSIS — L738 Other specified follicular disorders: Secondary | ICD-10-CM | POA: Diagnosis not present

## 2022-10-26 DIAGNOSIS — L905 Scar conditions and fibrosis of skin: Secondary | ICD-10-CM | POA: Diagnosis not present

## 2022-10-26 DIAGNOSIS — L570 Actinic keratosis: Secondary | ICD-10-CM | POA: Diagnosis not present

## 2022-10-28 ENCOUNTER — Encounter: Payer: Self-pay | Admitting: Hematology and Oncology

## 2022-11-02 ENCOUNTER — Encounter: Payer: Self-pay | Admitting: Hematology and Oncology

## 2022-11-05 DIAGNOSIS — K080 Exfoliation of teeth due to systemic causes: Secondary | ICD-10-CM | POA: Diagnosis not present

## 2022-11-09 ENCOUNTER — Other Ambulatory Visit: Payer: Self-pay | Admitting: Hematology and Oncology

## 2022-11-09 ENCOUNTER — Other Ambulatory Visit: Payer: Self-pay

## 2022-11-09 ENCOUNTER — Inpatient Hospital Stay: Payer: Medicare Other

## 2022-11-09 ENCOUNTER — Inpatient Hospital Stay: Payer: Medicare Other | Attending: Hematology and Oncology

## 2022-11-09 LAB — FERRITIN: Ferritin: 82 ng/mL (ref 11–307)

## 2022-11-09 NOTE — Patient Instructions (Signed)

## 2022-11-09 NOTE — Progress Notes (Signed)
[name] presents today for phlebotomy per MD orders. Phlebotomy procedure started at 0936 and ended at 1000. 499 cc removed. Patient tolerated procedure well. Beverage and snack provided. Per pt request, Pt remained for 20 min observation IV needle removed intact. VSS and pt discharged in stable condition

## 2022-11-27 DIAGNOSIS — J209 Acute bronchitis, unspecified: Secondary | ICD-10-CM | POA: Diagnosis not present

## 2022-11-27 DIAGNOSIS — R051 Acute cough: Secondary | ICD-10-CM | POA: Diagnosis not present

## 2022-12-08 ENCOUNTER — Telehealth: Payer: Self-pay | Admitting: Family Medicine

## 2022-12-08 NOTE — Telephone Encounter (Signed)
Called patient to schedule Medicare Annual Wellness Visit (AWV). Left message for patient to call back and schedule Medicare Annual Wellness Visit (AWV).  Last date of AWV: 10/27/2021  Please schedule an appointment at any time with Otila Kluver, [facility].  If any questions, please contact me at [phone].  Thank you ,  Shaune Pollack [facility] AWV TEAM Direct Dial [phone]

## 2022-12-09 ENCOUNTER — Telehealth: Payer: Self-pay | Admitting: Family Medicine

## 2022-12-09 NOTE — Telephone Encounter (Signed)
Called patient to schedule Medicare Annual Wellness Visit (AWV). Left message for patient to call back and schedule Medicare Annual Wellness Visit (AWV).  Last date of AWV: 10/27/2021  Please schedule an appointment at any time with Otila Kluver, [facility].  If any questions, please contact me at [phone].  Thank you ,  Shaune Pollack [facility] AWV TEAM Direct Dial [phone]

## 2022-12-22 ENCOUNTER — Telehealth: Payer: Self-pay | Admitting: Family Medicine

## 2022-12-22 NOTE — Telephone Encounter (Signed)
Copied from [place] [id number]. Topic: Medicare AWV >> Dec 22, 2022 12:02 PM Gillis Santa wrote: Reason for CRM: Called patient to schedule Medicare Annual Wellness Visit (AWV). Left message for patient to call back and schedule Medicare Annual Wellness Visit (AWV).  Last date of AWV: 10/27/2021  Please schedule an appointment at any time with Otila Kluver, [facility].  If any questions, please contact me at [phone].  Thank you ,  Shaune Pollack [facility] AWV TEAM Direct Dial [phone]

## 2023-01-14 DIAGNOSIS — K080 Exfoliation of teeth due to systemic causes: Secondary | ICD-10-CM | POA: Diagnosis not present

## 2023-01-27 DIAGNOSIS — I10 Essential (primary) hypertension: Secondary | ICD-10-CM | POA: Diagnosis not present

## 2023-01-27 DIAGNOSIS — E785 Hyperlipidemia, unspecified: Secondary | ICD-10-CM | POA: Diagnosis not present

## 2023-01-27 DIAGNOSIS — Z1329 Encounter for screening for other suspected endocrine disorder: Secondary | ICD-10-CM | POA: Diagnosis not present

## 2023-01-27 DIAGNOSIS — Z130 Encounter for screening for diseases of the blood and blood-forming organs and certain disorders involving the immune mechanism: Secondary | ICD-10-CM | POA: Diagnosis not present

## 2023-01-27 DIAGNOSIS — Z0000 Encounter for general adult medical examination without abnormal findings: Secondary | ICD-10-CM | POA: Diagnosis not present

## 2023-01-27 DIAGNOSIS — J449 Chronic obstructive pulmonary disease, unspecified: Secondary | ICD-10-CM | POA: Diagnosis not present

## 2023-01-27 DIAGNOSIS — Z122 Encounter for screening for malignant neoplasm of respiratory organs: Secondary | ICD-10-CM | POA: Diagnosis not present

## 2023-02-01 ENCOUNTER — Inpatient Hospital Stay: Payer: Medicare Other

## 2023-02-01 ENCOUNTER — Inpatient Hospital Stay: Payer: Medicare Other | Attending: Hematology and Oncology

## 2023-02-01 DIAGNOSIS — M47819 Spondylosis without myelopathy or radiculopathy, site unspecified: Secondary | ICD-10-CM | POA: Diagnosis not present

## 2023-02-01 DIAGNOSIS — G8929 Other chronic pain: Secondary | ICD-10-CM | POA: Diagnosis not present

## 2023-02-01 DIAGNOSIS — M4316 Spondylolisthesis, lumbar region: Secondary | ICD-10-CM | POA: Diagnosis not present

## 2023-02-01 DIAGNOSIS — M545 Low back pain, unspecified: Secondary | ICD-10-CM | POA: Diagnosis not present

## 2023-02-01 LAB — CMP (CANCER CENTER ONLY)
ALT: 39 U/L (ref 0–44)
AST: 32 U/L (ref 15–41)
Albumin: 4.5 g/dL (ref 3.5–5.0)
Alkaline Phosphatase: 94 U/L (ref 38–126)
Anion gap: 6 (ref 5–15)
BUN: 14 mg/dL (ref 8–23)
CO2: 31 mmol/L (ref 22–32)
Calcium: 10.1 mg/dL (ref 8.9–10.3)
Chloride: 103 mmol/L (ref 98–111)
Creatinine: 0.62 mg/dL (ref 0.44–1.00)
GFR, Estimated: >60 mL/min (ref >60)
Glucose, Bld: 191 mg/dL — ABNORMAL HIGH (ref 70–99)
Potassium: 4.7 mmol/L (ref 3.5–5.1)
Sodium: 140 mmol/L (ref 135–145)
Total Bilirubin: 0.8 mg/dL (ref 0.3–1.2)
Total Protein: 6.5 g/dL (ref 6.5–8.1)

## 2023-02-01 LAB — CBC WITH DIFFERENTIAL (CANCER CENTER ONLY)
Abs Immature Granulocytes: 0.01 10*3/uL (ref 0.00–0.07)
Basophils Absolute: 0 10*3/uL (ref 0.0–0.1)
Basophils Relative: 1 %
Eosinophils Absolute: 0.2 10*3/uL (ref 0.0–0.5)
Eosinophils Relative: 4 %
HCT: 40.6 % (ref 36.0–46.0)
Hemoglobin: 14.7 g/dL (ref 12.0–15.0)
Immature Granulocytes: 0 %
Lymphocytes Relative: 24 %
Lymphs Abs: 1 10*3/uL (ref 0.7–4.0)
MCH: 34.8 pg — ABNORMAL HIGH (ref 26.0–34.0)
MCHC: 36.2 g/dL — ABNORMAL HIGH (ref 30.0–36.0)
MCV: 96.2 fL (ref 80.0–100.0)
Monocytes Absolute: 0.3 10*3/uL (ref 0.1–1.0)
Monocytes Relative: 8 %
Neutro Abs: 2.6 10*3/uL (ref 1.7–7.7)
Neutrophils Relative %: 63 %
Platelet Count: 94 10*3/uL — ABNORMAL LOW (ref 150–400)
RBC: 4.22 MIL/uL (ref 3.87–5.11)
RDW: 12.2 % (ref 11.5–15.5)
WBC Count: 4.1 10*3/uL (ref 4.0–10.5)
nRBC: 0 % (ref 0.0–0.2)

## 2023-02-01 LAB — IRON AND IRON BINDING CAPACITY (CC-WL,HP ONLY)
Iron: 115 ug/dL (ref 28–170)
Saturation Ratios: 34 % — ABNORMAL HIGH (ref 10.4–31.8)
TIBC: 337 ug/dL (ref 250–450)
UIBC: 222 ug/dL (ref 148–442)

## 2023-02-01 LAB — FERRITIN: Ferritin: 85 ng/mL (ref 11–307)

## 2023-02-01 NOTE — Patient Instructions (Signed)

## 2023-02-01 NOTE — Progress Notes (Signed)
Pt here at 0935 for therapeutic phlebotomy started at 0955 and ended at 1005. Removed 514 gm. Pt tolerated procedure well, snack and beverage offered to patient. Removed 16G iv catheter.  Pt stayed  until 1035 for post observation.  Vital signs stable at discharge. Ambulated without difficulty.

## 2023-02-02 NOTE — Progress Notes (Signed)
HEMATOLOGY-ONCOLOGY TELEPHONE VISIT PROGRESS NOTE  I connected with our patient on 02/04/23 at  2:15 PM EDT by telephone and verified that I am speaking with the correct person using two identifiers.  I discussed the limitations, risks, security and privacy concerns of performing an evaluation and management service by telephone and the availability of in person appointments.  I also discussed with the patient that there may be a patient responsible charge related to this service. The patient expressed understanding and agreed to proceed.   History of Present Illness:  [name] is a  72 y.o. with above-mentioned history of hereditary. She presents to the clinic for a telephone follow-up to discuss phlebotomy.Back issues being evaluated by spine center.  REVIEW OF SYSTEMS:   Constitutional: Denies fevers, chills or abnormal weight loss All other systems were reviewed with the patient and are negative. Observations/Objective:     Assessment Plan:  Hereditary hemochromatosis (HCC) Elevated liver function tests with elevated ferritin 08/2017 Ferritin : 918.9, iron saturation 43% 01/17/2018 ferritin 124, iron saturation 22% 04/05/2019: Ferritin 180, iron saturation 38% 02/05/2020: Ferritin 71, iron saturation 20% 01/23/2021: Ferritin 105, iron saturation 33%, AST 102, ALT 126, alkaline phosphatase 165, bilirubin 0.8 04/15/2021: Ferritin 43, iron saturation 13%, hemoglobin 15.3, platelets 118 AST 76, ALT 93, hemoglobin A1c 7.4 06/11/2021: Ferritin 20, iron saturation 32%, hemoglobin 13.6, platelets 122, AST 56, ALT 60   12/15/2021: Ferritin 29, iron saturation 10%, hemoglobin 14.3, platelets 92 08/14/2022: Ferritin 110, iron saturation 43%, hemoglobin 15.2, platelets 87, AST 45, ALT 54 02/01/2023: Hemoglobin 14.7, platelets 94, AST 32, ALT 39, iron saturation 34%, ferritin 85   Diabetes: Her A1c has improved to 5.2 (from 8.2)    Fracture right inferior pubic ramus: Limiting her  activities. Weight issues: She lost 20 pounds by eating healthy. Back pain: being evaluated (at Spine center) : Dr.Sebastian   Continue phlebotomy schedule every 12 weeks  I will see her back in 6 months day after the phlebotomy with a telephone visit    I discussed the assessment and treatment plan with the patient. The patient was provided an opportunity to ask questions and all were answered. The patient agreed with the plan and demonstrated an understanding of the instructions. The patient was advised to call back or seek an in-person evaluation if the symptoms worsen or if the condition fails to improve as anticipated.   I provided 12 minutes of non-face-to-face time during this encounter.  This includes time for charting and coordination of care   Tamsen Meek, MD  I Janan Ridge am acting as a scribe for Dr.Shalea Tomczak  I have reviewed the above documentation for accuracy and completeness, and I agree with the above.

## 2023-02-03 ENCOUNTER — Inpatient Hospital Stay: Payer: Medicare Other | Admitting: Hematology and Oncology

## 2023-02-04 ENCOUNTER — Inpatient Hospital Stay (HOSPITAL_BASED_OUTPATIENT_CLINIC_OR_DEPARTMENT_OTHER): Payer: Medicare Other | Admitting: Hematology and Oncology

## 2023-02-04 NOTE — Assessment & Plan Note (Signed)
Elevated liver function tests with elevated ferritin 08/2017 Ferritin : 918.9, iron saturation 43% 01/17/2018 ferritin 124, iron saturation 22% 04/05/2019: Ferritin 180, iron saturation 38% 02/05/2020: Ferritin 71, iron saturation 20% 01/23/2021: Ferritin 105, iron saturation 33%, AST 102, ALT 126, alkaline phosphatase 165, bilirubin 0.8 04/15/2021: Ferritin 43, iron saturation 13%, hemoglobin 15.3, platelets 118 AST 76, ALT 93, hemoglobin A1c 7.4 06/11/2021: Ferritin 20, iron saturation 32%, hemoglobin 13.6, platelets 122, AST 56, ALT 60   12/15/2021: Ferritin 29, iron saturation 10%, hemoglobin 14.3, platelets 92 08/14/2022: Ferritin 110, iron saturation 43%, hemoglobin 15.2, platelets 87, AST 45, ALT 54 02/01/2023: Hemoglobin 14.7, platelets 94, AST 32, ALT 39, iron saturation 34%, ferritin 85   Diabetes: Her A1c has improved to 7.3 (from 8.2)     Fracture right inferior pubic ramus: Limiting her activities. Weight issues: She lost 20 pounds by eating healthy. Proximal muscle weakness: Instructed her to work on her muscles at [employer].   Continue phlebotomy schedule every 12 weeks  I will see her back in 6 months day after the phlebotomy with a telephone visit

## 2023-02-08 ENCOUNTER — Telehealth: Payer: Self-pay | Admitting: Hematology and Oncology

## 2023-02-08 NOTE — Telephone Encounter (Signed)
Scheduled appointments per 4/18 los. Left voicemail.

## 2023-02-15 DIAGNOSIS — M47816 Spondylosis without myelopathy or radiculopathy, lumbar region: Secondary | ICD-10-CM | POA: Diagnosis not present

## 2023-02-16 DIAGNOSIS — Z87891 Personal history of nicotine dependence: Secondary | ICD-10-CM | POA: Diagnosis not present

## 2023-02-19 DIAGNOSIS — K080 Exfoliation of teeth due to systemic causes: Secondary | ICD-10-CM | POA: Diagnosis not present

## 2023-02-25 DIAGNOSIS — D1779 Benign lipomatous neoplasm of other sites: Secondary | ICD-10-CM | POA: Diagnosis not present

## 2023-02-25 DIAGNOSIS — M4807 Spinal stenosis, lumbosacral region: Secondary | ICD-10-CM | POA: Diagnosis not present

## 2023-02-25 DIAGNOSIS — Q7649 Other congenital malformations of spine, not associated with scoliosis: Secondary | ICD-10-CM | POA: Diagnosis not present

## 2023-02-25 DIAGNOSIS — M48061 Spinal stenosis, lumbar region without neurogenic claudication: Secondary | ICD-10-CM | POA: Diagnosis not present

## 2023-03-02 DIAGNOSIS — M47816 Spondylosis without myelopathy or radiculopathy, lumbar region: Secondary | ICD-10-CM | POA: Diagnosis not present

## 2023-03-12 DIAGNOSIS — M47816 Spondylosis without myelopathy or radiculopathy, lumbar region: Secondary | ICD-10-CM | POA: Diagnosis not present

## 2023-03-20 DIAGNOSIS — J209 Acute bronchitis, unspecified: Secondary | ICD-10-CM | POA: Diagnosis not present

## 2023-03-20 DIAGNOSIS — R059 Cough, unspecified: Secondary | ICD-10-CM | POA: Diagnosis not present

## 2023-03-20 DIAGNOSIS — Z20822 Contact with and (suspected) exposure to covid-19: Secondary | ICD-10-CM | POA: Diagnosis not present

## 2023-03-22 DIAGNOSIS — F419 Anxiety disorder, unspecified: Secondary | ICD-10-CM | POA: Diagnosis not present

## 2023-03-22 DIAGNOSIS — R051 Acute cough: Secondary | ICD-10-CM | POA: Diagnosis not present

## 2023-03-22 DIAGNOSIS — J40 Bronchitis, not specified as acute or chronic: Secondary | ICD-10-CM | POA: Diagnosis not present

## 2023-03-22 DIAGNOSIS — Q349 Congenital malformation of respiratory system, unspecified: Secondary | ICD-10-CM | POA: Diagnosis not present

## 2023-04-08 DIAGNOSIS — R222 Localized swelling, mass and lump, trunk: Secondary | ICD-10-CM | POA: Diagnosis not present

## 2023-04-08 DIAGNOSIS — M898X8 Other specified disorders of bone, other site: Secondary | ICD-10-CM | POA: Diagnosis not present

## 2023-04-15 DIAGNOSIS — E785 Hyperlipidemia, unspecified: Secondary | ICD-10-CM | POA: Diagnosis not present

## 2023-04-15 DIAGNOSIS — J449 Chronic obstructive pulmonary disease, unspecified: Secondary | ICD-10-CM | POA: Diagnosis not present

## 2023-04-15 DIAGNOSIS — M47816 Spondylosis without myelopathy or radiculopathy, lumbar region: Secondary | ICD-10-CM | POA: Diagnosis not present

## 2023-04-15 DIAGNOSIS — I10 Essential (primary) hypertension: Secondary | ICD-10-CM | POA: Diagnosis not present

## 2023-05-03 ENCOUNTER — Inpatient Hospital Stay: Payer: Medicare Other | Attending: Hematology and Oncology

## 2023-05-03 ENCOUNTER — Inpatient Hospital Stay: Payer: Medicare Other

## 2023-05-03 ENCOUNTER — Other Ambulatory Visit: Payer: Self-pay

## 2023-05-03 DIAGNOSIS — M5124 Other intervertebral disc displacement, thoracic region: Secondary | ICD-10-CM | POA: Diagnosis not present

## 2023-05-03 DIAGNOSIS — M47814 Spondylosis without myelopathy or radiculopathy, thoracic region: Secondary | ICD-10-CM | POA: Diagnosis not present

## 2023-05-03 LAB — CMP (CANCER CENTER ONLY)
ALT: 40 U/L (ref 0–44)
AST: 37 U/L (ref 15–41)
Albumin: 4.6 g/dL (ref 3.5–5.0)
Alkaline Phosphatase: 87 U/L (ref 38–126)
Anion gap: 9 (ref 5–15)
BUN: 14 mg/dL (ref 8–23)
CO2: 27 mmol/L (ref 22–32)
Calcium: 9.2 mg/dL (ref 8.9–10.3)
Chloride: 102 mmol/L (ref 98–111)
Creatinine: 0.64 mg/dL (ref 0.44–1.00)
GFR, Estimated: >60 mL/min (ref >60)
Glucose, Bld: 143 mg/dL — ABNORMAL HIGH (ref 70–99)
Potassium: 3.9 mmol/L (ref 3.5–5.1)
Sodium: 138 mmol/L (ref 135–145)
Total Bilirubin: 0.8 mg/dL (ref 0.3–1.2)
Total Protein: 6.8 g/dL (ref 6.5–8.1)

## 2023-05-03 LAB — CBC WITH DIFFERENTIAL (CANCER CENTER ONLY)
Abs Immature Granulocytes: 0.02 10*3/uL (ref 0.00–0.07)
Basophils Absolute: 0 10*3/uL (ref 0.0–0.1)
Basophils Relative: 1 %
Eosinophils Absolute: 0.2 10*3/uL (ref 0.0–0.5)
Eosinophils Relative: 4 %
HCT: 42.2 % (ref 36.0–46.0)
Hemoglobin: 15.2 g/dL — ABNORMAL HIGH (ref 12.0–15.0)
Immature Granulocytes: 1 %
Lymphocytes Relative: 26 %
Lymphs Abs: 1.1 10*3/uL (ref 0.7–4.0)
MCH: 34 pg (ref 26.0–34.0)
MCHC: 36 g/dL (ref 30.0–36.0)
MCV: 94.4 fL (ref 80.0–100.0)
Monocytes Absolute: 0.3 10*3/uL (ref 0.1–1.0)
Monocytes Relative: 8 %
Neutro Abs: 2.5 10*3/uL (ref 1.7–7.7)
Neutrophils Relative %: 60 %
Platelet Count: 84 10*3/uL — ABNORMAL LOW (ref 150–400)
RBC: 4.47 MIL/uL (ref 3.87–5.11)
RDW: 12.5 % (ref 11.5–15.5)
WBC Count: 4.1 10*3/uL (ref 4.0–10.5)
nRBC: 0 % (ref 0.0–0.2)

## 2023-05-03 LAB — FERRITIN: Ferritin: 131 ng/mL (ref 11–307)

## 2023-05-03 LAB — IRON AND IRON BINDING CAPACITY (CC-WL,HP ONLY)
Iron: 147 ug/dL (ref 28–170)
Saturation Ratios: 43 % — ABNORMAL HIGH (ref 10.4–31.8)
TIBC: 346 ug/dL (ref 250–450)
UIBC: 199 ug/dL (ref 148–442)

## 2023-05-03 NOTE — Progress Notes (Signed)
[name] presents today for phlebotomy per MD orders. Phlebotomy procedure started at 0933 and ended at 0937. 16g phlebotomy kit used. 530 grams removed. Patient observed for 10 minutes after procedure per patient choice without any incident. Patient tolerated procedure well. VSS at discharge. IV needle removed intact.

## 2023-05-06 ENCOUNTER — Telehealth: Payer: Self-pay | Admitting: *Deleted

## 2023-05-06 NOTE — Telephone Encounter (Signed)
[name] called. Dr. Pamelia Hoit sees her for hemachromatosis and she has phlebotomies at [facility], most recently 05/03/23. She is seen for back pain and receives treatment for same at [facility]. She had MRI on 05/03/23 that showed lesions on thoracic spine. She wants to see Dr. Pamelia Hoit and discuss this so has made appt with him for 05/31/23. She asked that he be informed about the MRI and why she wants the appointment. Appointment note entered with this information to inform MD.

## 2023-05-11 DIAGNOSIS — M48061 Spinal stenosis, lumbar region without neurogenic claudication: Secondary | ICD-10-CM | POA: Diagnosis not present

## 2023-05-11 DIAGNOSIS — M5136 Other intervertebral disc degeneration, lumbar region: Secondary | ICD-10-CM | POA: Diagnosis not present

## 2023-05-11 DIAGNOSIS — M545 Low back pain, unspecified: Secondary | ICD-10-CM | POA: Diagnosis not present

## 2023-05-11 DIAGNOSIS — M5416 Radiculopathy, lumbar region: Secondary | ICD-10-CM | POA: Diagnosis not present

## 2023-05-17 DIAGNOSIS — M5416 Radiculopathy, lumbar region: Secondary | ICD-10-CM | POA: Diagnosis not present

## 2023-05-18 ENCOUNTER — Other Ambulatory Visit: Payer: Self-pay | Admitting: Family Medicine

## 2023-05-24 ENCOUNTER — Encounter: Payer: Medicare Other | Admitting: Family Medicine

## 2023-05-25 NOTE — Progress Notes (Signed)
Patient Care Team: Ardith Dark, MD as PCP - General (Family Medicine) Janalyn Harder, MD (Inactive) as Consulting Physician (Dermatology)  DIAGNOSIS:  Encounter Diagnosis  Name Primary?   Hereditary hemochromatosis (HCC) Yes    CHIEF COMPLIANT: Follow-up of hereditary hemochromatosis   INTERVAL HISTORY: [name] is a 72 y.o. with above-mentioned history of hereditary hemochromatosis on phlebotomies every 3 months. She presents to the clinic for a follow-up.  She has had chronic back issues and underwent extensive testing including MRIs which showed a paraspinal soft tissue growth which the MRI reading suggested could be extramedullary hematopoiesis or a extension of a hemangioma.  This has been described over the past 5 years on her CT scans that she had been doing for lung cancer surveillance.  This has been unchanged for over 5 years and therefore it is felt to be benign.  She does not have any pain or discomfort at the site of this paraspinal growth.  Occasionally she has itching sensation but that is all mostly skin related stuff. She has low back pain at L4-L5 and is seeing orthopedics for that.  ALLERGIES:  has No Known Allergies.  MEDICATIONS:  Current Outpatient Medications  Medication Sig Dispense Refill   albuterol (VENTOLIN HFA) 108 (90 Base) MCG/ACT inhaler 2 puffs as needed     amLODipine (NORVASC) 10 MG tablet TAKE 1 TABLET BY MOUTH DAILY 90 tablet 1   B Complex Vitamins (B COMPLEX 1 PO) Take by mouth.     cholecalciferol (VITAMIN D) 1000 units tablet Take 5,000 Units by mouth daily.     Homeopathic Products (LIVER SUPPORT) SUBL Place under the tongue.     losartan (COZAAR) 100 MG tablet TAKE ONE TABLET BY MOUTH DAILY 90 tablet 3   MYRBETRIQ 25 MG TB24 tablet TAKE ONE TABLET BY MOUTH DAILY 30 tablet 5   pravastatin (PRAVACHOL) 40 MG tablet TAKE ONE TABLET BY MOUTH DAILY 90 tablet 3   sertraline (ZOLOFT) 100 MG tablet TAKE ONE TABLET BY MOUTH DAILY 90 tablet 3    vitamin C (ASCORBIC ACID) 500 MG tablet Take 500 mg by mouth daily.     No current facility-administered medications for this visit.    PHYSICAL EXAMINATION: ECOG PERFORMANCE STATUS: 1 - Symptomatic but completely ambulatory  Vitals:   05/31/23 0821  BP: 133/68  Pulse: 88  Resp: 16  Temp: 97.7 F (36.5 C)  SpO2: 93%   Filed Weights   05/31/23 0821  Weight: 151 lb 1.6 oz (68.5 kg)      LABORATORY DATA:  I have reviewed the data as listed    Latest Ref Rng & Units 05/03/2023    8:55 AM 02/01/2023    9:04 AM 08/14/2022    9:14 AM  CMP  Glucose 70 - 99 mg/dL 272  536  644   BUN 8 - 23 mg/dL 14  14  15    Creatinine 0.44 - 1.00 mg/dL 0.34  7.42  5.95   Sodium 135 - 145 mmol/L 138  140  140   Potassium 3.5 - 5.1 mmol/L 3.9  4.7  4.4   Chloride 98 - 111 mmol/L 102  103  103   CO2 22 - 32 mmol/L 27  31  30    Calcium 8.9 - 10.3 mg/dL 9.2  63.8  9.6   Total Protein 6.5 - 8.1 g/dL 6.8  6.5  7.1   Total Bilirubin 0.3 - 1.2 mg/dL 0.8  0.8  1.1   Alkaline Phos 38 -  126 U/L 87  94  83   AST 15 - 41 U/L 37  32  45   ALT 0 - 44 U/L 40  39  54     Lab Results  Component Value Date   WBC 4.1 05/03/2023   HGB 15.2 (H) 05/03/2023   HCT 42.2 05/03/2023   MCV 94.4 05/03/2023   PLT 84 (L) 05/03/2023   NEUTROABS 2.5 05/03/2023    ASSESSMENT & PLAN:  Hereditary hemochromatosis (HCC) Elevated liver function tests with elevated ferritin 08/2017 Ferritin : 918.9, iron saturation 43% 01/17/2018 ferritin 124, iron saturation 22% 04/05/2019: Ferritin 180, iron saturation 38% 01/23/2021: Ferritin 105, iron saturation 33%, AST 102, ALT 126, alkaline phosphatase 165, bilirubin 0.8 04/15/2021: Ferritin 43, iron saturation 13%, hemoglobin 15.3, platelets 118 AST 76, ALT 93, hemoglobin A1c 7.4 12/15/2021: Ferritin 29, iron saturation 10%, hemoglobin 14.3, platelets 92 08/14/2022: Ferritin 110, iron saturation 43%, hemoglobin 15.2, platelets 87, AST 45, ALT 54 02/01/2023: Hemoglobin 14.7, platelets  94, AST 32, ALT 39, iron saturation 34%, ferritin 85 05/03/2023: Hemoglobin 15.2, platelets 84, AST 37, ALT 40, iron saturation 43%, ferritin 131   Diabetes: Her A1c has improved to 5.2 (from 8.2)  Weight issues: She lost 20 pounds by eating healthy. Back pain: being evaluated (at Spine center) : Dr.Sebastian   I recommended changing phlebotomy to every 8 weeks (due to rise in iron saturation and ferritin levels)  Right paraspinal soft tissue mass: Patient has been referred back for discussion regarding this.  This was noted on the recent MRI of the spine at [facility].  I discussed with her that upon further review she has had this paraspinal soft tissue mass going on all the way even in 2018 and that there has not been any change in it and therefore is felt to be benign.  She is asymptomatic and hence does not require neurosurgery consultation at this time.  I will see her back in 6 months day after the phlebotomy with a telephone visit    No orders of the defined types were placed in this encounter.  The patient has a good understanding of the overall plan. she agrees with it. she will call with any problems that may develop before the next visit here. Total time spent: 30 mins including face to face time and time spent for planning, charting and co-ordination of care   Tamsen Meek, MD 05/31/23    I Janan Ridge am acting as a Neurosurgeon for [employer]  I have reviewed the above documentation for accuracy and completeness, and I agree with the above.

## 2023-05-31 ENCOUNTER — Inpatient Hospital Stay: Payer: Medicare Other | Attending: Hematology and Oncology | Admitting: Hematology and Oncology

## 2023-05-31 ENCOUNTER — Other Ambulatory Visit: Payer: Self-pay

## 2023-05-31 DIAGNOSIS — M545 Low back pain, unspecified: Secondary | ICD-10-CM | POA: Diagnosis not present

## 2023-05-31 DIAGNOSIS — R222 Localized swelling, mass and lump, trunk: Secondary | ICD-10-CM | POA: Insufficient documentation

## 2023-05-31 DIAGNOSIS — R7989 Other specified abnormal findings of blood chemistry: Secondary | ICD-10-CM | POA: Diagnosis not present

## 2023-05-31 DIAGNOSIS — E119 Type 2 diabetes mellitus without complications: Secondary | ICD-10-CM | POA: Insufficient documentation

## 2023-05-31 MED ORDER — PREGABALIN 25 MG PO CAPS: 25.0000 mg | ORAL_CAPSULE | Freq: Two times a day (BID) | ORAL | Status: DC

## 2023-05-31 NOTE — Assessment & Plan Note (Addendum)
Elevated liver function tests with elevated ferritin 08/2017 Ferritin : 918.9, iron saturation 43% 01/17/2018 ferritin 124, iron saturation 22% 04/05/2019: Ferritin 180, iron saturation 38% 01/23/2021: Ferritin 105, iron saturation 33%, AST 102, ALT 126, alkaline phosphatase 165, bilirubin 0.8 04/15/2021: Ferritin 43, iron saturation 13%, hemoglobin 15.3, platelets 118 AST 76, ALT 93, hemoglobin A1c 7.4 12/15/2021: Ferritin 29, iron saturation 10%, hemoglobin 14.3, platelets 92 08/14/2022: Ferritin 110, iron saturation 43%, hemoglobin 15.2, platelets 87, AST 45, ALT 54 02/01/2023: Hemoglobin 14.7, platelets 94, AST 32, ALT 39, iron saturation 34%, ferritin 85 05/03/2023: Hemoglobin 15.2, platelets 84, AST 37, ALT 40, iron saturation 43%, ferritin 131   Diabetes: Her A1c has improved to 5.2 (from 8.2)  Weight issues: She lost 20 pounds by eating healthy. Back pain: being evaluated (at [facility] center) : Dr.Sebastian   I recommended changing phlebotomy to every 8 weeks (due to rise in iron saturation and ferritin levels)   I will see her back in 6 months day after the phlebotomy with a telephone visit

## 2023-06-01 DIAGNOSIS — M810 Age-related osteoporosis without current pathological fracture: Secondary | ICD-10-CM | POA: Diagnosis not present

## 2023-06-15 DIAGNOSIS — M5416 Radiculopathy, lumbar region: Secondary | ICD-10-CM | POA: Diagnosis not present

## 2023-06-15 DIAGNOSIS — M48061 Spinal stenosis, lumbar region without neurogenic claudication: Secondary | ICD-10-CM | POA: Diagnosis not present

## 2023-06-23 DIAGNOSIS — F32A Depression, unspecified: Secondary | ICD-10-CM | POA: Diagnosis not present

## 2023-06-23 DIAGNOSIS — R051 Acute cough: Secondary | ICD-10-CM | POA: Diagnosis not present

## 2023-06-23 DIAGNOSIS — J029 Acute pharyngitis, unspecified: Secondary | ICD-10-CM | POA: Diagnosis not present

## 2023-06-23 DIAGNOSIS — J208 Acute bronchitis due to other specified organisms: Secondary | ICD-10-CM | POA: Diagnosis not present

## 2023-06-28 DIAGNOSIS — M5416 Radiculopathy, lumbar region: Secondary | ICD-10-CM | POA: Diagnosis not present

## 2023-06-28 DIAGNOSIS — M5136 Other intervertebral disc degeneration, lumbar region: Secondary | ICD-10-CM | POA: Diagnosis not present

## 2023-06-28 DIAGNOSIS — M48062 Spinal stenosis, lumbar region with neurogenic claudication: Secondary | ICD-10-CM | POA: Diagnosis not present

## 2023-06-29 DIAGNOSIS — M48062 Spinal stenosis, lumbar region with neurogenic claudication: Secondary | ICD-10-CM | POA: Diagnosis not present

## 2023-06-29 DIAGNOSIS — M47816 Spondylosis without myelopathy or radiculopathy, lumbar region: Secondary | ICD-10-CM | POA: Diagnosis not present

## 2023-06-29 DIAGNOSIS — M5136 Other intervertebral disc degeneration, lumbar region: Secondary | ICD-10-CM | POA: Diagnosis not present

## 2023-07-06 ENCOUNTER — Inpatient Hospital Stay: Payer: Medicare Other

## 2023-07-06 ENCOUNTER — Telehealth: Payer: Self-pay

## 2023-07-06 NOTE — Telephone Encounter (Signed)
RN called patient regarding missed lab and phlebotomy appointment scheduled for 9/17. Patient states that she forgot about appointments but that she is feeling fine today. Appointments cancelled and patient encouraged to call the scheduling team to get rescheduled for later this week. Dr. Earmon Phoenix office made aware.

## 2023-07-07 DIAGNOSIS — M532X6 Spinal instabilities, lumbar region: Secondary | ICD-10-CM | POA: Diagnosis not present

## 2023-07-07 DIAGNOSIS — M48062 Spinal stenosis, lumbar region with neurogenic claudication: Secondary | ICD-10-CM | POA: Diagnosis not present

## 2023-07-07 DIAGNOSIS — M4316 Spondylolisthesis, lumbar region: Secondary | ICD-10-CM | POA: Diagnosis not present

## 2023-07-07 DIAGNOSIS — M47896 Other spondylosis, lumbar region: Secondary | ICD-10-CM | POA: Diagnosis not present

## 2023-07-13 ENCOUNTER — Inpatient Hospital Stay: Payer: Medicare Other | Attending: Hematology and Oncology

## 2023-07-13 ENCOUNTER — Inpatient Hospital Stay: Payer: Medicare Other

## 2023-07-13 LAB — CBC WITH DIFFERENTIAL (CANCER CENTER ONLY)
Abs Immature Granulocytes: 0.01 10*3/uL (ref 0.00–0.07)
Basophils Absolute: 0 10*3/uL (ref 0.0–0.1)
Basophils Relative: 1 %
Eosinophils Absolute: 0.2 10*3/uL (ref 0.0–0.5)
Eosinophils Relative: 4 %
HCT: 41 % (ref 36.0–46.0)
Hemoglobin: 14.8 g/dL (ref 12.0–15.0)
Immature Granulocytes: 0 %
Lymphocytes Relative: 20 %
Lymphs Abs: 0.8 10*3/uL (ref 0.7–4.0)
MCH: 34.7 pg — ABNORMAL HIGH (ref 26.0–34.0)
MCHC: 36.1 g/dL — ABNORMAL HIGH (ref 30.0–36.0)
MCV: 96.2 fL (ref 80.0–100.0)
Monocytes Absolute: 0.3 10*3/uL (ref 0.1–1.0)
Monocytes Relative: 6 %
Neutro Abs: 2.9 10*3/uL (ref 1.7–7.7)
Neutrophils Relative %: 69 %
Platelet Count: 76 10*3/uL — ABNORMAL LOW (ref 150–400)
RBC: 4.26 MIL/uL (ref 3.87–5.11)
RDW: 12.5 % (ref 11.5–15.5)
WBC Count: 4.2 10*3/uL (ref 4.0–10.5)
nRBC: 0 % (ref 0.0–0.2)

## 2023-07-13 LAB — CMP (CANCER CENTER ONLY)
ALT: 51 U/L — ABNORMAL HIGH (ref 0–44)
AST: 39 U/L (ref 15–41)
Albumin: 4.7 g/dL (ref 3.5–5.0)
Alkaline Phosphatase: 78 U/L (ref 38–126)
Anion gap: 10 (ref 5–15)
BUN: 19 mg/dL (ref 8–23)
CO2: 30 mmol/L (ref 22–32)
Calcium: 9.8 mg/dL (ref 8.9–10.3)
Chloride: 101 mmol/L (ref 98–111)
Creatinine: 0.76 mg/dL (ref 0.44–1.00)
GFR, Estimated: >60 mL/min (ref >60)
Glucose, Bld: 200 mg/dL — ABNORMAL HIGH (ref 70–99)
Potassium: 3.9 mmol/L (ref 3.5–5.1)
Sodium: 141 mmol/L (ref 135–145)
Total Bilirubin: 1.3 mg/dL — ABNORMAL HIGH (ref 0.3–1.2)
Total Protein: 6.6 g/dL (ref 6.5–8.1)

## 2023-07-13 LAB — IRON AND IRON BINDING CAPACITY (CC-WL,HP ONLY)
Iron: 143 ug/dL (ref 28–170)
Saturation Ratios: 41 % — ABNORMAL HIGH (ref 10.4–31.8)
TIBC: 351 ug/dL (ref 250–450)
UIBC: 208 ug/dL (ref 148–442)

## 2023-07-13 NOTE — Patient Instructions (Signed)

## 2023-07-13 NOTE — Progress Notes (Signed)
[name] presents today for phlebotomy per MD orders. Phlebotomy procedure started at 1545 and ended at 1550. 520 grams removed via 16G RAC. Needle removed intact. Pressure applied to site.  Patient observed for 10 minutes after procedure without any incident. Pt declined to stay for full observation period.  Patient tolerated procedure well.

## 2023-07-14 LAB — FERRITIN: Ferritin: 126 ng/mL (ref 11–307)

## 2023-07-16 DIAGNOSIS — M48062 Spinal stenosis, lumbar region with neurogenic claudication: Secondary | ICD-10-CM | POA: Diagnosis not present

## 2023-07-19 DIAGNOSIS — M48062 Spinal stenosis, lumbar region with neurogenic claudication: Secondary | ICD-10-CM | POA: Diagnosis not present

## 2023-07-19 DIAGNOSIS — R791 Abnormal coagulation profile: Secondary | ICD-10-CM | POA: Diagnosis not present

## 2023-07-19 DIAGNOSIS — Z01818 Encounter for other preprocedural examination: Secondary | ICD-10-CM | POA: Diagnosis not present

## 2023-07-19 DIAGNOSIS — I491 Atrial premature depolarization: Secondary | ICD-10-CM | POA: Diagnosis not present

## 2023-07-20 DIAGNOSIS — M4316 Spondylolisthesis, lumbar region: Secondary | ICD-10-CM | POA: Diagnosis not present

## 2023-07-20 DIAGNOSIS — M4726 Other spondylosis with radiculopathy, lumbar region: Secondary | ICD-10-CM | POA: Diagnosis not present

## 2023-07-20 DIAGNOSIS — M48062 Spinal stenosis, lumbar region with neurogenic claudication: Secondary | ICD-10-CM | POA: Diagnosis not present

## 2023-07-20 DIAGNOSIS — Z79899 Other long term (current) drug therapy: Secondary | ICD-10-CM | POA: Diagnosis not present

## 2023-07-20 DIAGNOSIS — M532X6 Spinal instabilities, lumbar region: Secondary | ICD-10-CM | POA: Diagnosis not present

## 2023-07-20 DIAGNOSIS — M5416 Radiculopathy, lumbar region: Secondary | ICD-10-CM | POA: Diagnosis not present

## 2023-07-20 DIAGNOSIS — R2689 Other abnormalities of gait and mobility: Secondary | ICD-10-CM | POA: Diagnosis not present

## 2023-07-20 DIAGNOSIS — Z0189 Encounter for other specified special examinations: Secondary | ICD-10-CM | POA: Diagnosis not present

## 2023-07-21 DIAGNOSIS — M4316 Spondylolisthesis, lumbar region: Secondary | ICD-10-CM | POA: Diagnosis not present

## 2023-07-21 DIAGNOSIS — Z79899 Other long term (current) drug therapy: Secondary | ICD-10-CM | POA: Diagnosis not present

## 2023-07-21 DIAGNOSIS — M4726 Other spondylosis with radiculopathy, lumbar region: Secondary | ICD-10-CM | POA: Diagnosis not present

## 2023-07-21 DIAGNOSIS — R2689 Other abnormalities of gait and mobility: Secondary | ICD-10-CM | POA: Diagnosis not present

## 2023-07-21 DIAGNOSIS — M48062 Spinal stenosis, lumbar region with neurogenic claudication: Secondary | ICD-10-CM | POA: Diagnosis not present

## 2023-07-21 DIAGNOSIS — Z4789 Encounter for other orthopedic aftercare: Secondary | ICD-10-CM | POA: Diagnosis not present

## 2023-07-22 DIAGNOSIS — Z79899 Other long term (current) drug therapy: Secondary | ICD-10-CM | POA: Diagnosis not present

## 2023-07-22 DIAGNOSIS — M4726 Other spondylosis with radiculopathy, lumbar region: Secondary | ICD-10-CM | POA: Diagnosis not present

## 2023-07-22 DIAGNOSIS — R2689 Other abnormalities of gait and mobility: Secondary | ICD-10-CM | POA: Diagnosis not present

## 2023-07-22 DIAGNOSIS — M48062 Spinal stenosis, lumbar region with neurogenic claudication: Secondary | ICD-10-CM | POA: Diagnosis not present

## 2023-07-22 DIAGNOSIS — M4316 Spondylolisthesis, lumbar region: Secondary | ICD-10-CM | POA: Diagnosis not present

## 2023-07-23 DIAGNOSIS — M4726 Other spondylosis with radiculopathy, lumbar region: Secondary | ICD-10-CM | POA: Diagnosis not present

## 2023-07-23 DIAGNOSIS — Z981 Arthrodesis status: Secondary | ICD-10-CM | POA: Diagnosis not present

## 2023-07-23 DIAGNOSIS — I10 Essential (primary) hypertension: Secondary | ICD-10-CM | POA: Diagnosis not present

## 2023-07-23 DIAGNOSIS — Z79891 Long term (current) use of opiate analgesic: Secondary | ICD-10-CM | POA: Diagnosis not present

## 2023-07-23 DIAGNOSIS — J449 Chronic obstructive pulmonary disease, unspecified: Secondary | ICD-10-CM | POA: Diagnosis not present

## 2023-07-23 DIAGNOSIS — M48062 Spinal stenosis, lumbar region with neurogenic claudication: Secondary | ICD-10-CM | POA: Diagnosis not present

## 2023-07-23 DIAGNOSIS — M4316 Spondylolisthesis, lumbar region: Secondary | ICD-10-CM | POA: Diagnosis not present

## 2023-07-23 DIAGNOSIS — E785 Hyperlipidemia, unspecified: Secondary | ICD-10-CM | POA: Diagnosis not present

## 2023-07-23 DIAGNOSIS — Z604 Social exclusion and rejection: Secondary | ICD-10-CM | POA: Diagnosis not present

## 2023-07-23 DIAGNOSIS — Z4789 Encounter for other orthopedic aftercare: Secondary | ICD-10-CM | POA: Diagnosis not present

## 2023-07-23 DIAGNOSIS — F419 Anxiety disorder, unspecified: Secondary | ICD-10-CM | POA: Diagnosis not present

## 2023-07-27 DIAGNOSIS — M48062 Spinal stenosis, lumbar region with neurogenic claudication: Secondary | ICD-10-CM | POA: Diagnosis not present

## 2023-07-27 DIAGNOSIS — J449 Chronic obstructive pulmonary disease, unspecified: Secondary | ICD-10-CM | POA: Diagnosis not present

## 2023-07-27 DIAGNOSIS — I10 Essential (primary) hypertension: Secondary | ICD-10-CM | POA: Diagnosis not present

## 2023-07-27 DIAGNOSIS — Z604 Social exclusion and rejection: Secondary | ICD-10-CM | POA: Diagnosis not present

## 2023-07-27 DIAGNOSIS — M4316 Spondylolisthesis, lumbar region: Secondary | ICD-10-CM | POA: Diagnosis not present

## 2023-07-27 DIAGNOSIS — F419 Anxiety disorder, unspecified: Secondary | ICD-10-CM | POA: Diagnosis not present

## 2023-07-27 DIAGNOSIS — E785 Hyperlipidemia, unspecified: Secondary | ICD-10-CM | POA: Diagnosis not present

## 2023-07-27 DIAGNOSIS — M4726 Other spondylosis with radiculopathy, lumbar region: Secondary | ICD-10-CM | POA: Diagnosis not present

## 2023-07-27 DIAGNOSIS — Z981 Arthrodesis status: Secondary | ICD-10-CM | POA: Diagnosis not present

## 2023-07-27 DIAGNOSIS — Z4789 Encounter for other orthopedic aftercare: Secondary | ICD-10-CM | POA: Diagnosis not present

## 2023-07-27 DIAGNOSIS — Z79891 Long term (current) use of opiate analgesic: Secondary | ICD-10-CM | POA: Diagnosis not present

## 2023-07-29 DIAGNOSIS — Z604 Social exclusion and rejection: Secondary | ICD-10-CM | POA: Diagnosis not present

## 2023-07-29 DIAGNOSIS — M4316 Spondylolisthesis, lumbar region: Secondary | ICD-10-CM | POA: Diagnosis not present

## 2023-07-29 DIAGNOSIS — M4726 Other spondylosis with radiculopathy, lumbar region: Secondary | ICD-10-CM | POA: Diagnosis not present

## 2023-07-29 DIAGNOSIS — I10 Essential (primary) hypertension: Secondary | ICD-10-CM | POA: Diagnosis not present

## 2023-07-29 DIAGNOSIS — E785 Hyperlipidemia, unspecified: Secondary | ICD-10-CM | POA: Diagnosis not present

## 2023-07-29 DIAGNOSIS — Z981 Arthrodesis status: Secondary | ICD-10-CM | POA: Diagnosis not present

## 2023-07-29 DIAGNOSIS — J449 Chronic obstructive pulmonary disease, unspecified: Secondary | ICD-10-CM | POA: Diagnosis not present

## 2023-07-29 DIAGNOSIS — M48062 Spinal stenosis, lumbar region with neurogenic claudication: Secondary | ICD-10-CM | POA: Diagnosis not present

## 2023-07-29 DIAGNOSIS — Z4789 Encounter for other orthopedic aftercare: Secondary | ICD-10-CM | POA: Diagnosis not present

## 2023-07-29 DIAGNOSIS — F419 Anxiety disorder, unspecified: Secondary | ICD-10-CM | POA: Diagnosis not present

## 2023-07-29 DIAGNOSIS — Z79891 Long term (current) use of opiate analgesic: Secondary | ICD-10-CM | POA: Diagnosis not present

## 2023-08-02 DIAGNOSIS — I10 Essential (primary) hypertension: Secondary | ICD-10-CM | POA: Diagnosis not present

## 2023-08-02 DIAGNOSIS — Z4789 Encounter for other orthopedic aftercare: Secondary | ICD-10-CM | POA: Diagnosis not present

## 2023-08-02 DIAGNOSIS — Z981 Arthrodesis status: Secondary | ICD-10-CM | POA: Diagnosis not present

## 2023-08-02 DIAGNOSIS — E785 Hyperlipidemia, unspecified: Secondary | ICD-10-CM | POA: Diagnosis not present

## 2023-08-02 DIAGNOSIS — J449 Chronic obstructive pulmonary disease, unspecified: Secondary | ICD-10-CM | POA: Diagnosis not present

## 2023-08-02 DIAGNOSIS — M4726 Other spondylosis with radiculopathy, lumbar region: Secondary | ICD-10-CM | POA: Diagnosis not present

## 2023-08-02 DIAGNOSIS — Z79891 Long term (current) use of opiate analgesic: Secondary | ICD-10-CM | POA: Diagnosis not present

## 2023-08-02 DIAGNOSIS — M48062 Spinal stenosis, lumbar region with neurogenic claudication: Secondary | ICD-10-CM | POA: Diagnosis not present

## 2023-08-02 DIAGNOSIS — F419 Anxiety disorder, unspecified: Secondary | ICD-10-CM | POA: Diagnosis not present

## 2023-08-02 DIAGNOSIS — M4316 Spondylolisthesis, lumbar region: Secondary | ICD-10-CM | POA: Diagnosis not present

## 2023-08-02 DIAGNOSIS — Z604 Social exclusion and rejection: Secondary | ICD-10-CM | POA: Diagnosis not present

## 2023-08-03 ENCOUNTER — Inpatient Hospital Stay: Payer: Medicare Other

## 2023-08-05 ENCOUNTER — Inpatient Hospital Stay: Payer: Medicare Other | Admitting: Hematology and Oncology

## 2023-08-05 DIAGNOSIS — M4316 Spondylolisthesis, lumbar region: Secondary | ICD-10-CM | POA: Diagnosis not present

## 2023-08-05 DIAGNOSIS — I10 Essential (primary) hypertension: Secondary | ICD-10-CM | POA: Diagnosis not present

## 2023-08-05 DIAGNOSIS — Z4789 Encounter for other orthopedic aftercare: Secondary | ICD-10-CM | POA: Diagnosis not present

## 2023-08-05 DIAGNOSIS — Z79891 Long term (current) use of opiate analgesic: Secondary | ICD-10-CM | POA: Diagnosis not present

## 2023-08-05 DIAGNOSIS — M48062 Spinal stenosis, lumbar region with neurogenic claudication: Secondary | ICD-10-CM | POA: Diagnosis not present

## 2023-08-05 DIAGNOSIS — Z604 Social exclusion and rejection: Secondary | ICD-10-CM | POA: Diagnosis not present

## 2023-08-05 DIAGNOSIS — M4726 Other spondylosis with radiculopathy, lumbar region: Secondary | ICD-10-CM | POA: Diagnosis not present

## 2023-08-05 DIAGNOSIS — J449 Chronic obstructive pulmonary disease, unspecified: Secondary | ICD-10-CM | POA: Diagnosis not present

## 2023-08-05 DIAGNOSIS — F419 Anxiety disorder, unspecified: Secondary | ICD-10-CM | POA: Diagnosis not present

## 2023-08-05 DIAGNOSIS — Z981 Arthrodesis status: Secondary | ICD-10-CM | POA: Diagnosis not present

## 2023-08-05 DIAGNOSIS — E785 Hyperlipidemia, unspecified: Secondary | ICD-10-CM | POA: Diagnosis not present

## 2023-08-10 DIAGNOSIS — Z79891 Long term (current) use of opiate analgesic: Secondary | ICD-10-CM | POA: Diagnosis not present

## 2023-08-10 DIAGNOSIS — Z4789 Encounter for other orthopedic aftercare: Secondary | ICD-10-CM | POA: Diagnosis not present

## 2023-08-10 DIAGNOSIS — E785 Hyperlipidemia, unspecified: Secondary | ICD-10-CM | POA: Diagnosis not present

## 2023-08-10 DIAGNOSIS — J449 Chronic obstructive pulmonary disease, unspecified: Secondary | ICD-10-CM | POA: Diagnosis not present

## 2023-08-10 DIAGNOSIS — Z604 Social exclusion and rejection: Secondary | ICD-10-CM | POA: Diagnosis not present

## 2023-08-10 DIAGNOSIS — I10 Essential (primary) hypertension: Secondary | ICD-10-CM | POA: Diagnosis not present

## 2023-08-10 DIAGNOSIS — Z981 Arthrodesis status: Secondary | ICD-10-CM | POA: Diagnosis not present

## 2023-08-10 DIAGNOSIS — M48062 Spinal stenosis, lumbar region with neurogenic claudication: Secondary | ICD-10-CM | POA: Diagnosis not present

## 2023-08-10 DIAGNOSIS — M4316 Spondylolisthesis, lumbar region: Secondary | ICD-10-CM | POA: Diagnosis not present

## 2023-08-10 DIAGNOSIS — M4726 Other spondylosis with radiculopathy, lumbar region: Secondary | ICD-10-CM | POA: Diagnosis not present

## 2023-08-10 DIAGNOSIS — F419 Anxiety disorder, unspecified: Secondary | ICD-10-CM | POA: Diagnosis not present

## 2023-08-11 DIAGNOSIS — M47896 Other spondylosis, lumbar region: Secondary | ICD-10-CM | POA: Diagnosis not present

## 2023-08-13 DIAGNOSIS — M4316 Spondylolisthesis, lumbar region: Secondary | ICD-10-CM | POA: Diagnosis not present

## 2023-08-13 DIAGNOSIS — Z79891 Long term (current) use of opiate analgesic: Secondary | ICD-10-CM | POA: Diagnosis not present

## 2023-08-13 DIAGNOSIS — J449 Chronic obstructive pulmonary disease, unspecified: Secondary | ICD-10-CM | POA: Diagnosis not present

## 2023-08-13 DIAGNOSIS — I10 Essential (primary) hypertension: Secondary | ICD-10-CM | POA: Diagnosis not present

## 2023-08-13 DIAGNOSIS — Z4789 Encounter for other orthopedic aftercare: Secondary | ICD-10-CM | POA: Diagnosis not present

## 2023-08-13 DIAGNOSIS — M48062 Spinal stenosis, lumbar region with neurogenic claudication: Secondary | ICD-10-CM | POA: Diagnosis not present

## 2023-08-13 DIAGNOSIS — F419 Anxiety disorder, unspecified: Secondary | ICD-10-CM | POA: Diagnosis not present

## 2023-08-13 DIAGNOSIS — E785 Hyperlipidemia, unspecified: Secondary | ICD-10-CM | POA: Diagnosis not present

## 2023-08-13 DIAGNOSIS — M4726 Other spondylosis with radiculopathy, lumbar region: Secondary | ICD-10-CM | POA: Diagnosis not present

## 2023-08-13 DIAGNOSIS — Z981 Arthrodesis status: Secondary | ICD-10-CM | POA: Diagnosis not present

## 2023-08-13 DIAGNOSIS — Z604 Social exclusion and rejection: Secondary | ICD-10-CM | POA: Diagnosis not present

## 2023-08-17 DIAGNOSIS — J449 Chronic obstructive pulmonary disease, unspecified: Secondary | ICD-10-CM | POA: Diagnosis not present

## 2023-08-17 DIAGNOSIS — Z79891 Long term (current) use of opiate analgesic: Secondary | ICD-10-CM | POA: Diagnosis not present

## 2023-08-17 DIAGNOSIS — Z981 Arthrodesis status: Secondary | ICD-10-CM | POA: Diagnosis not present

## 2023-08-17 DIAGNOSIS — I10 Essential (primary) hypertension: Secondary | ICD-10-CM | POA: Diagnosis not present

## 2023-08-17 DIAGNOSIS — F419 Anxiety disorder, unspecified: Secondary | ICD-10-CM | POA: Diagnosis not present

## 2023-08-17 DIAGNOSIS — Z604 Social exclusion and rejection: Secondary | ICD-10-CM | POA: Diagnosis not present

## 2023-08-17 DIAGNOSIS — Z4789 Encounter for other orthopedic aftercare: Secondary | ICD-10-CM | POA: Diagnosis not present

## 2023-08-17 DIAGNOSIS — M4726 Other spondylosis with radiculopathy, lumbar region: Secondary | ICD-10-CM | POA: Diagnosis not present

## 2023-08-17 DIAGNOSIS — E785 Hyperlipidemia, unspecified: Secondary | ICD-10-CM | POA: Diagnosis not present

## 2023-08-17 DIAGNOSIS — M48062 Spinal stenosis, lumbar region with neurogenic claudication: Secondary | ICD-10-CM | POA: Diagnosis not present

## 2023-08-17 DIAGNOSIS — M4316 Spondylolisthesis, lumbar region: Secondary | ICD-10-CM | POA: Diagnosis not present

## 2023-08-21 DIAGNOSIS — M4726 Other spondylosis with radiculopathy, lumbar region: Secondary | ICD-10-CM | POA: Diagnosis not present

## 2023-08-21 DIAGNOSIS — E785 Hyperlipidemia, unspecified: Secondary | ICD-10-CM | POA: Diagnosis not present

## 2023-08-21 DIAGNOSIS — Z4789 Encounter for other orthopedic aftercare: Secondary | ICD-10-CM | POA: Diagnosis not present

## 2023-08-21 DIAGNOSIS — I10 Essential (primary) hypertension: Secondary | ICD-10-CM | POA: Diagnosis not present

## 2023-08-21 DIAGNOSIS — J449 Chronic obstructive pulmonary disease, unspecified: Secondary | ICD-10-CM | POA: Diagnosis not present

## 2023-08-21 DIAGNOSIS — F419 Anxiety disorder, unspecified: Secondary | ICD-10-CM | POA: Diagnosis not present

## 2023-08-21 DIAGNOSIS — Z79891 Long term (current) use of opiate analgesic: Secondary | ICD-10-CM | POA: Diagnosis not present

## 2023-08-21 DIAGNOSIS — M48062 Spinal stenosis, lumbar region with neurogenic claudication: Secondary | ICD-10-CM | POA: Diagnosis not present

## 2023-08-21 DIAGNOSIS — Z981 Arthrodesis status: Secondary | ICD-10-CM | POA: Diagnosis not present

## 2023-08-21 DIAGNOSIS — Z604 Social exclusion and rejection: Secondary | ICD-10-CM | POA: Diagnosis not present

## 2023-08-21 DIAGNOSIS — M4316 Spondylolisthesis, lumbar region: Secondary | ICD-10-CM | POA: Diagnosis not present

## 2023-08-22 DIAGNOSIS — Z79891 Long term (current) use of opiate analgesic: Secondary | ICD-10-CM | POA: Diagnosis not present

## 2023-08-22 DIAGNOSIS — Z981 Arthrodesis status: Secondary | ICD-10-CM | POA: Diagnosis not present

## 2023-08-22 DIAGNOSIS — M4316 Spondylolisthesis, lumbar region: Secondary | ICD-10-CM | POA: Diagnosis not present

## 2023-08-22 DIAGNOSIS — J449 Chronic obstructive pulmonary disease, unspecified: Secondary | ICD-10-CM | POA: Diagnosis not present

## 2023-08-22 DIAGNOSIS — I10 Essential (primary) hypertension: Secondary | ICD-10-CM | POA: Diagnosis not present

## 2023-08-22 DIAGNOSIS — Z4789 Encounter for other orthopedic aftercare: Secondary | ICD-10-CM | POA: Diagnosis not present

## 2023-08-22 DIAGNOSIS — E785 Hyperlipidemia, unspecified: Secondary | ICD-10-CM | POA: Diagnosis not present

## 2023-08-22 DIAGNOSIS — M48062 Spinal stenosis, lumbar region with neurogenic claudication: Secondary | ICD-10-CM | POA: Diagnosis not present

## 2023-08-22 DIAGNOSIS — M4726 Other spondylosis with radiculopathy, lumbar region: Secondary | ICD-10-CM | POA: Diagnosis not present

## 2023-08-22 DIAGNOSIS — F419 Anxiety disorder, unspecified: Secondary | ICD-10-CM | POA: Diagnosis not present

## 2023-08-22 DIAGNOSIS — Z604 Social exclusion and rejection: Secondary | ICD-10-CM | POA: Diagnosis not present

## 2023-08-24 DIAGNOSIS — M4726 Other spondylosis with radiculopathy, lumbar region: Secondary | ICD-10-CM | POA: Diagnosis not present

## 2023-08-24 DIAGNOSIS — Z79891 Long term (current) use of opiate analgesic: Secondary | ICD-10-CM | POA: Diagnosis not present

## 2023-08-24 DIAGNOSIS — M4316 Spondylolisthesis, lumbar region: Secondary | ICD-10-CM | POA: Diagnosis not present

## 2023-08-24 DIAGNOSIS — J449 Chronic obstructive pulmonary disease, unspecified: Secondary | ICD-10-CM | POA: Diagnosis not present

## 2023-08-24 DIAGNOSIS — I10 Essential (primary) hypertension: Secondary | ICD-10-CM | POA: Diagnosis not present

## 2023-08-24 DIAGNOSIS — F419 Anxiety disorder, unspecified: Secondary | ICD-10-CM | POA: Diagnosis not present

## 2023-08-24 DIAGNOSIS — Z4789 Encounter for other orthopedic aftercare: Secondary | ICD-10-CM | POA: Diagnosis not present

## 2023-08-24 DIAGNOSIS — M48062 Spinal stenosis, lumbar region with neurogenic claudication: Secondary | ICD-10-CM | POA: Diagnosis not present

## 2023-08-24 DIAGNOSIS — Z981 Arthrodesis status: Secondary | ICD-10-CM | POA: Diagnosis not present

## 2023-08-24 DIAGNOSIS — Z604 Social exclusion and rejection: Secondary | ICD-10-CM | POA: Diagnosis not present

## 2023-08-24 DIAGNOSIS — E785 Hyperlipidemia, unspecified: Secondary | ICD-10-CM | POA: Diagnosis not present

## 2023-09-01 DIAGNOSIS — Z4789 Encounter for other orthopedic aftercare: Secondary | ICD-10-CM | POA: Diagnosis not present

## 2023-09-01 DIAGNOSIS — J449 Chronic obstructive pulmonary disease, unspecified: Secondary | ICD-10-CM | POA: Diagnosis not present

## 2023-09-01 DIAGNOSIS — I10 Essential (primary) hypertension: Secondary | ICD-10-CM | POA: Diagnosis not present

## 2023-09-01 DIAGNOSIS — Z79891 Long term (current) use of opiate analgesic: Secondary | ICD-10-CM | POA: Diagnosis not present

## 2023-09-01 DIAGNOSIS — Z981 Arthrodesis status: Secondary | ICD-10-CM | POA: Diagnosis not present

## 2023-09-01 DIAGNOSIS — M4316 Spondylolisthesis, lumbar region: Secondary | ICD-10-CM | POA: Diagnosis not present

## 2023-09-01 DIAGNOSIS — E785 Hyperlipidemia, unspecified: Secondary | ICD-10-CM | POA: Diagnosis not present

## 2023-09-01 DIAGNOSIS — Z604 Social exclusion and rejection: Secondary | ICD-10-CM | POA: Diagnosis not present

## 2023-09-01 DIAGNOSIS — M48062 Spinal stenosis, lumbar region with neurogenic claudication: Secondary | ICD-10-CM | POA: Diagnosis not present

## 2023-09-01 DIAGNOSIS — M4726 Other spondylosis with radiculopathy, lumbar region: Secondary | ICD-10-CM | POA: Diagnosis not present

## 2023-09-01 DIAGNOSIS — F419 Anxiety disorder, unspecified: Secondary | ICD-10-CM | POA: Diagnosis not present

## 2023-09-02 DIAGNOSIS — L814 Other melanin hyperpigmentation: Secondary | ICD-10-CM | POA: Diagnosis not present

## 2023-09-02 DIAGNOSIS — L820 Inflamed seborrheic keratosis: Secondary | ICD-10-CM | POA: Diagnosis not present

## 2023-09-02 DIAGNOSIS — D225 Melanocytic nevi of trunk: Secondary | ICD-10-CM | POA: Diagnosis not present

## 2023-09-02 DIAGNOSIS — L218 Other seborrheic dermatitis: Secondary | ICD-10-CM | POA: Diagnosis not present

## 2023-09-02 DIAGNOSIS — L538 Other specified erythematous conditions: Secondary | ICD-10-CM | POA: Diagnosis not present

## 2023-09-02 DIAGNOSIS — Z789 Other specified health status: Secondary | ICD-10-CM | POA: Diagnosis not present

## 2023-09-02 DIAGNOSIS — L2989 Other pruritus: Secondary | ICD-10-CM | POA: Diagnosis not present

## 2023-09-02 DIAGNOSIS — L821 Other seborrheic keratosis: Secondary | ICD-10-CM | POA: Diagnosis not present

## 2023-09-06 ENCOUNTER — Inpatient Hospital Stay: Payer: Medicare Other

## 2023-09-06 ENCOUNTER — Other Ambulatory Visit: Payer: Self-pay

## 2023-09-06 ENCOUNTER — Inpatient Hospital Stay: Payer: Medicare Other | Attending: Hematology and Oncology

## 2023-09-06 DIAGNOSIS — Z79891 Long term (current) use of opiate analgesic: Secondary | ICD-10-CM | POA: Diagnosis not present

## 2023-09-06 DIAGNOSIS — Z604 Social exclusion and rejection: Secondary | ICD-10-CM | POA: Diagnosis not present

## 2023-09-06 DIAGNOSIS — M4726 Other spondylosis with radiculopathy, lumbar region: Secondary | ICD-10-CM | POA: Diagnosis not present

## 2023-09-06 DIAGNOSIS — M4316 Spondylolisthesis, lumbar region: Secondary | ICD-10-CM | POA: Diagnosis not present

## 2023-09-06 DIAGNOSIS — I10 Essential (primary) hypertension: Secondary | ICD-10-CM | POA: Diagnosis not present

## 2023-09-06 DIAGNOSIS — Z4789 Encounter for other orthopedic aftercare: Secondary | ICD-10-CM | POA: Diagnosis not present

## 2023-09-06 DIAGNOSIS — Z981 Arthrodesis status: Secondary | ICD-10-CM | POA: Diagnosis not present

## 2023-09-06 DIAGNOSIS — J449 Chronic obstructive pulmonary disease, unspecified: Secondary | ICD-10-CM | POA: Diagnosis not present

## 2023-09-06 DIAGNOSIS — F419 Anxiety disorder, unspecified: Secondary | ICD-10-CM | POA: Diagnosis not present

## 2023-09-06 DIAGNOSIS — E785 Hyperlipidemia, unspecified: Secondary | ICD-10-CM | POA: Diagnosis not present

## 2023-09-06 DIAGNOSIS — M48062 Spinal stenosis, lumbar region with neurogenic claudication: Secondary | ICD-10-CM | POA: Diagnosis not present

## 2023-09-06 LAB — CMP (CANCER CENTER ONLY)
ALT: 37 U/L (ref 0–44)
AST: 30 U/L (ref 15–41)
Albumin: 4.4 g/dL (ref 3.5–5.0)
Alkaline Phosphatase: 111 U/L (ref 38–126)
Anion gap: 7 (ref 5–15)
BUN: 16 mg/dL (ref 8–23)
CO2: 30 mmol/L (ref 22–32)
Calcium: 9.7 mg/dL (ref 8.9–10.3)
Chloride: 103 mmol/L (ref 98–111)
Creatinine: 0.63 mg/dL (ref 0.44–1.00)
GFR, Estimated: >60 mL/min (ref >60)
Glucose, Bld: 184 mg/dL — ABNORMAL HIGH (ref 70–99)
Potassium: 4.6 mmol/L (ref 3.5–5.1)
Sodium: 140 mmol/L (ref 135–145)
Total Bilirubin: 0.6 mg/dL (ref <1.2)
Total Protein: 6.6 g/dL (ref 6.5–8.1)

## 2023-09-06 LAB — CBC WITH DIFFERENTIAL (CANCER CENTER ONLY)
Abs Immature Granulocytes: 0.01 10*3/uL (ref 0.00–0.07)
Basophils Absolute: 0 10*3/uL (ref 0.0–0.1)
Basophils Relative: 1 %
Eosinophils Absolute: 0.2 10*3/uL (ref 0.0–0.5)
Eosinophils Relative: 5 %
HCT: 41.7 % (ref 36.0–46.0)
Hemoglobin: 13.9 g/dL (ref 12.0–15.0)
Immature Granulocytes: 0 %
Lymphocytes Relative: 20 %
Lymphs Abs: 0.8 10*3/uL (ref 0.7–4.0)
MCH: 31.4 pg (ref 26.0–34.0)
MCHC: 33.3 g/dL (ref 30.0–36.0)
MCV: 94.1 fL (ref 80.0–100.0)
Monocytes Absolute: 0.2 10*3/uL (ref 0.1–1.0)
Monocytes Relative: 6 %
Neutro Abs: 2.8 10*3/uL (ref 1.7–7.7)
Neutrophils Relative %: 68 %
Platelet Count: 105 10*3/uL — ABNORMAL LOW (ref 150–400)
RBC: 4.43 MIL/uL (ref 3.87–5.11)
RDW: 12.8 % (ref 11.5–15.5)
WBC Count: 4 10*3/uL (ref 4.0–10.5)
nRBC: 0 % (ref 0.0–0.2)

## 2023-09-06 LAB — IRON AND IRON BINDING CAPACITY (CC-WL,HP ONLY)
Iron: 74 ug/dL (ref 28–170)
Saturation Ratios: 20 % (ref 10.4–31.8)
TIBC: 378 ug/dL (ref 250–450)
UIBC: 304 ug/dL (ref 148–442)

## 2023-09-06 LAB — FERRITIN: Ferritin: 81 ng/mL (ref 11–307)

## 2023-09-06 NOTE — Patient Instructions (Signed)

## 2023-09-06 NOTE — Progress Notes (Signed)
[name] presents today for phlebotomy per MD orders. Phlebotomy procedure started at 915 and ended at 919. 503 grams removed via 16G PIV to RAC. IV needle removed intact. Patient observed for 25 minutes after procedure without any incident. Food and drink provided.  Patient tolerated procedure well.

## 2023-09-17 DIAGNOSIS — M4726 Other spondylosis with radiculopathy, lumbar region: Secondary | ICD-10-CM | POA: Diagnosis not present

## 2023-09-17 DIAGNOSIS — Z79891 Long term (current) use of opiate analgesic: Secondary | ICD-10-CM | POA: Diagnosis not present

## 2023-09-17 DIAGNOSIS — E785 Hyperlipidemia, unspecified: Secondary | ICD-10-CM | POA: Diagnosis not present

## 2023-09-17 DIAGNOSIS — M4316 Spondylolisthesis, lumbar region: Secondary | ICD-10-CM | POA: Diagnosis not present

## 2023-09-17 DIAGNOSIS — Z4789 Encounter for other orthopedic aftercare: Secondary | ICD-10-CM | POA: Diagnosis not present

## 2023-09-17 DIAGNOSIS — F419 Anxiety disorder, unspecified: Secondary | ICD-10-CM | POA: Diagnosis not present

## 2023-09-17 DIAGNOSIS — Z981 Arthrodesis status: Secondary | ICD-10-CM | POA: Diagnosis not present

## 2023-09-17 DIAGNOSIS — M48062 Spinal stenosis, lumbar region with neurogenic claudication: Secondary | ICD-10-CM | POA: Diagnosis not present

## 2023-09-17 DIAGNOSIS — Z604 Social exclusion and rejection: Secondary | ICD-10-CM | POA: Diagnosis not present

## 2023-09-17 DIAGNOSIS — J449 Chronic obstructive pulmonary disease, unspecified: Secondary | ICD-10-CM | POA: Diagnosis not present

## 2023-09-17 DIAGNOSIS — I10 Essential (primary) hypertension: Secondary | ICD-10-CM | POA: Diagnosis not present

## 2023-09-22 DIAGNOSIS — Z4789 Encounter for other orthopedic aftercare: Secondary | ICD-10-CM | POA: Diagnosis not present

## 2023-09-22 DIAGNOSIS — M48062 Spinal stenosis, lumbar region with neurogenic claudication: Secondary | ICD-10-CM | POA: Diagnosis not present

## 2023-09-22 DIAGNOSIS — J449 Chronic obstructive pulmonary disease, unspecified: Secondary | ICD-10-CM | POA: Diagnosis not present

## 2023-09-29 ENCOUNTER — Other Ambulatory Visit: Payer: Self-pay | Admitting: Family Medicine

## 2023-10-06 DIAGNOSIS — M48062 Spinal stenosis, lumbar region with neurogenic claudication: Secondary | ICD-10-CM | POA: Diagnosis not present

## 2023-10-12 ENCOUNTER — Other Ambulatory Visit: Payer: Self-pay | Admitting: Family Medicine

## 2023-10-20 HISTORY — PX: BACK SURGERY: SHX140

## 2023-11-04 DIAGNOSIS — M4326 Fusion of spine, lumbar region: Secondary | ICD-10-CM | POA: Diagnosis not present

## 2023-11-04 DIAGNOSIS — M48062 Spinal stenosis, lumbar region with neurogenic claudication: Secondary | ICD-10-CM | POA: Diagnosis not present

## 2023-11-04 DIAGNOSIS — Z6829 Body mass index (BMI) 29.0-29.9, adult: Secondary | ICD-10-CM | POA: Diagnosis not present

## 2023-11-05 ENCOUNTER — Inpatient Hospital Stay: Payer: Medicare Other

## 2023-11-05 ENCOUNTER — Inpatient Hospital Stay: Payer: Medicare Other | Attending: Hematology and Oncology

## 2023-11-05 DIAGNOSIS — R7989 Other specified abnormal findings of blood chemistry: Secondary | ICD-10-CM | POA: Insufficient documentation

## 2023-11-05 DIAGNOSIS — E119 Type 2 diabetes mellitus without complications: Secondary | ICD-10-CM | POA: Insufficient documentation

## 2023-11-05 LAB — CBC WITH DIFFERENTIAL (CANCER CENTER ONLY)
Abs Immature Granulocytes: 0.01 10*3/uL (ref 0.00–0.07)
Basophils Absolute: 0 10*3/uL (ref 0.0–0.1)
Basophils Relative: 1 %
Eosinophils Absolute: 0.1 10*3/uL (ref 0.0–0.5)
Eosinophils Relative: 4 %
HCT: 39.6 % (ref 36.0–46.0)
Hemoglobin: 13.9 g/dL (ref 12.0–15.0)
Immature Granulocytes: 0 %
Lymphocytes Relative: 18 %
Lymphs Abs: 0.6 10*3/uL — ABNORMAL LOW (ref 0.7–4.0)
MCH: 32.3 pg (ref 26.0–34.0)
MCHC: 35.1 g/dL (ref 30.0–36.0)
MCV: 91.9 fL (ref 80.0–100.0)
Monocytes Absolute: 0.2 10*3/uL (ref 0.1–1.0)
Monocytes Relative: 6 %
Neutro Abs: 2.6 10*3/uL (ref 1.7–7.7)
Neutrophils Relative %: 71 %
Platelet Count: 86 10*3/uL — ABNORMAL LOW (ref 150–400)
RBC: 4.31 MIL/uL (ref 3.87–5.11)
RDW: 13.6 % (ref 11.5–15.5)
WBC Count: 3.6 10*3/uL — ABNORMAL LOW (ref 4.0–10.5)
nRBC: 0 % (ref 0.0–0.2)

## 2023-11-05 LAB — IRON AND IRON BINDING CAPACITY (CC-WL,HP ONLY)
Iron: 86 ug/dL (ref 28–170)
Saturation Ratios: 24 % (ref 10.4–31.8)
TIBC: 358 ug/dL (ref 250–450)
UIBC: 272 ug/dL (ref 148–442)

## 2023-11-05 LAB — CMP (CANCER CENTER ONLY)
ALT: 35 U/L (ref 0–44)
AST: 32 U/L (ref 15–41)
Albumin: 4.4 g/dL (ref 3.5–5.0)
Alkaline Phosphatase: 124 U/L (ref 38–126)
Anion gap: 7 (ref 5–15)
BUN: 16 mg/dL (ref 8–23)
CO2: 27 mmol/L (ref 22–32)
Calcium: 9.3 mg/dL (ref 8.9–10.3)
Chloride: 105 mmol/L (ref 98–111)
Creatinine: 0.52 mg/dL (ref 0.44–1.00)
GFR, Estimated: >60 mL/min (ref >60)
Glucose, Bld: 177 mg/dL — ABNORMAL HIGH (ref 70–99)
Potassium: 4 mmol/L (ref 3.5–5.1)
Sodium: 139 mmol/L (ref 135–145)
Total Bilirubin: 0.7 mg/dL (ref 0.0–1.2)
Total Protein: 6.4 g/dL — ABNORMAL LOW (ref 6.5–8.1)

## 2023-11-05 LAB — FERRITIN: Ferritin: 51 ng/mL (ref 11–307)

## 2023-11-05 NOTE — Patient Instructions (Signed)

## 2023-11-05 NOTE — Progress Notes (Signed)
Patient here for her therapeutic phlebotomy- meets parameters of hemoglobin >10. Hemoglobin today is 13.9. VSS, 16g used in the R AC. Started at 1003 and completed at 100 with 500g removed per order. Patient counseled to stay due to it being a fast removal- patient agreed to stay for 20 minutes. Patient has been getting phlebotomies for a long time with no incidents. Patient tolerated tx well today. Snacks and fluids provided.  Post VSS- BP (!) 155/84 (BP Location: Right Arm, Patient Position: Sitting)   Pulse 88   Temp 97.9 F (36.6 C) (Oral)   Resp 16   SpO2 98%      Ambulatory to the lobby.

## 2023-11-09 ENCOUNTER — Encounter: Payer: Self-pay | Admitting: Physical Therapy

## 2023-11-09 ENCOUNTER — Ambulatory Visit: Payer: Medicare Other | Admitting: Physical Therapy

## 2023-11-09 DIAGNOSIS — M6281 Muscle weakness (generalized): Secondary | ICD-10-CM | POA: Diagnosis not present

## 2023-11-09 DIAGNOSIS — M5459 Other low back pain: Secondary | ICD-10-CM | POA: Diagnosis not present

## 2023-11-09 NOTE — Therapy (Signed)
OUTPATIENT PHYSICAL THERAPY THORACOLUMBAR EVALUATION   Patient Name: [name] MRN: [id number] DOB:February 16, 1951, 73 y.o., female Today's Date: 11/09/2023  END OF SESSION:  PT End of Session - 11/09/23 0923     Visit Number 1    Number of Visits 12    Date for PT Re-Evaluation 12/21/23    PT Start Time 0920    PT Stop Time 1000    PT Time Calculation (min) 40 min    Activity Tolerance Patient tolerated treatment well    Behavior During Therapy [facility] for tasks assessed/performed             Past Medical History:  Diagnosis Date   Allergy    Anxiety    OCC   Arthritis    COPD (chronic obstructive pulmonary disease) (HCC)    Diabetes mellitus without complication (HCC)    no meds   GERD (gastroesophageal reflux disease)    OCC   Hereditary hemochromatosis (HCC)    Hyperlipidemia    Hypertension    Vitamin D deficiency 08/2017   Past Surgical History:  Procedure Laterality Date   COLONOSCOPY     10 + years ago   FRACTURE SURGERY  1985   tibia/ Fiblia fx surgery   GANGLION CYST EXCISION  2012   post surgery infection   I & D EXTREMITY Right 07/01/2013   Procedure: IRRIGATION AND DEBRIDEMENT RIGHT LEG;  Surgeon: Nadara Mustard, MD;  Location: [facility] OR;  Service: Orthopedics;  Laterality: Right;   LAPAROSCOPIC HYSTERECTOMY     ORIF FEMUR FRACTURE Right 07/01/2013   Procedure: OPEN REDUCTION INTERNAL FIXATION (ORIF) DISTAL FEMUR FRACTURE;  Surgeon: Nadara Mustard, MD;  Location: [facility] OR;  Service: Orthopedics;  Laterality: Right;   Patient Active Problem List   Diagnosis Date Noted   Changing skin lesion 11/24/2021   Hypertension associated with diabetes (HCC) 05/28/2021   Former smoker 05/28/2021   Dyslipidemia associated with type 2 diabetes mellitus (HCC) 05/28/2021   COPD (chronic obstructive pulmonary disease) (HCC) 05/28/2021   Cataract 05/28/2021   T2DM (type 2 diabetes mellitus) (HCC) 05/28/2021   Depression, major, single episode, complete remission (HCC)  05/28/2021   Anxiety 05/28/2021   B12 deficiency 05/28/2021   Urge incontinence 05/28/2021   Hereditary hemochromatosis (HCC) 11/03/2017    PCP: Ardith Dark, MD   REFERRING PROVIDER: Patricia Nettle, MD   REFERRING DIAG: spinal stenosis, lumbar with neurogenic cladication [phone]   Rationale for Evaluation and Treatment: Rehabilitation  THERAPY DIAG:  Other low back pain  Muscle weakness (generalized)  ONSET DATE: 07/20/23 S/P L3-4 TLIF with instrumentation, L3-5 laminectomy, allograft    SUBJECTIVE:  SUBJECTIVE STATEMENT: She is doing okay since surgery, pain fluctuates depends on what she does, the pain is worse in the mornings. Pain has been much better since surgery. She does not have N/T in her legs but does describe a sensation in left heel where it feels like she is standing on needle, this has however improved some since surgery but not completely gone away. She is limited with endurance since surgery. Standing tolerance and walking tolerance are limited.  PERTINENT HISTORY:  See above PMH  PAIN:  NPRS scale: 5/10 in general  Pain location:low back Pain description: intermittent, ache, can be sharp Aggravating factors: sometimes rolling over in bed, bending, picking up stuff Relieving factors: rest, meds   PRECAUTIONS: None  RED FLAGS: None   WEIGHT BEARING RESTRICTIONS: No  FALLS:  Has patient fallen in last 6 months? No   PLOF: Independent  PATIENT GOALS: to be able to walk longer distances and stand longer without pain, bend without pain  NEXT MD VISIT: April 2025  OBJECTIVE:  Note: Objective measures were completed at Evaluation unless otherwise noted.  DIAGNOSTIC FINDINGS:  XR 07/21/23 IMPRESSION:  Posterior rod and intrapedicular screw fusion L3-L4 without   complication.   PATIENT SURVEYS:  Eval: FOTO 48% functional, goal is 54%  COGNITION: Overall cognitive status: Within functional limits for tasks assessed     SENSATION: WFL  LUMBAR ROM:   AROM eval  Flexion 90%  Extension 50%  Right lateral flexion 50%  Left lateral flexion 50%  Right rotation 50%  Left rotation 50%   (Blank rows = not tested)   LOWER EXTREMITY MMT:  5/5 leg strength grossly bilat but some functional weakness observed for sit to stands, squats, stairs.  Core strength: poor core strength overall with movement coordination impairments  LUMBAR SPECIAL TESTS:  Slump test: Positive  FUNCTIONAL TESTS:  Eval: 5 times sit to stand: 15:84 seconds, needed bilat UE support  GAIT: Distance walked: limited community distances Assistive device utilized: None Level of assistance: Complete Independence Comments: decreased tolerance for full community distances  TODAY'S TREATMENT:  Radiation protection practitioner and review with demonstration and trial set preformed, see below for details    PATIENT EDUCATION: Education details: HEP, PT plan of care Person educated: Patient Education method: Explanation, Demonstration, Verbal cues, and Handouts Education comprehension: verbalized understanding and needs further education   HOME EXERCISE PROGRAM: Access Code: [email or web address] URL: [email or web address] Date: 11/09/2023 Prepared by: Ivery Quale  Exercises - Supine Lower Trunk Rotation  - 2 x daily - 6 x weekly - 1 sets - 5 reps - 10 sec hold - Hooklying Single Knee to Chest Stretch  - 2 x daily - 6 x weekly - 1 sets - 2 reps - 20 sec hold - Slump Stretch  - 2 x daily - 6 x weekly - 1 sets - 10 reps - Standing 'L' Stretch at Counter  - 2 x daily - 6 x weekly - 1 sets - 10 reps - 5 hold  ASSESSMENT:  CLINICAL IMPRESSION: Patient referred to PT S/P L3-4 TLIF with instrumentation, L3-5 laminectomy, allograft 07/20/23. Overall doing fairly well after surgery but  some lumbar stiffness and functional weakness and core weakness with movement coordination impairments noted.  Patient will benefit from skilled PT to address below impairments, limitations and improve overall function.  OBJECTIVE IMPAIRMENTS: decreased activity tolerance, difficulty walking, decreased balance, decreased endurance, decreased mobility, decreased ROM, decreased strength, impaired flexibility, impaired UE/LE use, postural dysfunction, and pain.  ACTIVITY LIMITATIONS: bending,  lifting, carry, locomotion, cleaning, community activity,  PERSONAL FACTORS: see above PMH are also affecting patient's functional outcome.  REHAB POTENTIAL: Good  CLINICAL DECISION MAKING: Stable/uncomplicated  EVALUATION COMPLEXITY: Low    GOALS: Short term PT Goals Target date: 12/07/2023   Pt will be I and compliant with HEP. Baseline:  Goal status: New Pt will decrease pain by 25% overall Baseline: Goal status: New  Long term PT goals Target date:12/21/2023   Pt will improve lumbar AROM to [facility] to improve functional mobility Baseline: Goal status: New Pt will improve 5 times sit to stand to less than 13 seconds with UE support and be able to perform at least 5 reps without UE support regardless of time. Baseline: Goal status: New Pt will improve FOTO to at least 54% functional to show improved function Baseline: Goal status: New Pt will reduce pain to overall less than 3/10 with usual activity and cooking Baseline: Goal status: New Pt will be able to ambulate and stand at least 20 minutes without increased pain. Baseline: Goal status: New  PLAN: PT FREQUENCY: 1-2 times per week   PT DURATION: 6 weeks  PLANNED INTERVENTIONS (unless contraindicated): aquatic PT, Canalith repositioning, cryotherapy, Electrical stimulation, Iontophoresis with 4 mg/ml dexamethasome, Moist heat, Ultrasound, gait training, Therapeutic exercise, balance training, neuromuscular re-education,  patient/family education,, manual techniques, passive ROM, dry needling, taping, vasopnuematic device, vestibular, spinal manipulations, joint manipulations 97110-Therapeutic exercises, 97530- Therapeutic activity, 97112- Neuromuscular re-education, 97535- Self Care, and [id number]- Manual therapy  PLAN FOR NEXT SESSION: lumbar mobility, core strength and endurance as tolerated.    April Manson, PT,DPT 11/09/2023, 9:58 AM

## 2023-11-12 ENCOUNTER — Inpatient Hospital Stay: Payer: Medicare Other | Admitting: Hematology and Oncology

## 2023-11-16 ENCOUNTER — Inpatient Hospital Stay (HOSPITAL_BASED_OUTPATIENT_CLINIC_OR_DEPARTMENT_OTHER): Payer: Medicare Other | Admitting: Hematology and Oncology

## 2023-11-16 NOTE — Assessment & Plan Note (Signed)
Elevated liver function tests with elevated ferritin 08/2017 Ferritin : 918.9, iron saturation 43% 01/17/2018 ferritin 124, iron saturation 22% 04/05/2019: Ferritin 180, iron saturation 38% 01/23/2021: Ferritin 105, iron saturation 33%, AST 102, ALT 126, alkaline phosphatase 165, bilirubin 0.8 04/15/2021: Ferritin 43, iron saturation 13%, hemoglobin 15.3, platelets 118 AST 76, ALT 93, hemoglobin A1c 7.4 12/15/2021: Ferritin 29, iron saturation 10%, hemoglobin 14.3, platelets 92 08/14/2022: Ferritin 110, iron saturation 43%, hemoglobin 15.2, platelets 87, AST 45, ALT 54 02/01/2023: Hemoglobin 14.7, platelets 94, AST 32, ALT 39, iron saturation 34%, ferritin 85 05/03/2023: Hemoglobin 15.2, platelets 84, AST 37, ALT 40, iron saturation 43%, ferritin 131 11/05/2023: Hemoglobin 13.9, platelets 86, AST 32, ALT 35, iron saturation 24%, ferritin 51   Diabetes: Her A1c has improved to 5.2 (from 8.2)  Weight issues: She lost 20 pounds by eating healthy. Back pain: being evaluated ([facility]) : Dr.Sebastian   Current phlebotomy schedule: Every 8 weeks    Right paraspinal soft tissue mass: Patient has been referred back for discussion regarding this.  This was noted on the recent MRI of the spine at [facility].  I discussed with her that upon further review she has had this paraspinal soft tissue mass going on all the way even in 2018 and that there has not been any change in it and therefore is felt to be benign.  She is asymptomatic and hence does not require neurosurgery consultation at this time.   I will see her back in 6 months day after the phlebotomy with a telephone visit

## 2023-11-16 NOTE — Progress Notes (Signed)
HEMATOLOGY-ONCOLOGY TELEPHONE VISIT PROGRESS NOTE  I connected with our patient on 11/16/23 at  8:30 AM EST by telephone and verified that I am speaking with the correct person using two identifiers.  I discussed the limitations, risks, security and privacy concerns of performing an evaluation and management service by telephone and the availability of in person appointments.  I also discussed with the patient that there may be a patient responsible charge related to this service. The patient expressed understanding and agreed to proceed.   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  The patient is undergoing routine follow-up for blood work related to hemochromatosis. Recent blood work from January 17th shows improvement, with ferritin levels decreasing from 131 to 51, normal liver enzymes, stable hemoglobin at 13.9, and saturation dropping from 43 to 24. She is satisfied with these results and believes the current frequency of blood draws is effective.  She has a history of diabetes, which has shown dramatic improvement over the years. However, recent blood work indicated a high glucose level, and she acknowledges the need to discuss this with her primary doctor.  She underwent back surgery on October 1st, involving a laminectomy with spinal fusion. She reports doing well post-surgery, although she still experiences some pain.      REVIEW OF SYSTEMS:   Constitutional: Denies fevers, chills or abnormal weight loss All other systems were reviewed with the patient and are negative. Observations/Objective:     Assessment Plan:  Hereditary hemochromatosis (HCC) Elevated liver function tests with elevated ferritin 08/2017 Ferritin : 918.9, iron saturation 43% 01/17/2018 ferritin 124, iron saturation 22% 04/05/2019: Ferritin 180, iron saturation  38% 01/23/2021: Ferritin 105, iron saturation 33%, AST 102, ALT 126, alkaline phosphatase 165, bilirubin 0.8 04/15/2021: Ferritin 43, iron saturation 13%, hemoglobin 15.3, platelets 118 AST 76, ALT 93, hemoglobin A1c 7.4 12/15/2021: Ferritin 29, iron saturation 10%, hemoglobin 14.3, platelets 92 08/14/2022: Ferritin 110, iron saturation 43%, hemoglobin 15.2, platelets 87, AST 45, ALT 54 02/01/2023: Hemoglobin 14.7, platelets 94, AST 32, ALT 39, iron saturation 34%, ferritin 85 05/03/2023: Hemoglobin 15.2, platelets 84, AST 37, ALT 40, iron saturation 43%, ferritin 131 11/05/2023: Hemoglobin 13.9, platelets 86, AST 32, ALT 35, iron saturation 24%, ferritin 51   Diabetes: Her A1c has improved to 5.2 (from 8.2)  Weight issues: She lost 20 pounds by eating healthy. Laminectomy Oct 2024   Current phlebotomy schedule: Every 8 weeks    Right paraspinal soft tissue mass: Patient has been referred back for discussion regarding this.  This was noted on the recent MRI of the spine at [facility]  I discussed with her that upon further review she has had this paraspinal soft tissue mass going on all the way even in 2018 and that there has not been any change in it and therefore is felt to be benign.  She is asymptomatic and hence does not require neurosurgery consultation at this time.   I will see her back in 6 months day after the phlebotomy with a telephone visit  I discussed the assessment and treatment plan with the patient. The patient was provided an opportunity to ask questions and all were answered. The patient agreed with the plan and demonstrated an understanding of the instructions. The patient was advised to call back or seek an in-person evaluation if the symptoms worsen or if the condition fails to improve as anticipated.  I provided 20 minutes of non-face-to-face time during this encounter.  This includes time for charting and coordination of care   Tamsen Meek, MD

## 2023-11-18 ENCOUNTER — Ambulatory Visit: Payer: Medicare Other | Admitting: Rehabilitative and Restorative Service Providers"

## 2023-11-18 ENCOUNTER — Encounter: Payer: Self-pay | Admitting: Rehabilitative and Restorative Service Providers"

## 2023-11-18 DIAGNOSIS — M6281 Muscle weakness (generalized): Secondary | ICD-10-CM | POA: Diagnosis not present

## 2023-11-18 DIAGNOSIS — M5459 Other low back pain: Secondary | ICD-10-CM

## 2023-11-18 NOTE — Therapy (Signed)
OUTPATIENT PHYSICAL THERAPY TREATMENT   Patient Name: [name] MRN: [id number] DOB:02-Mar-1951, 73 y.o., female Today's Date: 11/18/2023  END OF SESSION:  PT End of Session - 11/18/23 1021     Visit Number 2    Number of Visits 12    Date for PT Re-Evaluation 12/21/23    Authorization Type [employer] medicare $10 copay    Progress Note Due on Visit 10    PT Start Time 1011    PT Stop Time 1050    PT Time Calculation (min) 39 min    Activity Tolerance Patient tolerated treatment well    Behavior During Therapy WFL for tasks assessed/performed              Past Medical History:  Diagnosis Date   Allergy    Anxiety    OCC   Arthritis    COPD (chronic obstructive pulmonary disease) (HCC)    Diabetes mellitus without complication (HCC)    no meds   GERD (gastroesophageal reflux disease)    OCC   Hereditary hemochromatosis (HCC)    Hyperlipidemia    Hypertension    Vitamin D deficiency 08/2017   Past Surgical History:  Procedure Laterality Date   COLONOSCOPY     10 + years ago   FRACTURE SURGERY  1985   tibia/ Fiblia fx surgery   GANGLION CYST EXCISION  2012   post surgery infection   I & D EXTREMITY Right 07/01/2013   Procedure: IRRIGATION AND DEBRIDEMENT RIGHT LEG;  Surgeon: Nadara Mustard, MD;  Location: [facility] OR;  Service: Orthopedics;  Laterality: Right;   LAPAROSCOPIC HYSTERECTOMY     ORIF FEMUR FRACTURE Right 07/01/2013   Procedure: OPEN REDUCTION INTERNAL FIXATION (ORIF) DISTAL FEMUR FRACTURE;  Surgeon: Nadara Mustard, MD;  Location: [facility] OR;  Service: Orthopedics;  Laterality: Right;   Patient Active Problem List   Diagnosis Date Noted   Changing skin lesion 11/24/2021   Hypertension associated with diabetes (HCC) 05/28/2021   Former smoker 05/28/2021   Dyslipidemia associated with type 2 diabetes mellitus (HCC) 05/28/2021   COPD (chronic obstructive pulmonary disease) (HCC) 05/28/2021   Cataract 05/28/2021   T2DM (type 2 diabetes mellitus) (HCC) 05/28/2021    Depression, major, single episode, complete remission (HCC) 05/28/2021   Anxiety 05/28/2021   B12 deficiency 05/28/2021   Urge incontinence 05/28/2021   Hereditary hemochromatosis (HCC) 11/03/2017    PCP: Ardith Dark, MD   REFERRING PROVIDER: Patricia Nettle, MD   REFERRING DIAG: spinal stenosis, lumbar with neurogenic cladication [phone]   Rationale for Evaluation and Treatment: Rehabilitation  THERAPY DIAG:  Other low back pain  Muscle weakness (generalized)  ONSET DATE: 07/20/23 S/P L3-4 TLIF with instrumentation, L3-5 laminectomy, allograft    SUBJECTIVE:  SUBJECTIVE STATEMENT: Pt indicated having complaints with bending forward and reported some pain increase with doing HEP from first day.   PERTINENT HISTORY:  See above PMH  PAIN:  NPRS scale: 1/10, up to 7/10 Pain location:low back Pain description: intermittent, ache, can be sharp Aggravating factors: bending forward Relieving factors: rest, meds   PRECAUTIONS: None  RED FLAGS: None   WEIGHT BEARING RESTRICTIONS: No  FALLS:  Has patient fallen in last 6 months? No   PLOF: Independent  PATIENT GOALS: to be able to walk longer distances and stand longer without pain, bend without pain  NEXT MD VISIT: April 2025  OBJECTIVE:  Note: Objective measures were completed at Evaluation unless otherwise noted.  DIAGNOSTIC FINDINGS:  XR 07/21/23 IMPRESSION:  Posterior rod and intrapedicular screw fusion L3-L4 without  complication.   PATIENT SURVEYS:  11/09/2023 Eval: FOTO 48% functional, goal is 54%  COGNITION: 11/09/2023 Overall cognitive status: Within functional limits for tasks assessed     SENSATION: 11/09/2023 [facility]  LUMBAR ROM:   AROM 11/09/2023  Flexion 90%  Extension 50%  Right lateral flexion 50%   Left lateral flexion 50%  Right rotation 50%  Left rotation 50%   (Blank rows = not tested)   LOWER EXTREMITY MMT:  11/09/2023 5/5 leg strength grossly bilat but some functional weakness observed for sit to stands, squats, stairs.  Core strength:  1/21/2025poor core strength overall with movement coordination impairments  LUMBAR SPECIAL TESTS:  11/09/2023 Slump test: Positive  FUNCTIONAL TESTS:  11/09/2023 Eval: 5 times sit to stand: 15:84 seconds, needed bilat UE support  GAIT: 11/09/2023 Distance walked: limited community distances Assistive device utilized: None Level of assistance: Complete Independence Comments: decreased tolerance for full community distances                    TODAY'S TREATMENT:         DATE:  11/18/2023 Therex Nustep lvl 5 10 mins UE/LE for aerobic exercise Supine lumbar trunk rotation 15 sec x 3 bilateral Supine SKC 15 sec x3 bilateral  Supine hooklying glute set 5 sec hold x 10 (unable to due to bridge) Supine hooklying hip abduction x 20 bilateral blue band Seated pball press down with UE 5 sec hold x 10 for ab contraction   Spent time in review of existing HEP to avoid pain aggravating.   TherActivity Discussion and education about lifting and positioning to limit flexion of lumbar to avoid aggravation of symptoms in short term plan.  Visual cues and demonstration given for using legs in squat movement, golfers lift.   Reviewed log rolling techniques for supine to sit to supine tranfers.     TODAY'S TREATMENT:         DATE:   11/09/2023 Eval HEP creation and review with demonstration and trial set preformed, see below for details    PATIENT EDUCATION: Education details: HEP, PT plan of care Person educated: Patient Education method: Explanation, Demonstration, Verbal cues, and Handouts Education comprehension: verbalized understanding and needs further education   HOME EXERCISE PROGRAM: Access Code: [email or web address] URL:  [email or web address] Date: 11/09/2023 Prepared by: Ivery Quale  Exercises - Supine Lower Trunk Rotation  - 2 x daily - 6 x weekly - 1 sets - 5 reps - 10 sec hold - Hooklying Single Knee to Chest Stretch  - 2 x daily - 6 x weekly - 1 sets - 2 reps - 20 sec hold - Slump Stretch  - 2 x daily - 6 x weekly -  1 sets - 10 reps - Standing 'L' Stretch at Counter  - 2 x daily - 6 x weekly - 1 sets - 10 reps - 5 hold  ASSESSMENT:  CLINICAL IMPRESSION: Pt to benefit from improved postural and biomechanics awareness in daily activity to limit pain complaints in flexion at this time.  Cues given to help manage movement in HEP to avoid pain aggravation.  Continued skilled PT services indicated at this time.   OBJECTIVE IMPAIRMENTS: decreased activity tolerance, difficulty walking, decreased balance, decreased endurance, decreased mobility, decreased ROM, decreased strength, impaired flexibility, impaired UE/LE use, postural dysfunction, and pain.  ACTIVITY LIMITATIONS: bending, lifting, carry, locomotion, cleaning, community activity,  PERSONAL FACTORS: see above PMH are also affecting patient's functional outcome.  REHAB POTENTIAL: Good  CLINICAL DECISION MAKING: Stable/uncomplicated  EVALUATION COMPLEXITY: Low    GOALS: Short term PT Goals Target date: 12/07/2023   Pt will be I and compliant with HEP. Baseline:  Goal status: on going 11/18/2023 Pt will decrease pain by 25% overall Baseline: Goal status: on going 11/18/2023  Long term PT goals Target date:12/21/2023   Pt will improve lumbar AROM to [facility] to improve functional mobility Baseline: Goal status: New Pt will improve 5 times sit to stand to less than 13 seconds with UE support and be able to perform at least 5 reps without UE support regardless of time. Baseline: Goal status: New Pt will improve FOTO to at least 54% functional to show improved function Baseline: Goal status: New Pt will reduce pain to overall  less than 3/10 with usual activity and cooking Baseline: Goal status: New Pt will be able to ambulate and stand at least 20 minutes without increased pain. Baseline: Goal status: New  PLAN: PT FREQUENCY: 1-2 times per week   PT DURATION: 6 weeks  PLANNED INTERVENTIONS (unless contraindicated): aquatic PT, Canalith repositioning, cryotherapy, Electrical stimulation, Iontophoresis with 4 mg/ml dexamethasome, Moist heat, Ultrasound, gait training, Therapeutic exercise, balance training, neuromuscular re-education, patient/family education,, manual techniques, passive ROM, dry needling, taping, vasopnuematic device, vestibular, spinal manipulations, joint manipulations 97110-Therapeutic exercises, 97530- Therapeutic activity, 97112- Neuromuscular re-education, 97535- Self Care, and 16109- Manual therapy  PLAN FOR NEXT SESSION:  Review HEP response again.  Lumbar and hip mobility/strengthening as tolerated.    Chyrel Masson, PT, DPT, OCS, ATC 11/18/23  10:46 AM

## 2023-11-23 ENCOUNTER — Ambulatory Visit: Payer: Medicare Other | Admitting: Physical Therapy

## 2023-11-23 ENCOUNTER — Encounter: Payer: Self-pay | Admitting: Physical Therapy

## 2023-11-23 DIAGNOSIS — R293 Abnormal posture: Secondary | ICD-10-CM

## 2023-11-23 DIAGNOSIS — M6281 Muscle weakness (generalized): Secondary | ICD-10-CM | POA: Diagnosis not present

## 2023-11-23 DIAGNOSIS — E7800 Pure hypercholesterolemia, unspecified: Secondary | ICD-10-CM | POA: Diagnosis not present

## 2023-11-23 DIAGNOSIS — Z0000 Encounter for general adult medical examination without abnormal findings: Secondary | ICD-10-CM | POA: Diagnosis not present

## 2023-11-23 DIAGNOSIS — M5459 Other low back pain: Secondary | ICD-10-CM | POA: Diagnosis not present

## 2023-11-23 DIAGNOSIS — I10 Essential (primary) hypertension: Secondary | ICD-10-CM | POA: Diagnosis not present

## 2023-11-23 DIAGNOSIS — J449 Chronic obstructive pulmonary disease, unspecified: Secondary | ICD-10-CM | POA: Diagnosis not present

## 2023-11-23 DIAGNOSIS — E559 Vitamin D deficiency, unspecified: Secondary | ICD-10-CM | POA: Diagnosis not present

## 2023-11-23 DIAGNOSIS — E538 Deficiency of other specified B group vitamins: Secondary | ICD-10-CM | POA: Diagnosis not present

## 2023-11-23 NOTE — Therapy (Signed)
 OUTPATIENT PHYSICAL THERAPY TREATMENT   Patient Name: [name] MRN: [id number] DOB:04/17/1951, 73 y.o., female Today's Date: 11/23/2023  END OF SESSION:  PT End of Session - 11/23/23 1037     Visit Number 3    Number of Visits 12    Date for PT Re-Evaluation 12/21/23    Authorization Type [employer] medicare $10 copay    Progress Note Due on Visit 10    PT Start Time 1016    PT Stop Time 1055    PT Time Calculation (min) 39 min    Activity Tolerance Patient tolerated treatment well    Behavior During Therapy WFL for tasks assessed/performed              Past Medical History:  Diagnosis Date   Allergy    Anxiety    OCC   Arthritis    COPD (chronic obstructive pulmonary disease) (HCC)    Diabetes mellitus without complication (HCC)    no meds   GERD (gastroesophageal reflux disease)    OCC   Hereditary hemochromatosis (HCC)    Hyperlipidemia    Hypertension    Vitamin D deficiency 08/2017   Past Surgical History:  Procedure Laterality Date   COLONOSCOPY     10 + years ago   FRACTURE SURGERY  1985   tibia/ Fiblia fx surgery   GANGLION CYST EXCISION  2012   post surgery infection   I & D EXTREMITY Right 07/01/2013   Procedure: IRRIGATION AND DEBRIDEMENT RIGHT LEG;  Surgeon: Jerona LULLA Sage, MD;  Location: [facility] OR;  Service: Orthopedics;  Laterality: Right;   LAPAROSCOPIC HYSTERECTOMY     ORIF FEMUR FRACTURE Right 07/01/2013   Procedure: OPEN REDUCTION INTERNAL FIXATION (ORIF) DISTAL FEMUR FRACTURE;  Surgeon: Jerona LULLA Sage, MD;  Location: [facility] OR;  Service: Orthopedics;  Laterality: Right;   Patient Active Problem List   Diagnosis Date Noted   Changing skin lesion 11/24/2021   Hypertension associated with diabetes (HCC) 05/28/2021   Former smoker 05/28/2021   Dyslipidemia associated with type 2 diabetes mellitus (HCC) 05/28/2021   COPD (chronic obstructive pulmonary disease) (HCC) 05/28/2021   Cataract 05/28/2021   T2DM (type 2 diabetes mellitus) (HCC) 05/28/2021    Depression, major, single episode, complete remission (HCC) 05/28/2021   Anxiety 05/28/2021   B12 deficiency 05/28/2021   Urge incontinence 05/28/2021   Hereditary hemochromatosis (HCC) 11/03/2017    PCP: Kennyth Worth HERO, MD   REFERRING PROVIDER: Kennyth Worth HERO, MD   REFERRING DIAG: spinal stenosis, lumbar with neurogenic cladication [phone]   Rationale for Evaluation and Treatment: Rehabilitation  THERAPY DIAG:  Other low back pain  Muscle weakness (generalized)  Abnormal posture  ONSET DATE: 07/20/23 S/P L3-4 TLIF with instrumentation, L3-5 laminectomy, allograft    SUBJECTIVE:  SUBJECTIVE STATEMENT: Relays not having back pain today but aware of her back still post op  PERTINENT HISTORY:  See above PMH  PAIN:  NPRS scale: 1/10, up to 7/10 Pain location:low back Pain description: intermittent, ache, can be sharp Aggravating factors: bending forward Relieving factors: rest, meds   PRECAUTIONS: None  RED FLAGS: None   WEIGHT BEARING RESTRICTIONS: No  FALLS:  Has patient fallen in last 6 months? No   PLOF: Independent  PATIENT GOALS: to be able to walk longer distances and stand longer without pain, bend without pain  NEXT MD VISIT: April 2025  OBJECTIVE:  Note: Objective measures were completed at Evaluation unless otherwise noted.  DIAGNOSTIC FINDINGS:  XR 07/21/23 IMPRESSION:  Posterior rod and intrapedicular screw fusion L3-L4 without  complication.   PATIENT SURVEYS:  11/09/2023 Eval: FOTO 48% functional, goal is 54%  COGNITION: 11/09/2023 Overall cognitive status: Within functional limits for tasks assessed     SENSATION: 11/09/2023 [facility]  LUMBAR ROM:   AROM 11/09/2023  Flexion 90%  Extension 50%  Right lateral flexion 50%  Left lateral flexion 50%   Right rotation 50%  Left rotation 50%   (Blank rows = not tested)   LOWER EXTREMITY MMT:  11/09/2023 5/5 leg strength grossly bilat but some functional weakness observed for sit to stands, squats, stairs.  Core strength:  1/21/2025poor core strength overall with movement coordination impairments  LUMBAR SPECIAL TESTS:  11/09/2023 Slump test: Positive  FUNCTIONAL TESTS:  11/09/2023 Eval: 5 times sit to stand: 15:84 seconds, needed bilat UE support  GAIT: 11/09/2023 Distance walked: limited community distances Assistive device utilized: None Level of assistance: Complete Independence Comments: decreased tolerance for full community distances                    TODAY'S TREATMENT:        DATE:  11/23/2023 Therex Nustep lvl 5 10 mins UE/LE for aerobic exercise Standing L stretch 5 sec X 10 Supine lumbar trunk rotation 15 sec x 3 bilateral Supine SKC 15 sec x3 bilateral  Supine hooklying glute set 5 sec hold x 10 (unable to due to bridge) Supine hooklying hip abduction x 20 bilateral blue band Seated pball press down with UE 5 sec hold x 10 for ab contraction Seated pball isometirc hip flexion 5 sec X 10 bilat Standing alternating unilateral rows with trunk rotation green X 15 each side     DATE:  11/18/2023 Therex Nustep lvl 5 10 mins UE/LE for aerobic exercise Supine lumbar trunk rotation 15 sec x 3 bilateral Supine SKC 15 sec x3 bilateral  Supine hooklying glute set 5 sec hold x 10 (unable to due to bridge) Supine hooklying hip abduction x 20 bilateral blue band Seated pball press down with UE 5 sec hold x 10 for ab contraction   Spent time in review of existing HEP to avoid pain aggravating.   TherActivity Discussion and education about lifting and positioning to limit flexion of lumbar to avoid aggravation of symptoms in short term plan.  Visual cues and demonstration given for using legs in squat movement, golfers lift.   Reviewed log rolling techniques for supine to  sit to supine tranfers.     TODAY'S TREATMENT:         DATE:   11/09/2023 Eval HEP creation and review with demonstration and trial set preformed, see below for details    PATIENT EDUCATION: Education details: HEP, PT plan of care Person educated: Patient Education method: Explanation, Demonstration,  Verbal cues, and Handouts Education comprehension: verbalized understanding and needs further education   HOME EXERCISE PROGRAM: Access Code: [email or web address] URL: [email or web address] Date: 11/09/2023 Prepared by: Redell Moose  Exercises - Supine Lower Trunk Rotation  - 2 x daily - 6 x weekly - 1 sets - 5 reps - 10 sec hold - Hooklying Single Knee to Chest Stretch  - 2 x daily - 6 x weekly - 1 sets - 2 reps - 20 sec hold - Slump Stretch  - 2 x daily - 6 x weekly - 1 sets - 10 reps - Standing 'L' Stretch at Counter  - 2 x daily - 6 x weekly - 1 sets - 10 reps - 5 hold  ASSESSMENT:  CLINICAL IMPRESSION: She did well with program today except glute sets continue to bother her so we may substitute this for standing hip extensions in the future. Other than that she is expressing improved activity tolerance and pain overall and PT recommending to continue to progress as toleratd.  OBJECTIVE IMPAIRMENTS: decreased activity tolerance, difficulty walking, decreased balance, decreased endurance, decreased mobility, decreased ROM, decreased strength, impaired flexibility, impaired UE/LE use, postural dysfunction, and pain.  ACTIVITY LIMITATIONS: bending, lifting, carry, locomotion, cleaning, community activity,  PERSONAL FACTORS: see above PMH are also affecting patient's functional outcome.  REHAB POTENTIAL: Good  CLINICAL DECISION MAKING: Stable/uncomplicated  EVALUATION COMPLEXITY: Low    GOALS: Short term PT Goals Target date: 12/07/2023   Pt will be I and compliant with HEP. Baseline:  Goal status: on going 11/18/2023 Pt will decrease pain by 25%  overall Baseline: Goal status: on going 11/18/2023  Long term PT goals Target date:12/21/2023   Pt will improve lumbar AROM to [employer] to improve functional mobility Baseline: Goal status: New Pt will improve 5 times sit to stand to less than 13 seconds with UE support and be able to perform at least 5 reps without UE support regardless of time. Baseline: Goal status: New Pt will improve FOTO to at least 54% functional to show improved function Baseline: Goal status: New Pt will reduce pain to overall less than 3/10 with usual activity and cooking Baseline: Goal status: New Pt will be able to ambulate and stand at least 20 minutes without increased pain. Baseline: Goal status: New  PLAN: PT FREQUENCY: 1-2 times per week   PT DURATION: 6 weeks  PLANNED INTERVENTIONS (unless contraindicated): aquatic PT, Canalith repositioning, cryotherapy, Electrical stimulation, Iontophoresis with 4 mg/ml dexamethasome, Moist heat, Ultrasound, gait training, Therapeutic exercise, balance training, neuromuscular re-education, patient/family education,, manual techniques, passive ROM, dry needling, taping, vasopnuematic device, vestibular, spinal manipulations, joint manipulations 97110-Therapeutic exercises, 97530- Therapeutic activity, 97112- Neuromuscular re-education, 97535- Self Care, and 02859- Manual therapy  PLAN FOR NEXT SESSION: ,may need to substitute glute sets, Lumbar and hip mobility/strengthening as tolerated.   Redell Moose, PT, DPT 11/23/23 11:09 AM

## 2023-11-25 ENCOUNTER — Encounter: Payer: Self-pay | Admitting: Physical Therapy

## 2023-11-25 ENCOUNTER — Ambulatory Visit: Payer: Medicare Other | Admitting: Physical Therapy

## 2023-11-25 DIAGNOSIS — M6281 Muscle weakness (generalized): Secondary | ICD-10-CM

## 2023-11-25 DIAGNOSIS — M5459 Other low back pain: Secondary | ICD-10-CM

## 2023-11-25 DIAGNOSIS — R293 Abnormal posture: Secondary | ICD-10-CM | POA: Diagnosis not present

## 2023-11-25 NOTE — Therapy (Signed)
 OUTPATIENT PHYSICAL THERAPY TREATMENT   Patient Name: [name] MRN: [id number] DOB:03-29-1951, 73 y.o., female Today's Date: 11/25/2023  END OF SESSION:  PT End of Session - 11/25/23 0935     Visit Number 4    Number of Visits 12    Date for PT Re-Evaluation 12/21/23    Authorization Type [employer] medicare $10 copay    Progress Note Due on Visit 10    PT Start Time 0930    PT Stop Time 1008    PT Time Calculation (min) 38 min    Activity Tolerance Patient tolerated treatment well    Behavior During Therapy WFL for tasks assessed/performed              Past Medical History:  Diagnosis Date   Allergy    Anxiety    OCC   Arthritis    COPD (chronic obstructive pulmonary disease) (HCC)    Diabetes mellitus without complication (HCC)    no meds   GERD (gastroesophageal reflux disease)    OCC   Hereditary hemochromatosis (HCC)    Hyperlipidemia    Hypertension    Vitamin D deficiency 08/2017   Past Surgical History:  Procedure Laterality Date   COLONOSCOPY     10 + years ago   FRACTURE SURGERY  1985   tibia/ Fiblia fx surgery   GANGLION CYST EXCISION  2012   post surgery infection   I & D EXTREMITY Right 07/01/2013   Procedure: IRRIGATION AND DEBRIDEMENT RIGHT LEG;  Surgeon: Jerona LULLA Sage, MD;  Location: [facility] OR;  Service: Orthopedics;  Laterality: Right;   LAPAROSCOPIC HYSTERECTOMY     ORIF FEMUR FRACTURE Right 07/01/2013   Procedure: OPEN REDUCTION INTERNAL FIXATION (ORIF) DISTAL FEMUR FRACTURE;  Surgeon: Jerona LULLA Sage, MD;  Location: [facility];  Service: Orthopedics;  Laterality: Right;   Patient Active Problem List   Diagnosis Date Noted   Changing skin lesion 11/24/2021   Hypertension associated with diabetes (HCC) 05/28/2021   Former smoker 05/28/2021   Dyslipidemia associated with type 2 diabetes mellitus (HCC) 05/28/2021   COPD (chronic obstructive pulmonary disease) (HCC) 05/28/2021   Cataract 05/28/2021   T2DM (type 2 diabetes mellitus) (HCC) 05/28/2021    Depression, major, single episode, complete remission (HCC) 05/28/2021   Anxiety 05/28/2021   B12 deficiency 05/28/2021   Urge incontinence 05/28/2021   Hereditary hemochromatosis (HCC) 11/03/2017    PCP: Kennyth Worth HERO, MD   REFERRING PROVIDER: Gust Royden ORN, MD   REFERRING DIAG: spinal stenosis, lumbar with neurogenic cladication [phone]   Rationale for Evaluation and Treatment: Rehabilitation  THERAPY DIAG:  Other low back pain  Muscle weakness (generalized)  Abnormal posture  ONSET DATE: 07/20/23 S/P L3-4 TLIF with instrumentation, L3-5 laminectomy, allograft    SUBJECTIVE:  SUBJECTIVE STATEMENT: Relays back pain has calmed down some  PERTINENT HISTORY:  See above PMH  PAIN:  NPRS scale: some in right leg that is always there, no pain in back today upon arrival Pain location:low back Pain description: intermittent, ache, can be sharp Aggravating factors: bending forward Relieving factors: rest, meds   PRECAUTIONS: None  RED FLAGS: None   WEIGHT BEARING RESTRICTIONS: No  FALLS:  Has patient fallen in last 6 months? No   PLOF: Independent  PATIENT GOALS: to be able to walk longer distances and stand longer without pain, bend without pain  NEXT MD VISIT: April 2025  OBJECTIVE:  Note: Objective measures were completed at Evaluation unless otherwise noted.  DIAGNOSTIC FINDINGS:  XR 07/21/23 IMPRESSION:  Posterior rod and intrapedicular screw fusion L3-L4 without  complication.   PATIENT SURVEYS:  11/09/2023 Eval: FOTO 48% functional, goal is 54%  COGNITION: 11/09/2023 Overall cognitive status: Within functional limits for tasks assessed     SENSATION: 11/09/2023 [facility]  LUMBAR ROM:   AROM 11/09/2023  Flexion 90%  Extension 50%  Right lateral flexion 50%   Left lateral flexion 50%  Right rotation 50%  Left rotation 50%   (Blank rows = not tested)   LOWER EXTREMITY MMT:  11/09/2023 5/5 leg strength grossly bilat but some functional weakness observed for sit to stands, squats, stairs.  Core strength:  1/21/2025poor core strength overall with movement coordination impairments  LUMBAR SPECIAL TESTS:  11/09/2023 Slump test: Positive  FUNCTIONAL TESTS:  11/09/2023 Eval: 5 times sit to stand: 15:84 seconds, needed bilat UE support  GAIT: 11/09/2023 Distance walked: limited community distances Assistive device utilized: None Level of assistance: Complete Independence Comments: decreased tolerance for full community distances                    TODAY'S TREATMENT:        DATE:  11/23/2023 Therex Nustep lvl 6 10 mins UE/LE for aerobic exercise Seated pball press down with UE 5 sec hold x 10 for ab contraction Seated pball isometirc hip flexion 5 sec X 10 bilat   Therapeutic activity:(to aid transfers, standing tolerance, squats, stairs, ambulation, lifting) Leg press DL 24# [id number] Single leg press 50# X 15 each side Standing alternating unilateral rows with trunk rotation green X 15 each side Standing hip abductions red 2X10 Standing hip extensions red 2X10 Suitcase carry 5# 100 feet each arm Standing L stretch X 10 holding flexion 5 sec then holding extension 5 sec Neuromuscular re-ed   DATE:  11/23/2023 Therex Nustep lvl 5 10 mins UE/LE for aerobic exercise Standing L stretch 5 sec X 10 Supine lumbar trunk rotation 15 sec x 3 bilateral Supine SKC 15 sec x3 bilateral  Supine hooklying glute set 5 sec hold x 10 (unable to due to bridge) Supine hooklying hip abduction x 20 bilateral blue band Seated pball press down with UE 5 sec hold x 10 for ab contraction Seated pball isometirc hip flexion 5 sec X 10 bilat Standing alternating unilateral rows with trunk rotation green X 15 each side     DATE:  11/18/2023 Therex Nustep lvl 5  10 mins UE/LE for aerobic exercise Supine lumbar trunk rotation 15 sec x 3 bilateral Supine SKC 15 sec x3 bilateral  Supine hooklying glute set 5 sec hold x 10 (unable to due to bridge) Supine hooklying hip abduction x 20 bilateral blue band Seated pball press down with UE 5 sec hold x 10 for ab contraction   Spent  time in review of existing HEP to avoid pain aggravating.   TherActivity Discussion and education about lifting and positioning to limit flexion of lumbar to avoid aggravation of symptoms in short term plan.  Visual cues and demonstration given for using legs in squat movement, golfers lift.   Reviewed log rolling techniques for supine to sit to supine tranfers.     TODAY'S TREATMENT:         DATE:   11/09/2023 Eval HEP creation and review with demonstration and trial set preformed, see below for details    PATIENT EDUCATION: Education details: HEP, PT plan of care Person educated: Patient Education method: Explanation, Demonstration, Verbal cues, and Handouts Education comprehension: verbalized understanding and needs further education   HOME EXERCISE PROGRAM: Access Code: [email or web address] URL: [email or web address] Date: 11/09/2023 Prepared by: Redell Moose  Exercises - Supine Lower Trunk Rotation  - 2 x daily - 6 x weekly - 1 sets - 5 reps - 10 sec hold - Hooklying Single Knee to Chest Stretch  - 2 x daily - 6 x weekly - 1 sets - 2 reps - 20 sec hold - Slump Stretch  - 2 x daily - 6 x weekly - 1 sets - 10 reps - Standing 'L' Stretch at Counter  - 2 x daily - 6 x weekly - 1 sets - 10 reps - 5 hold  ASSESSMENT:  CLINICAL IMPRESSION: Her back pain has calmed down some to I did progress her standing activity and functional strength for standing tolerance and ADL's. We will monitor for any soreness. I did modify the bridges and glute sets as they seem to bother her for standing hip abd kicks and extension kicks with resistance to activate glutes in mannor that  do not cause the pain as with the supine ones. PT recommending to continue to progress as toleratd.  OBJECTIVE IMPAIRMENTS: decreased activity tolerance, difficulty walking, decreased balance, decreased endurance, decreased mobility, decreased ROM, decreased strength, impaired flexibility, impaired UE/LE use, postural dysfunction, and pain.  ACTIVITY LIMITATIONS: bending, lifting, carry, locomotion, cleaning, community activity,  PERSONAL FACTORS: see above PMH are also affecting patient's functional outcome.  REHAB POTENTIAL: Good  CLINICAL DECISION MAKING: Stable/uncomplicated  EVALUATION COMPLEXITY: Low    GOALS: Short term PT Goals Target date: 12/07/2023   Pt will be I and compliant with HEP. Baseline:  Goal status: on going 11/18/2023 Pt will decrease pain by 25% overall Baseline: Goal status: on going 11/18/2023  Long term PT goals Target date:12/21/2023   Pt will improve lumbar AROM to [facility] to improve functional mobility Baseline: Goal status: New Pt will improve 5 times sit to stand to less than 13 seconds with UE support and be able to perform at least 5 reps without UE support regardless of time. Baseline: Goal status: New Pt will improve FOTO to at least 54% functional to show improved function Baseline: Goal status: New Pt will reduce pain to overall less than 3/10 with usual activity and cooking Baseline: Goal status: New Pt will be able to ambulate and stand at least 20 minutes without increased pain. Baseline: Goal status: New  PLAN: PT FREQUENCY: 1-2 times per week   PT DURATION: 6 weeks  PLANNED INTERVENTIONS (unless contraindicated): aquatic PT, Canalith repositioning, cryotherapy, Electrical stimulation, Iontophoresis with 4 mg/ml dexamethasome, Moist heat, Ultrasound, gait training, Therapeutic exercise, balance training, neuromuscular re-education, patient/family education,, manual techniques, passive ROM, dry needling, taping, vasopnuematic device,  vestibular, spinal manipulations, joint manipulations 97110-Therapeutic exercises, 97530- Therapeutic activity, 97112- Neuromuscular re-education,  02464- Self Care, and 02859- Manual therapy  PLAN FOR NEXT SESSION: monitor soreness and Lumbar and hip mobility/strengthening as tolerated.   Redell Moose, PT, DPT 11/25/23 9:40 AM

## 2023-11-29 ENCOUNTER — Ambulatory Visit: Payer: Medicare Other | Admitting: Physical Therapy

## 2023-11-29 ENCOUNTER — Encounter: Payer: Self-pay | Admitting: Physical Therapy

## 2023-11-29 DIAGNOSIS — M6281 Muscle weakness (generalized): Secondary | ICD-10-CM | POA: Diagnosis not present

## 2023-11-29 DIAGNOSIS — M5459 Other low back pain: Secondary | ICD-10-CM

## 2023-11-29 DIAGNOSIS — R293 Abnormal posture: Secondary | ICD-10-CM | POA: Diagnosis not present

## 2023-11-29 NOTE — Therapy (Signed)
 OUTPATIENT PHYSICAL THERAPY TREATMENT   Patient Name: [name] MRN: [id number] DOB:1951/05/30, 73 y.o., female Today's Date: 11/29/2023  END OF SESSION:  PT End of Session - 11/29/23 0935     Visit Number 5    Number of Visits 12    Date for PT Re-Evaluation 12/21/23    Authorization Type [employer] medicare $10 copay    Progress Note Due on Visit 10    PT Start Time [phone]    PT Stop Time 1009    PT Time Calculation (min) 38 min    Activity Tolerance Patient tolerated treatment well    Behavior During Therapy [facility] for tasks assessed/performed              Past Medical History:  Diagnosis Date   Allergy    Anxiety    OCC   Arthritis    COPD (chronic obstructive pulmonary disease) (HCC)    Diabetes mellitus without complication (HCC)    no meds   GERD (gastroesophageal reflux disease)    OCC   Hereditary hemochromatosis (HCC)    Hyperlipidemia    Hypertension    Vitamin D deficiency 08/2017   Past Surgical History:  Procedure Laterality Date   COLONOSCOPY     10 + years ago   FRACTURE SURGERY  1985   tibia/ Fiblia fx surgery   GANGLION CYST EXCISION  2012   post surgery infection   I & D EXTREMITY Right 07/01/2013   Procedure: IRRIGATION AND DEBRIDEMENT RIGHT LEG;  Surgeon: Timothy Ford, MD;  Location: [facility] OR;  Service: Orthopedics;  Laterality: Right;   LAPAROSCOPIC HYSTERECTOMY     ORIF FEMUR FRACTURE Right 07/01/2013   Procedure: OPEN REDUCTION INTERNAL FIXATION (ORIF) DISTAL FEMUR FRACTURE;  Surgeon: Timothy Ford, MD;  Location: [facility] OR;  Service: Orthopedics;  Laterality: Right;   Patient Active Problem List   Diagnosis Date Noted   Changing skin lesion 11/24/2021   Hypertension associated with diabetes (HCC) 05/28/2021   Former smoker 05/28/2021   Dyslipidemia associated with type 2 diabetes mellitus (HCC) 05/28/2021   COPD (chronic obstructive pulmonary disease) (HCC) 05/28/2021   Cataract 05/28/2021   T2DM (type 2 diabetes mellitus) (HCC) 05/28/2021    Depression, major, single episode, complete remission (HCC) 05/28/2021   Anxiety 05/28/2021   B12 deficiency 05/28/2021   Urge incontinence 05/28/2021   Hereditary hemochromatosis (HCC) 11/03/2017    PCP: Rodney Clamp, MD   REFERRING PROVIDER: Cloteal Daniels, MD   REFERRING DIAG: spinal stenosis, lumbar with neurogenic cladication [phone]   Rationale for Evaluation and Treatment: Rehabilitation  THERAPY DIAG:  Other low back pain  Muscle weakness (generalized)  Abnormal posture  ONSET DATE: 07/20/23 S/P L3-4 TLIF with instrumentation, L3-5 laminectomy, allograft    SUBJECTIVE:  SUBJECTIVE STATEMENT: Relays the pain is not bad today, she can stand longer than she used to and she can get in an out of bed normally now.  PERTINENT HISTORY:  See above PMH  PAIN:  NPRS scale: some in right leg that is always there, no pain in back today upon arrival Pain location:low back Pain description: intermittent, ache, can be sharp Aggravating factors: bending forward Relieving factors: rest, meds   PRECAUTIONS: None  RED FLAGS: None   WEIGHT BEARING RESTRICTIONS: No  FALLS:  Has patient fallen in last 6 months? No   PLOF: Independent  PATIENT GOALS: to be able to walk longer distances and stand longer without pain, bend without pain  NEXT MD VISIT: April 2025  OBJECTIVE:  Note: Objective measures were completed at Evaluation unless otherwise noted.  DIAGNOSTIC FINDINGS:  XR 07/21/23 IMPRESSION:  Posterior rod and intrapedicular screw fusion L3-L4 without  complication.   PATIENT SURVEYS:  11/09/2023 Eval: FOTO 48% functional, goal is 54%  COGNITION: 11/09/2023 Overall cognitive status: Within functional limits for tasks assessed     SENSATION: 11/09/2023 [facility]  LUMBAR ROM:    AROM 11/09/2023  Flexion 90%  Extension 50%  Right lateral flexion 50%  Left lateral flexion 50%  Right rotation 50%  Left rotation 50%   (Blank rows = not tested)   LOWER EXTREMITY MMT:  11/09/2023 5/5 leg strength grossly bilat but some functional weakness observed for sit to stands, squats, stairs.  Core strength:  1/21/2025poor core strength overall with movement coordination impairments  LUMBAR SPECIAL TESTS:  11/09/2023 Slump test: Positive  FUNCTIONAL TESTS:  11/09/2023 Eval: 5 times sit to stand: 15:84 seconds, needed bilat UE support  GAIT: 11/09/2023 Distance walked: limited community distances Assistive device utilized: None Level of assistance: Complete Independence Comments: decreased tolerance for full community distances                    TODAY'S TREATMENT:        DATE:  11/29/2023 Therex Nustep lvl 6 10 mins UE/LE for aerobic exercise Seated pball press down with UE 5 sec hold x 10 for ab contraction Seated pball isometirc hip flexion 5 sec X 10 bilat Seated pball L stretch 5 sec X 10 Seated yellow ball lumbar extension isometric 5 sec X 10   Therapeutic activity:(to aid transfers, standing tolerance, squats, stairs, ambulation, lifting) Leg press DL 46# 9G29, then SL 52# 8U13 each side Standing bilat rows green 2X15 Standing shoulder extensions green 2X15 Standing hip abductions red 2X10 Standing hip extensions red 2X10 Suitcase carry 10# 100 feet each arm Standing L stretch X 10 holding flexion 5 sec then holding extension 5 sec  DATE:  11/23/2023 Therex Nustep lvl 6 10 mins UE/LE for aerobic exercise Seated pball press down with UE 5 sec hold x 10 for ab contraction Seated pball isometirc hip flexion 5 sec X 10 bilat   Therapeutic activity:(to aid transfers, standing tolerance, squats, stairs, ambulation, lifting) Leg press DL 24# 4W10 Single leg press 50# X 15 each side Standing alternating unilateral rows with trunk rotation green X 15  each side Standing hip abductions red 2X10 Standing hip extensions red 2X10 Suitcase carry 5# 100 feet each arm Standing L stretch X 10 holding flexion 5 sec then holding extension 5 sec    DATE:  11/23/2023 Therex Nustep lvl 5 10 mins UE/LE for aerobic exercise Standing L stretch 5 sec X 10 Supine lumbar trunk rotation 15 sec x 3 bilateral Supine  SKC 15 sec x3 bilateral  Supine hooklying glute set 5 sec hold x 10 (unable to due to bridge) Supine hooklying hip abduction x 20 bilateral blue band Seated pball press down with UE 5 sec hold x 10 for ab contraction Seated pball isometirc hip flexion 5 sec X 10 bilat Standing alternating unilateral rows with trunk rotation green X 15 each side     DATE:  11/18/2023 Therex Nustep lvl 5 10 mins UE/LE for aerobic exercise Supine lumbar trunk rotation 15 sec x 3 bilateral Supine SKC 15 sec x3 bilateral  Supine hooklying glute set 5 sec hold x 10 (unable to due to bridge) Supine hooklying hip abduction x 20 bilateral blue band Seated pball press down with UE 5 sec hold x 10 for ab contraction   Spent time in review of existing HEP to avoid pain aggravating.   TherActivity Discussion and education about lifting and positioning to limit flexion of lumbar to avoid aggravation of symptoms in short term plan.  Visual cues and demonstration given for using legs in squat movement, golfers lift.   Reviewed log rolling techniques for supine to sit to supine tranfers.     TODAY'S TREATMENT:         DATE:   11/09/2023 Eval HEP creation and review with demonstration and trial set preformed, see below for details    PATIENT EDUCATION: Education details: HEP, PT plan of care Person educated: Patient Education method: Explanation, Demonstration, Verbal cues, and Handouts Education comprehension: verbalized understanding and needs further education   HOME EXERCISE PROGRAM: Access Code: [email or web address] URL: [email or web address] Date:  11/09/2023 Prepared by: Jamee Mazzoni  Exercises - Supine Lower Trunk Rotation  - 2 x daily - 6 x weekly - 1 sets - 5 reps - 10 sec hold - Hooklying Single Knee to Chest Stretch  - 2 x daily - 6 x weekly - 1 sets - 2 reps - 20 sec hold - Slump Stretch  - 2 x daily - 6 x weekly - 1 sets - 10 reps - Standing 'L' Stretch at Counter  - 2 x daily - 6 x weekly - 1 sets - 10 reps - 5 hold  ASSESSMENT:  CLINICAL IMPRESSION: She is noticing some improvements in pain and function activities. I did progress he strength program gradually today and she had good tolerance to this.   OBJECTIVE IMPAIRMENTS: decreased activity tolerance, difficulty walking, decreased balance, decreased endurance, decreased mobility, decreased ROM, decreased strength, impaired flexibility, impaired UE/LE use, postural dysfunction, and pain.  ACTIVITY LIMITATIONS: bending, lifting, carry, locomotion, cleaning, community activity,  PERSONAL FACTORS: see above PMH are also affecting patient's functional outcome.  REHAB POTENTIAL: Good  CLINICAL DECISION MAKING: Stable/uncomplicated  EVALUATION COMPLEXITY: Low    GOALS: Short term PT Goals Target date: 12/07/2023   Pt will be I and compliant with HEP. Baseline:  Goal status: on going 11/18/2023 Pt will decrease pain by 25% overall Baseline: Goal status: on going 11/18/2023  Long term PT goals Target date:12/21/2023   Pt will improve lumbar AROM to [facility] to improve functional mobility Baseline: Goal status: New Pt will improve 5 times sit to stand to less than 13 seconds with UE support and be able to perform at least 5 reps without UE support regardless of time. Baseline: Goal status: New Pt will improve FOTO to at least 54% functional to show improved function Baseline: Goal status: New Pt will reduce pain to overall less than 3/10 with usual activity and cooking Baseline: Goal  status: New Pt will be able to ambulate and stand at least 20 minutes without  increased pain. Baseline: Goal status: New  PLAN: PT FREQUENCY: 1-2 times per week   PT DURATION: 6 weeks  PLANNED INTERVENTIONS (unless contraindicated): aquatic PT, Canalith repositioning, cryotherapy, Electrical stimulation, Iontophoresis with 4 mg/ml dexamethasome, Moist heat, Ultrasound, gait training, Therapeutic exercise, balance training, neuromuscular re-education, patient/family education,, manual techniques, passive ROM, dry needling, taping, vasopnuematic device, vestibular, spinal manipulations, joint manipulations 97110-Therapeutic exercises, 97530- Therapeutic activity, 97112- Neuromuscular re-education, 97535- Self Care, and 16109- Manual therapy  PLAN FOR NEXT SESSION: monitor soreness and Lumbar and hip mobility/strengthening as tolerated.   Jamee Mazzoni, PT, DPT 11/29/23 9:36 AM

## 2023-12-01 ENCOUNTER — Encounter: Payer: Self-pay | Admitting: Physical Therapy

## 2023-12-01 ENCOUNTER — Ambulatory Visit: Payer: Medicare Other | Admitting: Physical Therapy

## 2023-12-01 DIAGNOSIS — R293 Abnormal posture: Secondary | ICD-10-CM

## 2023-12-01 DIAGNOSIS — M6281 Muscle weakness (generalized): Secondary | ICD-10-CM | POA: Diagnosis not present

## 2023-12-01 DIAGNOSIS — M5459 Other low back pain: Secondary | ICD-10-CM

## 2023-12-01 NOTE — Therapy (Signed)
 OUTPATIENT PHYSICAL THERAPY TREATMENT   Patient Name: [name] MRN: [id number] DOB:23-Sep-1951, 73 y.o., female Today's Date: 12/01/2023  END OF SESSION:  PT End of Session - 12/01/23 0938     Visit Number 6    Number of Visits 12    Date for PT Re-Evaluation 12/21/23    Authorization Type [employer] medicare $10 copay    Progress Note Due on Visit 10    PT Start Time 0930    PT Stop Time 1008    PT Time Calculation (min) 38 min    Activity Tolerance Patient tolerated treatment well    Behavior During Therapy WFL for tasks assessed/performed              Past Medical History:  Diagnosis Date   Allergy    Anxiety    OCC   Arthritis    COPD (chronic obstructive pulmonary disease) (HCC)    Diabetes mellitus without complication (HCC)    no meds   GERD (gastroesophageal reflux disease)    OCC   Hereditary hemochromatosis (HCC)    Hyperlipidemia    Hypertension    Vitamin D deficiency 08/2017   Past Surgical History:  Procedure Laterality Date   COLONOSCOPY     10 + years ago   FRACTURE SURGERY  1985   tibia/ Fiblia fx surgery   GANGLION CYST EXCISION  2012   post surgery infection   I & D EXTREMITY Right 07/01/2013   Procedure: IRRIGATION AND DEBRIDEMENT RIGHT LEG;  Surgeon: Nadara Mustard, MD;  Location: [facility] OR;  Service: Orthopedics;  Laterality: Right;   LAPAROSCOPIC HYSTERECTOMY     ORIF FEMUR FRACTURE Right 07/01/2013   Procedure: OPEN REDUCTION INTERNAL FIXATION (ORIF) DISTAL FEMUR FRACTURE;  Surgeon: Nadara Mustard, MD;  Location: [facility] OR;  Service: Orthopedics;  Laterality: Right;   Patient Active Problem List   Diagnosis Date Noted   Changing skin lesion 11/24/2021   Hypertension associated with diabetes (HCC) 05/28/2021   Former smoker 05/28/2021   Dyslipidemia associated with type 2 diabetes mellitus (HCC) 05/28/2021   COPD (chronic obstructive pulmonary disease) (HCC) 05/28/2021   Cataract 05/28/2021   T2DM (type 2 diabetes mellitus) (HCC) 05/28/2021    Depression, major, single episode, complete remission (HCC) 05/28/2021   Anxiety 05/28/2021   B12 deficiency 05/28/2021   Urge incontinence 05/28/2021   Hereditary hemochromatosis (HCC) 11/03/2017    PCP: Ardith Dark, MD   REFERRING PROVIDER: Patricia Nettle, MD   REFERRING DIAG: spinal stenosis, lumbar with neurogenic cladication [phone]   Rationale for Evaluation and Treatment: Rehabilitation  THERAPY DIAG:  Other low back pain  Muscle weakness (generalized)  Abnormal posture  ONSET DATE: 07/20/23 S/P L3-4 TLIF with instrumentation, L3-5 laminectomy, allograft    SUBJECTIVE:  SUBJECTIVE STATEMENT: Relays the pain is minimal, she can now stand and walk >20 minutes without increased pain. She does relay she can't navigate stairs normal and has to go down sideways.  PERTINENT HISTORY:  See above PMH  PAIN:  NPRS scale: some in right leg that is always there, no pain in back today upon arrival Pain location:low back Pain description: intermittent, ache, can be sharp Aggravating factors: bending forward Relieving factors: rest, meds   PRECAUTIONS: None  RED FLAGS: None   WEIGHT BEARING RESTRICTIONS: No  FALLS:  Has patient fallen in last 6 months? No   PLOF: Independent  PATIENT GOALS: to be able to walk longer distances and stand longer without pain, bend without pain  NEXT MD VISIT: April 2025  OBJECTIVE:  Note: Objective measures were completed at Evaluation unless otherwise noted.  DIAGNOSTIC FINDINGS:  XR 07/21/23 IMPRESSION:  Posterior rod and intrapedicular screw fusion L3-L4 without  complication.   PATIENT SURVEYS:  11/09/2023 Eval: FOTO 48% functional, goal is 54%  COGNITION: 11/09/2023 Overall cognitive status: Within functional limits for tasks  assessed     SENSATION: 11/09/2023 [facility]  LUMBAR ROM:   AROM 11/09/2023 12/01/23  Flexion 90% 100%  Extension 50% 100%  Right lateral flexion 50% 100%  Left lateral flexion 50% 100%  Right rotation 50% 100%  Left rotation 50% 100%   (Blank rows = not tested)   LOWER EXTREMITY MMT:  11/09/2023 5/5 leg strength grossly bilat but some functional weakness observed for sit to stands, squats, stairs.  Core strength:  1/21/2025poor core strength overall with movement coordination impairments  LUMBAR SPECIAL TESTS:  11/09/2023 Slump test: Positive  FUNCTIONAL TESTS:  11/09/2023 Eval: 5 times sit to stand: 15:84 seconds, needed bilat UE support  12/01/23: 5 times sit to stand no UE support 9.6 seconds  GAIT: 11/09/2023 Distance walked: limited community distances Assistive device utilized: None Level of assistance: Complete Independence Comments: decreased tolerance for full community distances                    TODAY'S TREATMENT:        DATE:  12/01/2023 Therex Nustep lvl 6 10 mins UE/LE for aerobic exercise Seated pball press down with UE 5 sec hold x 10 for ab contraction Seated pball isometirc hip flexion 5 sec X 10 bilat   Therapeutic activity:(to aid transfers, standing tolerance, squats, stairs, ambulation, lifting) Leg press DL 16# 1W96, then SL 04# 5W09 each side Standing bilat rows green 2X15 Standing shoulder extensions green 2X15 Stairs in clinic hall up/down one flight with single handrail on Rt going up. Supervision with this able to perform reciprocal but more difficulty noted in Rt leg compared to left.  DATE:  11/29/2023 Therex Nustep lvl 6 10 mins UE/LE for aerobic exercise Seated pball press down with UE 5 sec hold x 10 for ab contraction Seated pball isometirc hip flexion 5 sec X 10 bilat Seated pball L stretch 5 sec X 10 Seated yellow ball lumbar extension isometric 5 sec X 10   Therapeutic activity:(to aid transfers, standing tolerance, squats,  stairs, ambulation, lifting) Leg press DL 81# 1B14, then SL 78# 2N56 each side Standing bilat rows green 2X15 Standing shoulder extensions green 2X15 Standing hip abductions red 2X10 Standing hip extensions red 2X10 Suitcase carry 10# 100 feet each arm Standing L stretch X 10 holding flexion 5 sec then holding extension 5 sec  DATE:  11/23/2023 Therex Nustep lvl 6 10 mins UE/LE for aerobic exercise  Seated pball press down with UE 5 sec hold x 10 for ab contraction Seated pball isometirc hip flexion 5 sec X 10 bilat   Therapeutic activity:(to aid transfers, standing tolerance, squats, stairs, ambulation, lifting) Leg press DL 40# 9W11 Single leg press 50# X 15 each side Standing alternating unilateral rows with trunk rotation green X 15 each side Standing hip abductions red 2X10 Standing hip extensions red 2X10 Suitcase carry 5# 100 feet each arm Standing L stretch X 10 holding flexion 5 sec then holding extension 5 sec    DATE:  11/23/2023 Therex Nustep lvl 5 10 mins UE/LE for aerobic exercise Standing L stretch 5 sec X 10 Supine lumbar trunk rotation 15 sec x 3 bilateral Supine SKC 15 sec x3 bilateral  Supine hooklying glute set 5 sec hold x 10 (unable to due to bridge) Supine hooklying hip abduction x 20 bilateral blue band Seated pball press down with UE 5 sec hold x 10 for ab contraction Seated pball isometirc hip flexion 5 sec X 10 bilat Standing alternating unilateral rows with trunk rotation green X 15 each side   PATIENT EDUCATION: Education details: HEP, PT plan of care Person educated: Patient Education method: Explanation, Demonstration, Verbal cues, and Handouts Education comprehension: verbalized understanding and needs further education   HOME EXERCISE PROGRAM: Access Code: [email or web address] URL: [email or web address] Date: 11/09/2023 Prepared by: Ivery Quale  Exercises - Supine Lower Trunk Rotation  - 2 x daily - 6 x weekly - 1 sets - 5 reps - 10  sec hold - Hooklying Single Knee to Chest Stretch  - 2 x daily - 6 x weekly - 1 sets - 2 reps - 20 sec hold - Slump Stretch  - 2 x daily - 6 x weekly - 1 sets - 10 reps - Standing 'L' Stretch at Counter  - 2 x daily - 6 x weekly - 1 sets - 10 reps - 5 hold  ASSESSMENT:  CLINICAL IMPRESSION: She is making great progress with PT and has now met several PT goals. She is on track to finish up early in about 2 weeks depending on progress. She does have some difficulty still with stairs with right leg and we will work to improve this in upcoming visits.  OBJECTIVE IMPAIRMENTS: decreased activity tolerance, difficulty walking, decreased balance, decreased endurance, decreased mobility, decreased ROM, decreased strength, impaired flexibility, impaired UE/LE use, postural dysfunction, and pain.  ACTIVITY LIMITATIONS: bending, lifting, carry, locomotion, cleaning, community activity,  PERSONAL FACTORS: see above PMH are also affecting patient's functional outcome.  REHAB POTENTIAL: Good  CLINICAL DECISION MAKING: Stable/uncomplicated  EVALUATION COMPLEXITY: Low    GOALS: Short term PT Goals Target date: 12/07/2023   Pt will be I and compliant with HEP. Baseline:  Goal status: MET 12/01/23 Pt will decrease pain by 25% overall Baseline: Goal status: met 12/01/23  Long term PT goals Target date:12/21/2023   Pt will improve lumbar AROM to [facility] to improve functional mobility Baseline: Goal status: MET 12/01/23 Pt will improve 5 times sit to stand to less than 13 seconds with UE support and be able to perform at least 5 reps without UE support regardless of time. Baseline: Goal status: MET 12/01/23 Pt will improve FOTO to at least 54% functional to show improved function Baseline: Goal status: ongoing 12/01/23 Pt will reduce pain to overall less than 3/10 with usual activity and cooking Baseline: Goal status: ongoing 12/01/23 Pt will be able to ambulate and stand at least 20 minutes without  increased  pain. Baseline: Goal status: ongoing 12/01/23  PLAN: PT FREQUENCY: 1-2 times per week   PT DURATION: 6 weeks  PLANNED INTERVENTIONS (unless contraindicated): aquatic PT, Canalith repositioning, cryotherapy, Electrical stimulation, Iontophoresis with 4 mg/ml dexamethasome, Moist heat, Ultrasound, gait training, Therapeutic exercise, balance training, neuromuscular re-education, patient/family education,, manual techniques, passive ROM, dry needling, taping, vasopnuematic device, vestibular, spinal manipulations, joint manipulations 97110-Therapeutic exercises, 97530- Therapeutic activity, 97112- Neuromuscular re-education, 97535- Self Care, and 78295- Manual therapy  PLAN FOR NEXT SESSION: monitor soreness and Lumbar and hip mobility/strengthening as tolerated.  Work on Actuary with stairs  Ivery Quale, PT, DPT 12/01/23 9:46 AM

## 2023-12-13 ENCOUNTER — Encounter: Payer: Self-pay | Admitting: Rehabilitative and Restorative Service Providers"

## 2023-12-13 ENCOUNTER — Ambulatory Visit: Payer: Medicare Other | Admitting: Rehabilitative and Restorative Service Providers"

## 2023-12-13 DIAGNOSIS — M5459 Other low back pain: Secondary | ICD-10-CM

## 2023-12-13 DIAGNOSIS — M6281 Muscle weakness (generalized): Secondary | ICD-10-CM

## 2023-12-13 NOTE — Therapy (Signed)
 OUTPATIENT PHYSICAL THERAPY TREATMENT   Patient Name: [name] MRN: [id number] DOB:March 26, 1951, 73 y.o., female Today's Date: 12/13/2023  END OF SESSION:  PT End of Session - 12/13/23 0957     Visit Number 7    Number of Visits 12    Date for PT Re-Evaluation 12/21/23    Authorization Type [employer] medicare $10 copay    Progress Note Due on Visit 10    PT Start Time 0934    PT Stop Time 1028    PT Time Calculation (min) 54 min    Activity Tolerance Patient tolerated treatment well    Behavior During Therapy [facility] for tasks assessed/performed               Past Medical History:  Diagnosis Date   Allergy    Anxiety    OCC   Arthritis    COPD (chronic obstructive pulmonary disease) (HCC)    Diabetes mellitus without complication (HCC)    no meds   GERD (gastroesophageal reflux disease)    OCC   Hereditary hemochromatosis (HCC)    Hyperlipidemia    Hypertension    Vitamin D deficiency 08/2017   Past Surgical History:  Procedure Laterality Date   COLONOSCOPY     10 + years ago   FRACTURE SURGERY  1985   tibia/ Fiblia fx surgery   GANGLION CYST EXCISION  2012   post surgery infection   I & D EXTREMITY Right 07/01/2013   Procedure: IRRIGATION AND DEBRIDEMENT RIGHT LEG;  Surgeon: Nadara Mustard, MD;  Location: [facility] OR;  Service: Orthopedics;  Laterality: Right;   LAPAROSCOPIC HYSTERECTOMY     ORIF FEMUR FRACTURE Right 07/01/2013   Procedure: OPEN REDUCTION INTERNAL FIXATION (ORIF) DISTAL FEMUR FRACTURE;  Surgeon: Nadara Mustard, MD;  Location: [facility] OR;  Service: Orthopedics;  Laterality: Right;   Patient Active Problem List   Diagnosis Date Noted   Changing skin lesion 11/24/2021   Hypertension associated with diabetes (HCC) 05/28/2021   Former smoker 05/28/2021   Dyslipidemia associated with type 2 diabetes mellitus (HCC) 05/28/2021   COPD (chronic obstructive pulmonary disease) (HCC) 05/28/2021   Cataract 05/28/2021   T2DM (type 2 diabetes mellitus) (HCC)  05/28/2021   Depression, major, single episode, complete remission (HCC) 05/28/2021   Anxiety 05/28/2021   B12 deficiency 05/28/2021   Urge incontinence 05/28/2021   Hereditary hemochromatosis (HCC) 11/03/2017    PCP: Ardith Dark, MD   REFERRING PROVIDER: Patricia Nettle, MD   REFERRING DIAG: spinal stenosis, lumbar with neurogenic cladication [phone]   Rationale for Evaluation and Treatment: Rehabilitation  THERAPY DIAG:  Other low back pain  Muscle weakness (generalized)  ONSET DATE: 07/20/23 S/P L3-4 TLIF with instrumentation, L3-5 laminectomy, allograft    SUBJECTIVE:  SUBJECTIVE STATEMENT: Pt reported "feeing fine today."  Pt indicated no specific pain upon arrival today.  Pt indicated doing housework with standing.   PERTINENT HISTORY:  See above PMH  PAIN:  NPRS scale: no pain upon arrival.  Up to 8/10.  Pain location:low back Pain description: intermittent, ache, can be sharp Aggravating factors: standing prolonged housework Relieving factors: rest, meds   PRECAUTIONS: None  RED FLAGS: None   WEIGHT BEARING RESTRICTIONS: No  FALLS:  Has patient fallen in last 6 months? No   PLOF: Independent  PATIENT GOALS: to be able to walk longer distances and stand longer without pain, bend without pain  NEXT MD VISIT: April 2025  OBJECTIVE:  Note: Objective measures were completed at Evaluation unless otherwise noted.  DIAGNOSTIC FINDINGS:  XR 07/21/23 IMPRESSION:  Posterior rod and intrapedicular screw fusion L3-L4 without  complication.   PATIENT SURVEYS:  11/09/2023 Eval: FOTO 48% functional, goal is 54%  COGNITION: 11/09/2023 Overall cognitive status: Within functional limits for tasks assessed     SENSATION: 11/09/2023 [facility]  LUMBAR ROM:   AROM 11/09/2023  12/01/23  Flexion 90% 100%  Extension 50% 100%  Right lateral flexion 50% 100%  Left lateral flexion 50% 100%  Right rotation 50% 100%  Left rotation 50% 100%   (Blank rows = not tested)   LOWER EXTREMITY MMT:  11/09/2023 5/5 leg strength grossly bilat but some functional weakness observed for sit to stands, squats, stairs.  Core strength:  1/21/2025poor core strength overall with movement coordination impairments  LUMBAR SPECIAL TESTS:  11/09/2023 Slump test: Positive  FUNCTIONAL TESTS:  12/13/2023:  Cues needed to promote log rolling in supine to sit mobility.   12/01/23: 5 times sit to stand no UE support 9.6 seconds  11/09/2023 Eval: 5 times sit to stand: 15:84 seconds, needed bilat UE support    GAIT: 11/09/2023 Distance walked: limited community distances Assistive device utilized: None Level of assistance: Complete Independence Comments: decreased tolerance for full community distances                    TODAY'S TREATMENT:       DATE:  12/13/2023 Therex Nustep lvl 6 10 mins LE for aerobic exercise Supine bridge x 25 Supine SKC 15 sec x 3 bilateral  Supine hooklying clam shell green band x 20 bilateral   Neuro Re-ed (postural recruitment, activation)  Tband rows green band 2 x 15 Tband gh ext green band 2 x 15  Supine pball press down with UE 5 sec hold x 10 for ab contraction Supine TrA contraction hold focus with marching 2 x 10   Therapeutic activity:(to aid transfers, standing tolerance, squats, stairs, ambulation, lifting) Leg press DL 16# X09, then SL 60# 4V40 each side Supine to sit to supine log roll transfer process with verbal and visual cues with trial x 2 Verbal instruction cues for floor to stand tranfers with use of chair or elevated surface to assist with UE and half kneeling mid point with Lt leg WB.   TODAY'S TREATMENT:       DATE:  12/01/2023 Therex Nustep lvl 6 10 mins UE/LE for aerobic exercise Seated pball press down with UE 5 sec hold x 10  for ab contraction Seated pball isometirc hip flexion 5 sec X 10 bilat   Therapeutic activity:(to aid transfers, standing tolerance, squats, stairs, ambulation, lifting) Leg press DL 98# 1X91, then SL 47# 8G95 each side Standing bilat rows green 2X15 Standing shoulder extensions green 2X15 Stairs  in clinic hall up/down one flight with single handrail on Rt going up. Supervision with this able to perform reciprocal but more difficulty noted in Rt leg compared to left.  TODAY'S TREATMENT:       DATE:  11/29/2023 Therex Nustep lvl 6 10 mins UE/LE for aerobic exercise Seated pball press down with UE 5 sec hold x 10 for ab contraction Seated pball isometirc hip flexion 5 sec X 10 bilat Seated pball L stretch 5 sec X 10 Seated yellow ball lumbar extension isometric 5 sec X 10   Therapeutic activity:(to aid transfers, standing tolerance, squats, stairs, ambulation, lifting) Leg press DL 56# 2Z30, then SL 86# 5H84 each side Standing bilat rows green 2X15 Standing shoulder extensions green 2X15 Standing hip abductions red 2X10 Standing hip extensions red 2X10 Suitcase carry 10# 100 feet each arm Standing L stretch X 10 holding flexion 5 sec then holding extension 5 sec  TODAY'S TREATMENT:       DATE:  11/23/2023 Therex Nustep lvl 6 10 mins UE/LE for aerobic exercise Seated pball press down with UE 5 sec hold x 10 for ab contraction Seated pball isometirc hip flexion 5 sec X 10 bilat   Therapeutic activity:(to aid transfers, standing tolerance, squats, stairs, ambulation, lifting) Leg press DL 69# 6E95 Single leg press 50# X 15 each side Standing alternating unilateral rows with trunk rotation green X 15 each side Standing hip abductions red 2X10 Standing hip extensions red 2X10 Suitcase carry 5# 100 feet each arm Standing L stretch X 10 holding flexion 5 sec then holding extension 5 sec     PATIENT EDUCATION: Education details: HEP, PT plan of care Person educated:  Patient Education method: Explanation, Demonstration, Verbal cues, and Handouts Education comprehension: verbalized understanding and needs further education   HOME EXERCISE PROGRAM: Access Code: [email or web address] URL: [email or web address] Date: 11/09/2023 Prepared by: Ivery Quale  Exercises - Supine Lower Trunk Rotation  - 2 x daily - 6 x weekly - 1 sets - 5 reps - 10 sec hold - Hooklying Single Knee to Chest Stretch  - 2 x daily - 6 x weekly - 1 sets - 2 reps - 20 sec hold - Slump Stretch  - 2 x daily - 6 x weekly - 1 sets - 10 reps - Standing 'L' Stretch at Counter  - 2 x daily - 6 x weekly - 1 sets - 10 reps - 5 hold  ASSESSMENT:  CLINICAL IMPRESSION: Pt to continue to benefit from skilled PT services to help improve lumbar mobility, general core and LE strengthening to improve tolerance and mobility with daily activity.  Pt may continue to benefit from review of transfer training to help improve progressive mobility.   OBJECTIVE IMPAIRMENTS: decreased activity tolerance, difficulty walking, decreased balance, decreased endurance, decreased mobility, decreased ROM, decreased strength, impaired flexibility, impaired UE/LE use, postural dysfunction, and pain.  ACTIVITY LIMITATIONS: bending, lifting, carry, locomotion, cleaning, community activity,  PERSONAL FACTORS: see above PMH are also affecting patient's functional outcome.  REHAB POTENTIAL: Good  CLINICAL DECISION MAKING: Stable/uncomplicated  EVALUATION COMPLEXITY: Low    GOALS: Short term PT Goals Target date: 12/07/2023   Pt will be I and compliant with HEP. Baseline:  Goal status: MET 12/01/23 Pt will decrease pain by 25% overall Baseline: Goal status: met 12/01/23  Long term PT goals Target date:12/21/2023   Pt will improve lumbar AROM to [facility] to improve functional mobility Baseline: Goal status: MET 12/01/23 Pt will improve 5 times sit to stand to less  than 13 seconds with UE support and be able to  perform at least 5 reps without UE support regardless of time. Baseline: Goal status: MET 12/01/23 Pt will improve FOTO to at least 54% functional to show improved function Baseline: Goal status: ongoing 12/01/23 Pt will reduce pain to overall less than 3/10 with usual activity and cooking Baseline: Goal status: ongoing 12/01/23 Pt will be able to ambulate and stand at least 20 minutes without increased pain. Baseline: Goal status: ongoing 12/01/23  PLAN: PT FREQUENCY: 1-2 times per week   PT DURATION: 6 weeks  PLANNED INTERVENTIONS (unless contraindicated): aquatic PT, Canalith repositioning, cryotherapy, Electrical stimulation, Iontophoresis with 4 mg/ml dexamethasome, Moist heat, Ultrasound, gait training, Therapeutic exercise, balance training, neuromuscular re-education, patient/family education,, manual techniques, passive ROM, dry needling, taping, vasopnuematic device, vestibular, spinal manipulations, joint manipulations 97110-Therapeutic exercises, 97530- Therapeutic activity, 97112- Neuromuscular re-education, 97535- Self Care, and 16109- Manual therapy  PLAN FOR NEXT SESSION: Recheck strength in LE.     Chyrel Masson, PT, DPT, OCS, ATC 12/13/23  10:34 AM

## 2023-12-15 ENCOUNTER — Encounter: Payer: Self-pay | Admitting: Rehabilitative and Restorative Service Providers"

## 2023-12-15 ENCOUNTER — Ambulatory Visit: Payer: Medicare Other | Admitting: Rehabilitative and Restorative Service Providers"

## 2023-12-15 DIAGNOSIS — M5459 Other low back pain: Secondary | ICD-10-CM | POA: Diagnosis not present

## 2023-12-15 DIAGNOSIS — M6281 Muscle weakness (generalized): Secondary | ICD-10-CM | POA: Diagnosis not present

## 2023-12-15 NOTE — Therapy (Addendum)
 OUTPATIENT PHYSICAL THERAPY TREATMENT   Patient Name: [name] MRN: [id number] DOB:12-02-1950, 73 y.o., female Today's Date: 12/15/2023  END OF SESSION:  PT End of Session - 12/15/23 0942     Visit Number 8    Number of Visits 12    Date for PT Re-Evaluation 12/21/23    Authorization Type [employer] medicare $10 copay    Progress Note Due on Visit 10    PT Start Time [phone]    PT Stop Time 1017    PT Time Calculation (min) 39 min    Activity Tolerance Patient tolerated treatment well    Behavior During Therapy [facility] for tasks assessed/performed                Past Medical History:  Diagnosis Date   Allergy    Anxiety    OCC   Arthritis    COPD (chronic obstructive pulmonary disease) (HCC)    Diabetes mellitus without complication (HCC)    no meds   GERD (gastroesophageal reflux disease)    OCC   Hereditary hemochromatosis (HCC)    Hyperlipidemia    Hypertension    Vitamin D deficiency 08/2017   Past Surgical History:  Procedure Laterality Date   COLONOSCOPY     10 + years ago   FRACTURE SURGERY  1985   tibia/ Fiblia fx surgery   GANGLION CYST EXCISION  2012   post surgery infection   I & D EXTREMITY Right 07/01/2013   Procedure: IRRIGATION AND DEBRIDEMENT RIGHT LEG;  Surgeon: Nadara Mustard, MD;  Location: [facility] OR;  Service: Orthopedics;  Laterality: Right;   LAPAROSCOPIC HYSTERECTOMY     ORIF FEMUR FRACTURE Right 07/01/2013   Procedure: OPEN REDUCTION INTERNAL FIXATION (ORIF) DISTAL FEMUR FRACTURE;  Surgeon: Nadara Mustard, MD;  Location: [facility] OR;  Service: Orthopedics;  Laterality: Right;   Patient Active Problem List   Diagnosis Date Noted   Changing skin lesion 11/24/2021   Hypertension associated with diabetes (HCC) 05/28/2021   Former smoker 05/28/2021   Dyslipidemia associated with type 2 diabetes mellitus (HCC) 05/28/2021   COPD (chronic obstructive pulmonary disease) (HCC) 05/28/2021   Cataract 05/28/2021   T2DM (type 2 diabetes mellitus) (HCC)  05/28/2021   Depression, major, single episode, complete remission (HCC) 05/28/2021   Anxiety 05/28/2021   B12 deficiency 05/28/2021   Urge incontinence 05/28/2021   Hereditary hemochromatosis (HCC) 11/03/2017    PCP: Ardith Dark, MD   REFERRING PROVIDER: Patricia Nettle, MD   REFERRING DIAG: spinal stenosis, lumbar with neurogenic cladication [phone]   Rationale for Evaluation and Treatment: Rehabilitation  THERAPY DIAG:  Other low back pain  Muscle weakness (generalized)  ONSET DATE: 07/20/23 S/P L3-4 TLIF with instrumentation, L3-5 laminectomy, allograft    SUBJECTIVE:  SUBJECTIVE STATEMENT: Reports feeling fine and has been doing ok since last visit.    PERTINENT HISTORY:  See above PMH  PAIN:  NPRS scale: no pain upon arrival.  Pain location:low back Pain description: intermittent, ache, can be sharp Aggravating factors: standing prolonged housework Relieving factors: rest, meds   PRECAUTIONS: None  RED FLAGS: None   WEIGHT BEARING RESTRICTIONS: No  FALLS:  Has patient fallen in last 6 months? No   PLOF: Independent  PATIENT GOALS: to be able to walk longer distances and stand longer without pain, bend without pain  NEXT MD VISIT: April 2025  OBJECTIVE:  Note: Objective measures were completed at Evaluation unless otherwise noted.  DIAGNOSTIC FINDINGS:  XR 07/21/23 IMPRESSION:  Posterior rod and intrapedicular screw fusion L3-L4 without  complication.   PATIENT SURVEYS:  12/15/2023: 56%  11/09/2023 Eval: FOTO 48% functional, goal is 54%  COGNITION: 11/09/2023 Overall cognitive status: Within functional limits for tasks assessed     SENSATION: 11/09/2023 [facility]  LUMBAR ROM:   AROM 11/09/2023 12/01/23  Flexion 90% 100%  Extension 50% 100%  Right lateral  flexion 50% 100%  Left lateral flexion 50% 100%  Right rotation 50% 100%  Left rotation 50% 100%   (Blank rows = not tested)   LOWER EXTREMITY MMT:  11/09/2023 5/5 leg strength grossly bilat but some functional weakness observed for sit to stands, squats, stairs.  Core strength:  1/21/2025poor core strength overall with movement coordination impairments  LUMBAR SPECIAL TESTS:  11/09/2023 Slump test: Positive  FUNCTIONAL TESTS:  12/13/2023:  Cues needed to promote log rolling in supine to sit mobility.   12/01/23: 5 times sit to stand no UE support 9.6 seconds  11/09/2023 Eval: 5 times sit to stand: 15:84 seconds, needed bilat UE support    GAIT: 11/09/2023 Distance walked: limited community distances Assistive device utilized: None Level of assistance: Complete Independence Comments: decreased tolerance for full community distances                   TODAY'S TREATMENT:       DATE:  12/15/2023 Therex Supine SKC 15 sec x 3 bilateral Supine bridge 2x10 with 2-5sec hold and controled lower Supine hooklying clam shell green band 3x10 bilateral Leg press DL 19# J47, then SL 82# 9F62 each side  Neuro Re-ed  Rows with green tband 2x15 Extension with green tband 2x15  Dead bug UE only then LE only with TA draw x10 ea; vc for TA draw and breathing  TODAY'S TREATMENT:       DATE:  12/13/2023 Therex Nustep lvl 6 10 mins LE for aerobic exercise Supine bridge x 25 Supine SKC 15 sec x 3 bilateral  Supine hooklying clam shell green band x 20 bilateral   Neuro Re-ed (postural recruitment, activation)  Tband rows green band 2 x 15 Tband gh ext green band 2 x 15  Supine pball press down with UE 5 sec hold x 10 for ab contraction Supine TrA contraction hold focus with marching 2 x 10   Therapeutic activity:(to aid transfers, standing tolerance, squats, stairs, ambulation, lifting) Leg press DL 13# Y86, then SL 57# 8I69 each side Supine to sit to supine log roll transfer process with  verbal and visual cues with trial x 2 Verbal instruction cues for floor to stand tranfers with use of chair or elevated surface to assist with UE and half kneeling mid point with Lt leg WB.   TODAY'S TREATMENT:       DATE:  12/01/2023 Therex Nustep lvl 6 10 mins UE/LE for aerobic exercise Seated pball press down with UE 5 sec hold x 10 for ab contraction Seated pball isometirc hip flexion 5 sec X 10 bilat   Therapeutic activity:(to aid transfers, standing tolerance, squats, stairs, ambulation, lifting) Leg press DL 13# 0Q65, then SL 78# 4O96 each side Standing bilat rows green 2X15 Standing shoulder extensions green 2X15 Stairs in clinic hall up/down one flight with single handrail on Rt going up. Supervision with this able to perform reciprocal but more difficulty noted in Rt leg compared to left.  TODAY'S TREATMENT:       DATE:  11/29/2023 Therex Nustep lvl 6 10 mins UE/LE for aerobic exercise Seated pball press down with UE 5 sec hold x 10 for ab contraction Seated pball isometirc hip flexion 5 sec X 10 bilat Seated pball L stretch 5 sec X 10 Seated yellow ball lumbar extension isometric 5 sec X 10   Therapeutic activity:(to aid transfers, standing tolerance, squats, stairs, ambulation, lifting) Leg press DL 29# 5M84, then SL 13# 2G40 each side Standing bilat rows green 2X15 Standing shoulder extensions green 2X15 Standing hip abductions red 2X10 Standing hip extensions red 2X10 Suitcase carry 10# 100 feet each arm Standing L stretch X 10 holding flexion 5 sec then holding extension 5 sec   PATIENT EDUCATION: Education details: HEP, PT plan of care Person educated: Patient Education method: Explanation, Demonstration, Verbal cues, and Handouts Education comprehension: verbalized understanding and needs further education   HOME EXERCISE PROGRAM: Access Code: [email or web address] URL: [email or web address] Date: 11/09/2023 Prepared by: Ivery Quale  Exercises -  Supine Lower Trunk Rotation  - 2 x daily - 6 x weekly - 1 sets - 5 reps - 10 sec hold - Hooklying Single Knee to Chest Stretch  - 2 x daily - 6 x weekly - 1 sets - 2 reps - 20 sec hold - Slump Stretch  - 2 x daily - 6 x weekly - 1 sets - 10 reps - Standing 'L' Stretch at Counter  - 2 x daily - 6 x weekly - 1 sets - 10 reps - 5 hold  ASSESSMENT:  CLINICAL IMPRESSION: Patient updated FOTO score increased from eval actually exceeding predicted value. Patient did well with activity and demonstrated increased ability to do activities without pain and in larger ranges. Patient will benefit from continued skilled physical therapy to address remaining deficits and residual deficits.   OBJECTIVE IMPAIRMENTS: decreased activity tolerance, difficulty walking, decreased balance, decreased endurance, decreased mobility, decreased ROM, decreased strength, impaired flexibility, impaired UE/LE use, postural dysfunction, and pain.  ACTIVITY LIMITATIONS: bending, lifting, carry, locomotion, cleaning, community activity,  PERSONAL FACTORS: see above PMH are also affecting patient's functional outcome.  REHAB POTENTIAL: Good  CLINICAL DECISION MAKING: Stable/uncomplicated  EVALUATION COMPLEXITY: Low    GOALS: Short term PT Goals Target date: 12/07/2023   Pt will be I and compliant with HEP. Baseline:  Goal status: MET 12/01/23 Pt will decrease pain by 25% overall Baseline: Goal status: met 12/01/23  Long term PT goals Target date:12/21/2023   Pt will improve lumbar AROM to [facility] to improve functional mobility Baseline: Goal status: MET 12/01/23 Pt will improve 5 times sit to stand to less than 13 seconds with UE support and be able to perform at least 5 reps without UE support regardless of time. Baseline: Goal status: MET 12/01/23 Pt will improve FOTO to at least 54% functional to show improved function Baseline: Goal status: ongoing 12/01/23 Pt will  reduce pain to overall less than 3/10 with usual  activity and cooking Baseline: Goal status: ongoing 12/01/23 Pt will be able to ambulate and stand at least 20 minutes without increased pain. Baseline: Goal status: ongoing 12/01/23  PLAN: PT FREQUENCY: 1-2 times per week   PT DURATION: 6 weeks  PLANNED INTERVENTIONS (unless contraindicated): aquatic PT, Canalith repositioning, cryotherapy, Electrical stimulation, Iontophoresis with 4 mg/ml dexamethasome, Moist heat, Ultrasound, gait training, Therapeutic exercise, balance training, neuromuscular re-education, patient/family education,, manual techniques, passive ROM, dry needling, taping, vasopnuematic device, vestibular, spinal manipulations, joint manipulations 97110-Therapeutic exercises, 97530- Therapeutic activity, 97112- Neuromuscular re-education, 97535- Self Care, and 13244- Manual therapy  PLAN FOR NEXT SESSION: Continue strengthening of LE and core to tolerance. May progress bridges as tolerated for increased balance or strengthening challenges.    Deedee Lybarger, Memorie Yokoyama, Student-PT 12/15/2023, 12:06 PM

## 2023-12-20 ENCOUNTER — Encounter: Payer: Self-pay | Admitting: Physical Therapy

## 2023-12-20 ENCOUNTER — Ambulatory Visit: Payer: Medicare Other | Admitting: Physical Therapy

## 2023-12-20 DIAGNOSIS — M5459 Other low back pain: Secondary | ICD-10-CM

## 2023-12-20 DIAGNOSIS — R293 Abnormal posture: Secondary | ICD-10-CM

## 2023-12-20 DIAGNOSIS — M6281 Muscle weakness (generalized): Secondary | ICD-10-CM | POA: Diagnosis not present

## 2023-12-20 NOTE — Therapy (Signed)
 OUTPATIENT PHYSICAL THERAPY TREATMENT   Patient Name: [name] MRN: [id number] DOB:Mar 07, 1951, 73 y.o., female Today's Date: 12/20/2023  END OF SESSION:  PT End of Session - 12/20/23 0936     Visit Number 9    Number of Visits 12    Date for PT Re-Evaluation 12/21/23    Authorization Type [employer] medicare $10 copay    Progress Note Due on Visit 10    PT Start Time [phone]    PT Stop Time 1015    PT Time Calculation (min) 39 min    Activity Tolerance Patient tolerated treatment well    Behavior During Therapy [facility] for tasks assessed/performed                 Past Medical History:  Diagnosis Date   Allergy    Anxiety    OCC   Arthritis    COPD (chronic obstructive pulmonary disease) (HCC)    Diabetes mellitus without complication (HCC)    no meds   GERD (gastroesophageal reflux disease)    OCC   Hereditary hemochromatosis (HCC)    Hyperlipidemia    Hypertension    Vitamin D deficiency 08/2017   Past Surgical History:  Procedure Laterality Date   COLONOSCOPY     10 + years ago   FRACTURE SURGERY  1985   tibia/ Fiblia fx surgery   GANGLION CYST EXCISION  2012   post surgery infection   I & D EXTREMITY Right 07/01/2013   Procedure: IRRIGATION AND DEBRIDEMENT RIGHT LEG;  Surgeon: Nadara Mustard, MD;  Location: [facility] OR;  Service: Orthopedics;  Laterality: Right;   LAPAROSCOPIC HYSTERECTOMY     ORIF FEMUR FRACTURE Right 07/01/2013   Procedure: OPEN REDUCTION INTERNAL FIXATION (ORIF) DISTAL FEMUR FRACTURE;  Surgeon: Nadara Mustard, MD;  Location: [facility] OR;  Service: Orthopedics;  Laterality: Right;   Patient Active Problem List   Diagnosis Date Noted   Changing skin lesion 11/24/2021   Hypertension associated with diabetes (HCC) 05/28/2021   Former smoker 05/28/2021   Dyslipidemia associated with type 2 diabetes mellitus (HCC) 05/28/2021   COPD (chronic obstructive pulmonary disease) (HCC) 05/28/2021   Cataract 05/28/2021   T2DM (type 2 diabetes mellitus) (HCC)  05/28/2021   Depression, major, single episode, complete remission (HCC) 05/28/2021   Anxiety 05/28/2021   B12 deficiency 05/28/2021   Urge incontinence 05/28/2021   Hereditary hemochromatosis (HCC) 11/03/2017    PCP: Ardith Dark, MD   REFERRING PROVIDER: Patricia Nettle, MD   REFERRING DIAG: spinal stenosis, lumbar with neurogenic cladication [phone]   Rationale for Evaluation and Treatment: Rehabilitation  THERAPY DIAG:  Other low back pain  Muscle weakness (generalized)  Abnormal posture  ONSET DATE: 07/20/23 S/P L3-4 TLIF with instrumentation, L3-5 laminectomy, allograft    SUBJECTIVE:  SUBJECTIVE STATEMENT: Reports back is a little sore after working in yard this weekend, wants to know if she can crank lawnmower    PERTINENT HISTORY:  See above PMH  PAIN:  NPRS scale: no pain upon arrival.  Pain location:low back Pain description: intermittent, ache, can be sharp Aggravating factors: standing prolonged housework Relieving factors: rest, meds   PRECAUTIONS: None  RED FLAGS: None   WEIGHT BEARING RESTRICTIONS: No  FALLS:  Has patient fallen in last 6 months? No   PLOF: Independent  PATIENT GOALS: to be able to walk longer distances and stand longer without pain, bend without pain  NEXT MD VISIT: April 2025  OBJECTIVE:  Note: Objective measures were completed at Evaluation unless otherwise noted.  DIAGNOSTIC FINDINGS:  XR 07/21/23 IMPRESSION:  Posterior rod and intrapedicular screw fusion L3-L4 without  complication.   PATIENT SURVEYS:  12/15/2023: 56%  11/09/2023 Eval: FOTO 48% functional, goal is 54%  COGNITION: 11/09/2023 Overall cognitive status: Within functional limits for tasks assessed     SENSATION: 11/09/2023 [facility]  LUMBAR ROM:   AROM 11/09/2023  12/01/23  Flexion 90% 100%  Extension 50% 100%  Right lateral flexion 50% 100%  Left lateral flexion 50% 100%  Right rotation 50% 100%  Left rotation 50% 100%   (Blank rows = not tested)   LOWER EXTREMITY MMT:  11/09/2023 5/5 leg strength grossly bilat but some functional weakness observed for sit to stands, squats, stairs.  Core strength:  1/21/2025poor core strength overall with movement coordination impairments  LUMBAR SPECIAL TESTS:  11/09/2023 Slump test: Positive  FUNCTIONAL TESTS:  12/13/2023:  Cues needed to promote log rolling in supine to sit mobility.   12/01/23: 5 times sit to stand no UE support 9.6 seconds  11/09/2023 Eval: 5 times sit to stand: 15:84 seconds, needed bilat UE support    GAIT: 11/09/2023 Distance walked: limited community distances Assistive device utilized: None Level of assistance: Complete Independence Comments: decreased tolerance for full community distances                   TODAY'S TREATMENT:       DATE:  12/20/2023 Therex Supine SKC 15 sec x 3 bilateral Supine LTR 5 sec X 10 bilat Supine hooklying clam shell blue band 2X15 bilateral  theractivity Leg press DL 81# X91, then SL 47# [id number] each side Rows with green tband 2x15 Extension with green tband 2x15  Bent over low to mid row to simulate cranking lawnmower green 2X120  TODAY'S TREATMENT:       DATE:  12/15/2023 Therex Supine SKC 15 sec x 3 bilateral Supine bridge 2x10 with 2-5sec hold and controled lower Supine hooklying clam shell green band 3x10 bilateral Leg press DL 62# Z30, then SL 86# [id number] each side  Neuro Re-ed  Rows with green tband 2x15 Extension with green tband 2x15  Dead bug UE only then LE only with TA draw x10 ea; vc for TA draw and breathing     PATIENT EDUCATION: Education details: HEP, PT plan of care Person educated: Patient Education method: Explanation, Demonstration, Verbal cues, and Handouts Education comprehension: verbalized understanding and  needs further education   HOME EXERCISE PROGRAM: Access Code: [email or web address] URL: [email or web address] Date: 11/09/2023 Prepared by: Ivery Quale  Exercises - Supine Lower Trunk Rotation  - 2 x daily - 6 x weekly - 1 sets - 5 reps - 10 sec hold - Hooklying Single Knee to Chest Stretch  - 2 x daily - 6 x weekly -  1 sets - 2 reps - 20 sec hold - Slump Stretch  - 2 x daily - 6 x weekly - 1 sets - 10 reps - Standing 'L' Stretch at Counter  - 2 x daily - 6 x weekly - 1 sets - 10 reps - 5 hold  ASSESSMENT:  CLINICAL IMPRESSION: She had some soreness in back from yardwork but despite this had good tolerance to exercises today except bridges which were held. I did give her exercise with theraband to simulate cranking lawnmower and she is able to do this with good technique so I do feel she may try this at home.  OBJECTIVE IMPAIRMENTS: decreased activity tolerance, difficulty walking, decreased balance, decreased endurance, decreased mobility, decreased ROM, decreased strength, impaired flexibility, impaired UE/LE use, postural dysfunction, and pain.  ACTIVITY LIMITATIONS: bending, lifting, carry, locomotion, cleaning, community activity,  PERSONAL FACTORS: see above PMH are also affecting patient's functional outcome.  REHAB POTENTIAL: Good  CLINICAL DECISION MAKING: Stable/uncomplicated  EVALUATION COMPLEXITY: Low    GOALS: Short term PT Goals Target date: 12/07/2023   Pt will be I and compliant with HEP. Baseline:  Goal status: MET 12/01/23 Pt will decrease pain by 25% overall Baseline: Goal status: met 12/01/23  Long term PT goals Target date:12/21/2023   Pt will improve lumbar AROM to [facility] to improve functional mobility Baseline: Goal status: MET 12/01/23 Pt will improve 5 times sit to stand to less than 13 seconds with UE support and be able to perform at least 5 reps without UE support regardless of time. Baseline: Goal status: MET 12/01/23 Pt will improve FOTO to  at least 54% functional to show improved function Baseline: Goal status: ongoing 12/01/23 Pt will reduce pain to overall less than 3/10 with usual activity and cooking Baseline: Goal status: ongoing 12/01/23 Pt will be able to ambulate and stand at least 20 minutes without increased pain. Baseline: Goal status: ongoing 12/01/23  PLAN: PT FREQUENCY: 1-2 times per week   PT DURATION: 6 weeks  PLANNED INTERVENTIONS (unless contraindicated): aquatic PT, Canalith repositioning, cryotherapy, Electrical stimulation, Iontophoresis with 4 mg/ml dexamethasome, Moist heat, Ultrasound, gait training, Therapeutic exercise, balance training, neuromuscular re-education, patient/family education,, manual techniques, passive ROM, dry needling, taping, vasopnuematic device, vestibular, spinal manipulations, joint manipulations 97110-Therapeutic exercises, 97530- Therapeutic activity, 97112- Neuromuscular re-education, 97535- Self Care, and 16109- Manual therapy  PLAN FOR NEXT SESSION: had pain with bridges so held last time, transition to independent after next 3 visits.    April Manson, PT,DPT 12/20/2023, 10:23 AM

## 2023-12-22 ENCOUNTER — Encounter: Payer: Self-pay | Admitting: Physical Therapy

## 2023-12-22 ENCOUNTER — Ambulatory Visit: Payer: Medicare Other | Admitting: Physical Therapy

## 2023-12-22 DIAGNOSIS — M6281 Muscle weakness (generalized): Secondary | ICD-10-CM

## 2023-12-22 DIAGNOSIS — R293 Abnormal posture: Secondary | ICD-10-CM

## 2023-12-22 DIAGNOSIS — M5459 Other low back pain: Secondary | ICD-10-CM

## 2023-12-22 NOTE — Therapy (Signed)
 OUTPATIENT PHYSICAL THERAPY TREATMENT/Progress note Progress Note reporting period 11/09/23 to 12/22/23  See below for objective and subjective measurements relating to patients progress with PT.    Patient Name: [name] MRN: [id number] DOB:[birth year], 73 y.o., female Today's Date: 12/22/2023  END OF SESSION:  PT End of Session - 12/22/23 0952     Visit Number 10    Number of Visits 12    Date for PT Re-Evaluation 12/21/23    Authorization Type [employer] medicare $10 copay    Progress Note Due on Visit 10    PT Start Time 0930    PT Stop Time 1008    PT Time Calculation (min) 38 min    Activity Tolerance Patient tolerated treatment well    Behavior During Therapy WFL for tasks assessed/performed                 Past Medical History:  Diagnosis Date   Allergy    Anxiety    OCC   Arthritis    COPD (chronic obstructive pulmonary disease) (HCC)    Diabetes mellitus without complication (HCC)    no meds   GERD (gastroesophageal reflux disease)    OCC   Hereditary hemochromatosis (HCC)    Hyperlipidemia    Hypertension    Vitamin D deficiency 08/2017   Past Surgical History:  Procedure Laterality Date   COLONOSCOPY     10 + years ago   FRACTURE SURGERY  1985   tibia/ Fiblia fx surgery   GANGLION CYST EXCISION  2012   post surgery infection   I & D EXTREMITY Right 07/01/2013   Procedure: IRRIGATION AND DEBRIDEMENT RIGHT LEG;  Surgeon: Nadara Mustard, MD;  Location: [facility] OR;  Service: Orthopedics;  Laterality: Right;   LAPAROSCOPIC HYSTERECTOMY     ORIF FEMUR FRACTURE Right 07/01/2013   Procedure: OPEN REDUCTION INTERNAL FIXATION (ORIF) DISTAL FEMUR FRACTURE;  Surgeon: Nadara Mustard, MD;  Location: [facility] OR;  Service: Orthopedics;  Laterality: Right;   Patient Active Problem List   Diagnosis Date Noted   Changing skin lesion 11/24/2021   Hypertension associated with diabetes (HCC) 05/28/2021   Former smoker 05/28/2021   Dyslipidemia associated with type 2 diabetes  mellitus (HCC) 05/28/2021   COPD (chronic obstructive pulmonary disease) (HCC) 05/28/2021   Cataract 05/28/2021   T2DM (type 2 diabetes mellitus) (HCC) 05/28/2021   Depression, major, single episode, complete remission (HCC) 05/28/2021   Anxiety 05/28/2021   B12 deficiency 05/28/2021   Urge incontinence 05/28/2021   Hereditary hemochromatosis (HCC) 11/03/2017    PCP: Ardith Dark, MD   REFERRING PROVIDER: Patricia Nettle, MD   REFERRING DIAG: spinal stenosis, lumbar with neurogenic cladication [phone]   Rationale for Evaluation and Treatment: Rehabilitation  THERAPY DIAG:  Other low back pain  Muscle weakness (generalized)  Abnormal posture  ONSET DATE: 07/20/23 S/P L3-4 TLIF with instrumentation, L3-5 laminectomy, allograft    SUBJECTIVE:  SUBJECTIVE STATEMENT: Reports she feels good today with her back pain PERTINENT HISTORY:  See above PMH  PAIN:  NPRS scale: no pain upon arrival.  Pain location:low back Pain description: intermittent, ache, can be sharp Aggravating factors: standing prolonged housework Relieving factors: rest, meds   PRECAUTIONS: None  RED FLAGS: None   WEIGHT BEARING RESTRICTIONS: No  FALLS:  Has patient fallen in last 6 months? No   PLOF: Independent  PATIENT GOALS: to be able to walk longer distances and stand longer without pain, bend without pain  NEXT MD VISIT: April 2025  OBJECTIVE:  Note: Objective measures were completed at Evaluation unless otherwise noted.  DIAGNOSTIC FINDINGS:  XR 07/21/23 IMPRESSION:  Posterior rod and intrapedicular screw fusion L3-L4 without  complication.   PATIENT SURVEYS:  12/15/2023: 56%  11/09/2023 Eval: FOTO 48% functional, goal is 54%  COGNITION: 11/09/2023 Overall cognitive status: Within functional  limits for tasks assessed     SENSATION: 11/09/2023 [facility]  LUMBAR ROM:   AROM 11/09/2023 12/01/23  Flexion 90% 100%  Extension 50% 100%  Right lateral flexion 50% 100%  Left lateral flexion 50% 100%  Right rotation 50% 100%  Left rotation 50% 100%   (Blank rows = not tested)   LOWER EXTREMITY MMT:  11/09/2023 5/5 leg strength grossly bilat but some functional weakness observed for sit to stands, squats, stairs.  Core strength:  1/21/2025poor core strength overall with movement coordination impairments  LUMBAR SPECIAL TESTS:  11/09/2023 Slump test: Positive  FUNCTIONAL TESTS:  12/13/2023:  Cues needed to promote log rolling in supine to sit mobility.   12/01/23: 5 times sit to stand no UE support 9.6 seconds  11/09/2023 Eval: 5 times sit to stand: 15:84 seconds, needed bilat UE support    GAIT: 11/09/2023 Distance walked: limited community distances Assistive device utilized: None Level of assistance: Complete Independence Comments: decreased tolerance for full community distances                   TODAY'S TREATMENT:       DATE:  12/22/2023 Therex Nu step L5 X 10 min  Recumbent bike L5 X 8 min Supine SKC 15 sec x 3 bilateral Supine LTR 5 sec X 5 bilat Supine hooklying clam shell blue band 2X15 bilateral  theractivity Leg press DL 53# G64, then SL 40# [id number] each side Rows with green tband 2x15 Extension with green tband 2x15  Bent over low to mid row to simulate cranking lawnmower green 2X120  TODAY'S TREATMENT:       DATE:  12/20/2023 Therex Supine SKC 15 sec x 3 bilateral Supine LTR 5 sec X 10 bilat Supine hooklying clam shell blue band 2X15 bilateral  theractivity Leg press DL 25# Z56, then SL 38# [id number] each side Rows with green tband 2x15 Extension with green tband 2x15  Bent over low to mid row to simulate cranking lawnmower green 2X120     PATIENT EDUCATION: Education details: HEP, PT plan of care Person educated: Patient Education method: Explanation,  Demonstration, Verbal cues, and Handouts Education comprehension: verbalized understanding and needs further education   HOME EXERCISE PROGRAM: Access Code: [email or web address] URL: [email or web address] Date: 11/09/2023 Prepared by: Ivery Quale  Exercises - Supine Lower Trunk Rotation  - 2 x daily - 6 x weekly - 1 sets - 5 reps - 10 sec hold - Hooklying Single Knee to Chest Stretch  - 2 x daily - 6 x weekly - 1 sets - 2 reps - 20 sec hold - Slump  Stretch  - 2 x daily - 6 x weekly - 1 sets - 10 reps - Standing 'L' Stretch at Counter  - 2 x daily - 6 x weekly - 1 sets - 10 reps - 5 hold  ASSESSMENT:  CLINICAL IMPRESSION: She has made great overall progress with PT, she has met short term goals and 3/5 long term goals. She is on track to meet these and finish up with PT next week.   OBJECTIVE IMPAIRMENTS: decreased activity tolerance, difficulty walking, decreased balance, decreased endurance, decreased mobility, decreased ROM, decreased strength, impaired flexibility, impaired UE/LE use, postural dysfunction, and pain.  ACTIVITY LIMITATIONS: bending, lifting, carry, locomotion, cleaning, community activity,  PERSONAL FACTORS: see above PMH are also affecting patient's functional outcome.  REHAB POTENTIAL: Good  CLINICAL DECISION MAKING: Stable/uncomplicated  EVALUATION COMPLEXITY: Low    GOALS: Short term PT Goals Target date: 12/07/2023   Pt will be I and compliant with HEP. Baseline:  Goal status: MET 12/01/23 Pt will decrease pain by 25% overall Baseline: Goal status: met 12/01/23  Long term PT goals Target date:12/21/2023   Pt will improve lumbar AROM to [facility] to improve functional mobility Baseline: Goal status: MET 12/01/23 Pt will improve 5 times sit to stand to less than 13 seconds with UE support and be able to perform at least 5 reps without UE support regardless of time. Baseline: Goal status: MET 12/01/23 Pt will improve FOTO to at least 54% functional to  show improved function Baseline: Goal status: ongoing 12/01/23 Pt will reduce pain to overall less than 3/10 with usual activity and cooking Baseline: Goal status: ongoing 12/22/23 Pt will be able to ambulate and stand at least 20 minutes without increased pain. Baseline: Goal status: MET 12/22/23  PLAN: PT FREQUENCY: 1-2 times per week   PT DURATION: 6 weeks  PLANNED INTERVENTIONS (unless contraindicated): aquatic PT, Canalith repositioning, cryotherapy, Electrical stimulation, Iontophoresis with 4 mg/ml dexamethasome, Moist heat, Ultrasound, gait training, Therapeutic exercise, balance training, neuromuscular re-education, patient/family education,, manual techniques, passive ROM, dry needling, taping, vasopnuematic device, vestibular, spinal manipulations, joint manipulations 97110-Therapeutic exercises, 97530- Therapeutic activity, 97112- Neuromuscular re-education, 97535- Self Care, and 16109- Manual therapy  PLAN FOR NEXT SESSION: had pain with bridges so held last time, transition to independent after next 3 visits.    April Manson, PT,DPT 12/22/2023, 9:53 AM

## 2023-12-27 ENCOUNTER — Encounter: Payer: Self-pay | Admitting: Physical Therapy

## 2023-12-27 ENCOUNTER — Ambulatory Visit: Payer: Medicare Other | Admitting: Physical Therapy

## 2023-12-27 DIAGNOSIS — M6281 Muscle weakness (generalized): Secondary | ICD-10-CM

## 2023-12-27 DIAGNOSIS — R293 Abnormal posture: Secondary | ICD-10-CM

## 2023-12-27 DIAGNOSIS — M5459 Other low back pain: Secondary | ICD-10-CM | POA: Diagnosis not present

## 2023-12-27 NOTE — Therapy (Signed)
 OUTPATIENT PHYSICAL THERAPY TREATMENT   Patient Name: [name] MRN: [id number] DOB:1951/02/18, 73 y.o., female Today's Date: 12/27/2023  END OF SESSION:  PT End of Session - 12/27/23 0949     Visit Number 11    Number of Visits 12    Date for PT Re-Evaluation 12/21/23    Authorization Type BCBS medicare $10 copay    Progress Note Due on Visit 10    PT Start Time 0935    PT Stop Time 1015    PT Time Calculation (min) 40 min    Activity Tolerance Patient tolerated treatment well    Behavior During Therapy [facility] for tasks assessed/performed                 Past Medical History:  Diagnosis Date   Allergy    Anxiety    OCC   Arthritis    COPD (chronic obstructive pulmonary disease) (HCC)    Diabetes mellitus without complication (HCC)    no meds   GERD (gastroesophageal reflux disease)    OCC   Hereditary hemochromatosis (HCC)    Hyperlipidemia    Hypertension    Vitamin D deficiency 08/2017   Past Surgical History:  Procedure Laterality Date   COLONOSCOPY     10 + years ago   FRACTURE SURGERY  1985   tibia/ Fiblia fx surgery   GANGLION CYST EXCISION  2012   post surgery infection   I & D EXTREMITY Right 07/01/2013   Procedure: IRRIGATION AND DEBRIDEMENT RIGHT LEG;  Surgeon: Nadara Mustard, MD;  Location: [facility] OR;  Service: Orthopedics;  Laterality: Right;   LAPAROSCOPIC HYSTERECTOMY     ORIF FEMUR FRACTURE Right 07/01/2013   Procedure: OPEN REDUCTION INTERNAL FIXATION (ORIF) DISTAL FEMUR FRACTURE;  Surgeon: Nadara Mustard, MD;  Location: [facility] OR;  Service: Orthopedics;  Laterality: Right;   Patient Active Problem List   Diagnosis Date Noted   Changing skin lesion 11/24/2021   Hypertension associated with diabetes (HCC) 05/28/2021   Former smoker 05/28/2021   Dyslipidemia associated with type 2 diabetes mellitus (HCC) 05/28/2021   COPD (chronic obstructive pulmonary disease) (HCC) 05/28/2021   Cataract 05/28/2021   T2DM (type 2 diabetes mellitus) (HCC)  05/28/2021   Depression, major, single episode, complete remission (HCC) 05/28/2021   Anxiety 05/28/2021   B12 deficiency 05/28/2021   Urge incontinence 05/28/2021   Hereditary hemochromatosis (HCC) 11/03/2017    PCP: Ardith Dark, MD   REFERRING PROVIDER: Patricia Nettle, MD   REFERRING DIAG: spinal stenosis, lumbar with neurogenic cladication [phone]   Rationale for Evaluation and Treatment: Rehabilitation  THERAPY DIAG:  Other low back pain  Muscle weakness (generalized)  Abnormal posture  ONSET DATE: 07/20/23 S/P L3-4 TLIF with instrumentation, L3-5 laminectomy, allograft    SUBJECTIVE:  SUBJECTIVE STATEMENT: Reports doing good today, feels ready to finish up next visit PERTINENT HISTORY:  See above PMH  PAIN:  NPRS scale: no pain upon arrival.  Pain location:low back Pain description: intermittent, ache, can be sharp Aggravating factors: standing prolonged housework Relieving factors: rest, meds   PRECAUTIONS: None  RED FLAGS: None   WEIGHT BEARING RESTRICTIONS: No  FALLS:  Has patient fallen in last 6 months? No   PLOF: Independent  PATIENT GOALS: to be able to walk longer distances and stand longer without pain, bend without pain  NEXT MD VISIT: April 2025  OBJECTIVE:  Note: Objective measures were completed at Evaluation unless otherwise noted.  DIAGNOSTIC FINDINGS:  XR 07/21/23 IMPRESSION:  Posterior rod and intrapedicular screw fusion L3-L4 without  complication.   PATIENT SURVEYS:  12/15/2023: 56%  11/09/2023 Eval: FOTO 48% functional, goal is 54%  COGNITION: 11/09/2023 Overall cognitive status: Within functional limits for tasks assessed     SENSATION: 11/09/2023 [facility]  LUMBAR ROM:   AROM 11/09/2023 12/01/23  Flexion 90% 100%  Extension 50% 100%   Right lateral flexion 50% 100%  Left lateral flexion 50% 100%  Right rotation 50% 100%  Left rotation 50% 100%   (Blank rows = not tested)   LOWER EXTREMITY MMT:  11/09/2023 5/5 leg strength grossly bilat but some functional weakness observed for sit to stands, squats, stairs.  Core strength:  1/21/2025poor core strength overall with movement coordination impairments  LUMBAR SPECIAL TESTS:  11/09/2023 Slump test: Positive  FUNCTIONAL TESTS:  12/13/2023:  Cues needed to promote log rolling in supine to sit mobility.   12/01/23: 5 times sit to stand no UE support 9.6 seconds  11/09/2023 Eval: 5 times sit to stand: 15:84 seconds, needed bilat UE support    GAIT: 11/09/2023 Distance walked: limited community distances Assistive device utilized: None Level of assistance: Complete Independence Comments: decreased tolerance for full community distances                   TODAY'S TREATMENT:       DATE:  12/27/2023 Therex Nu step L5 X 10 min  Recumbent bike L5 X 8 min Supine SKC 15 sec x 3 bilateral Supine LTR 5 sec X 5 bilat Supine hooklying clam shell blue band 2X15 bilateral  theractivity Leg press DL 54# [id number], then SL 19# [id number] each side Rows with green tband 2x15 Extension with green tband 2x15  Bent over low to mid row to simulate cranking lawnmower green 2X120  TODAY'S TREATMENT:       DATE:  12/22/2023 Therex Nu step L5 X 10 min  Recumbent bike L5 X 8 min Supine SKC 15 sec x 3 bilateral Supine LTR 5 sec X 5 bilat Supine hooklying clam shell blue band 2X15 bilateral  theractivity Leg press DL 82# [id number], then SL 21# [id number] each side Rows with green tband 2x15 Extension with green tband 2x15  Bent over low to mid row to simulate cranking lawnmower green 2X120  TODAY'S TREATMENT:       DATE:  12/20/2023 Therex Supine SKC 15 sec x 3 bilateral Supine LTR 5 sec X 10 bilat Supine hooklying clam shell blue band 2X15 bilateral  theractivity Leg press DL 57# [id number], then SL 96#  2X52 each side Rows with green tband 2x15 Extension with green tband 2x15  Bent over low to mid row to simulate cranking lawnmower green 2X120     PATIENT EDUCATION: Education details: HEP, PT plan of care Person educated: Patient  Education method: Explanation, Demonstration, Verbal cues, and Handouts Education comprehension: verbalized understanding and needs further education   HOME EXERCISE PROGRAM: Access Code: [email or web address] URL: [email or web address] Date: 11/09/2023 Prepared by: Ivery Quale  Exercises - Supine Lower Trunk Rotation  - 2 x daily - 6 x weekly - 1 sets - 5 reps - 10 sec hold - Hooklying Single Knee to Chest Stretch  - 2 x daily - 6 x weekly - 1 sets - 2 reps - 20 sec hold - Slump Stretch  - 2 x daily - 6 x weekly - 1 sets - 10 reps - Standing 'L' Stretch at Counter  - 2 x daily - 6 x weekly - 1 sets - 10 reps - 5 hold  ASSESSMENT:  CLINICAL IMPRESSION: She is doing well so Plan to finish up with PT next visit and transition to independent program   OBJECTIVE IMPAIRMENTS: decreased activity tolerance, difficulty walking, decreased balance, decreased endurance, decreased mobility, decreased ROM, decreased strength, impaired flexibility, impaired UE/LE use, postural dysfunction, and pain.  ACTIVITY LIMITATIONS: bending, lifting, carry, locomotion, cleaning, community activity,  PERSONAL FACTORS: see above PMH are also affecting patient's functional outcome.  REHAB POTENTIAL: Good  CLINICAL DECISION MAKING: Stable/uncomplicated  EVALUATION COMPLEXITY: Low    GOALS: Short term PT Goals Target date: 12/07/2023   Pt will be I and compliant with HEP. Baseline:  Goal status: MET 12/01/23 Pt will decrease pain by 25% overall Baseline: Goal status: met 12/01/23  Long term PT goals Target date:12/21/2023   Pt will improve lumbar AROM to [facility] to improve functional mobility Baseline: Goal status: MET 12/01/23 Pt will improve 5 times sit to stand  to less than 13 seconds with UE support and be able to perform at least 5 reps without UE support regardless of time. Baseline: Goal status: MET 12/01/23 Pt will improve FOTO to at least 54% functional to show improved function Baseline: Goal status: ongoing 12/01/23 Pt will reduce pain to overall less than 3/10 with usual activity and cooking Baseline: Goal status: ongoing 12/22/23 Pt will be able to ambulate and stand at least 20 minutes without increased pain. Baseline: Goal status: MET 12/22/23  PLAN: PT FREQUENCY: 1-2 times per week   PT DURATION: 6 weeks  PLANNED INTERVENTIONS (unless contraindicated): aquatic PT, Canalith repositioning, cryotherapy, Electrical stimulation, Iontophoresis with 4 mg/ml dexamethasome, Moist heat, Ultrasound, gait training, Therapeutic exercise, balance training, neuromuscular re-education, patient/family education,, manual techniques, passive ROM, dry needling, taping, vasopnuematic device, vestibular, spinal manipulations, joint manipulations 97110-Therapeutic exercises, 97530- Therapeutic activity, [id number]- Neuromuscular re-education, 97535- Self Care, and [id number]- Manual therapy  PLAN FOR NEXT SESSION: DC   April Manson, PT,DPT 12/27/2023, 10:52 AM

## 2023-12-29 ENCOUNTER — Ambulatory Visit: Payer: Medicare Other | Admitting: Physical Therapy

## 2023-12-29 ENCOUNTER — Encounter: Payer: Self-pay | Admitting: Physical Therapy

## 2023-12-29 DIAGNOSIS — R293 Abnormal posture: Secondary | ICD-10-CM

## 2023-12-29 DIAGNOSIS — M6281 Muscle weakness (generalized): Secondary | ICD-10-CM | POA: Diagnosis not present

## 2023-12-29 DIAGNOSIS — M5459 Other low back pain: Secondary | ICD-10-CM

## 2023-12-29 NOTE — Therapy (Signed)
 OUTPATIENT PHYSICAL THERAPY TREATMENT/Discharge PHYSICAL THERAPY DISCHARGE SUMMARY  Visits from Start of Care: 12  Current functional level related to goals / functional outcomes: See below   Remaining deficits: See below   Education / Equipment: HEP  Plan:  Patient goals were  met. Patient is being discharged due to meeting PT treatment goals      Patient Name: [name] MRN: [id number] DOB:[birth year], 73 y.o., female Today's Date: 12/29/2023  END OF SESSION:  PT End of Session - 12/29/23 0938     Visit Number 12    Number of Visits 12    Date for PT Re-Evaluation 12/21/23    Authorization Type BCBS medicare $10 copay    Progress Note Due on Visit 10    PT Start Time 0930    PT Stop Time 1010    PT Time Calculation (min) 40 min    Activity Tolerance Patient tolerated treatment well    Behavior During Therapy [facility] for tasks assessed/performed                 Past Medical History:  Diagnosis Date   Allergy    Anxiety    OCC   Arthritis    COPD (chronic obstructive pulmonary disease) (HCC)    Diabetes mellitus without complication (HCC)    no meds   GERD (gastroesophageal reflux disease)    OCC   Hereditary hemochromatosis (HCC)    Hyperlipidemia    Hypertension    Vitamin D deficiency 08/2017   Past Surgical History:  Procedure Laterality Date   COLONOSCOPY     10 + years ago   FRACTURE SURGERY  1985   tibia/ Fiblia fx surgery   GANGLION CYST EXCISION  2012   post surgery infection   I & D EXTREMITY Right 07/01/2013   Procedure: IRRIGATION AND DEBRIDEMENT RIGHT LEG;  Surgeon: Nadara Mustard, MD;  Location: [facility] OR;  Service: Orthopedics;  Laterality: Right;   LAPAROSCOPIC HYSTERECTOMY     ORIF FEMUR FRACTURE Right 07/01/2013   Procedure: OPEN REDUCTION INTERNAL FIXATION (ORIF) DISTAL FEMUR FRACTURE;  Surgeon: Nadara Mustard, MD;  Location: [facility] OR;  Service: Orthopedics;  Laterality: Right;   Patient Active Problem List   Diagnosis Date Noted    Changing skin lesion 11/24/2021   Hypertension associated with diabetes (HCC) 05/28/2021   Former smoker 05/28/2021   Dyslipidemia associated with type 2 diabetes mellitus (HCC) 05/28/2021   COPD (chronic obstructive pulmonary disease) (HCC) 05/28/2021   Cataract 05/28/2021   T2DM (type 2 diabetes mellitus) (HCC) 05/28/2021   Depression, major, single episode, complete remission (HCC) 05/28/2021   Anxiety 05/28/2021   B12 deficiency 05/28/2021   Urge incontinence 05/28/2021   Hereditary hemochromatosis (HCC) 11/03/2017    PCP: Ardith Dark, MD   REFERRING PROVIDER: Patricia Nettle, MD   REFERRING DIAG: spinal stenosis, lumbar with neurogenic cladication [phone]   Rationale for Evaluation and Treatment: Rehabilitation  THERAPY DIAG:  Other low back pain  Muscle weakness (generalized)  Abnormal posture  ONSET DATE: 07/20/23 S/P L3-4 TLIF with instrumentation, L3-5 laminectomy, allograft    SUBJECTIVE:  SUBJECTIVE STATEMENT: Reports she feels ready to finish up with PT today PERTINENT HISTORY:  See above PMH  PAIN:  NPRS scale: no pain upon arrival.  Pain location:low back Pain description: intermittent, ache, can be sharp Aggravating factors: standing prolonged housework Relieving factors: rest, meds   PRECAUTIONS: None  RED FLAGS: None   WEIGHT BEARING RESTRICTIONS: No  FALLS:  Has patient fallen in last 6 months? No   PLOF: Independent  PATIENT GOALS: to be able to walk longer distances and stand longer without pain, bend without pain  NEXT MD VISIT: April 2025  OBJECTIVE:  Note: Objective measures were completed at Evaluation unless otherwise noted.  DIAGNOSTIC FINDINGS:  XR 07/21/23 IMPRESSION:  Posterior rod and intrapedicular screw fusion L3-L4 without   complication.   PATIENT SURVEYS:  12/15/2023: 56%  11/09/2023 Eval: FOTO 48% functional, goal is 54% 12/29/23: FOTO improved to 79%  COGNITION: 11/09/2023 Overall cognitive status: Within functional limits for tasks assessed     SENSATION: 11/09/2023 [facility]  LUMBAR ROM:   AROM 11/09/2023 12/01/23  Flexion 90% 100%  Extension 50% 100%  Right lateral flexion 50% 100%  Left lateral flexion 50% 100%  Right rotation 50% 100%  Left rotation 50% 100%   (Blank rows = not tested)   LOWER EXTREMITY MMT:  11/09/2023 5/5 leg strength grossly bilat but some functional weakness observed for sit to stands, squats, stairs.  Core strength:  1/21/2025poor core strength overall with movement coordination impairments  LUMBAR SPECIAL TESTS:  11/09/2023 Slump test: Positive  FUNCTIONAL TESTS:  12/13/2023:  Cues needed to promote log rolling in supine to sit mobility.   12/01/23: 5 times sit to stand no UE support 9.6 seconds  11/09/2023 Eval: 5 times sit to stand: 15:84 seconds, needed bilat UE support    GAIT: 11/09/2023 Distance walked: limited community distances Assistive device utilized: None Level of assistance: Complete Independence Comments: decreased tolerance for full community distances                   TODAY'S TREATMENT:       DATE:  12/29/2023 Therex Nu step L5 X 8 min  Supine SKC 15 sec x 3 bilateral Supine LTR 5 sec X 5 bilat Supine bridges X 15, holding 5 sec Supine hooklying clam shell blue band 2X15 bilateral  theractivity Leg press DL 56# [id number], then SL 86# V78 each side Rows with green tband 2x15 Extension with green tband 2x15  Bent over low to mid row to simulate cranking lawnmower green 2X120   PATIENT EDUCATION: Education details: HEP, PT plan of care Person educated: Patient Education method: Explanation, Demonstration, Verbal cues, and Handouts Education comprehension: verbalized understanding and needs further education   HOME EXERCISE  PROGRAM: Access Code: [email or web address] URL: [email or web address] Date: 11/09/2023 Prepared by: Ivery Quale  Exercises - Supine Lower Trunk Rotation  - 2 x daily - 6 x weekly - 1 sets - 5 reps - 10 sec hold - Hooklying Single Knee to Chest Stretch  - 2 x daily - 6 x weekly - 1 sets - 2 reps - 20 sec hold - Slump Stretch  - 2 x daily - 6 x weekly - 1 sets - 10 reps - Standing 'L' Stretch at Counter  - 2 x daily - 6 x weekly - 1 sets - 10 reps - 5 hold  ASSESSMENT:  CLINICAL IMPRESSION: She is doing well and has met PT goals. We will DC to transition to independent program.  OBJECTIVE IMPAIRMENTS: decreased  activity tolerance, difficulty walking, decreased balance, decreased endurance, decreased mobility, decreased ROM, decreased strength, impaired flexibility, impaired UE/LE use, postural dysfunction, and pain.  ACTIVITY LIMITATIONS: bending, lifting, carry, locomotion, cleaning, community activity,  PERSONAL FACTORS: see above PMH are also affecting patient's functional outcome.  REHAB POTENTIAL: Good  CLINICAL DECISION MAKING: Stable/uncomplicated  EVALUATION COMPLEXITY: Low    GOALS: Short term PT Goals Target date: 12/07/2023   Pt will be I and compliant with HEP. Baseline:  Goal status: MET 12/01/23 Pt will decrease pain by 25% overall Baseline: Goal status: met 12/01/23  Long term PT goals Target date:12/21/2023   Pt will improve lumbar AROM to [facility] to improve functional mobility Baseline: Goal status: MET 12/01/23 Pt will improve 5 times sit to stand to less than 13 seconds with UE support and be able to perform at least 5 reps without UE support regardless of time. Baseline: Goal status: MET 12/01/23 Pt will improve FOTO to at least 54% functional to show improved function Baseline: Goal status MET 12/29/23, improved to 79% Pt will reduce pain to overall less than 3/10 with usual activity and cooking Baseline: Goal status: MET 12/29/23 Pt will be able to  ambulate and stand at least 20 minutes without increased pain. Baseline: Goal status: MET 12/22/23  PLAN: PT FREQUENCY: 1-2 times per week   PT DURATION: 6 weeks  PLANNED INTERVENTIONS (unless contraindicated): aquatic PT, Canalith repositioning, cryotherapy, Electrical stimulation, Iontophoresis with 4 mg/ml dexamethasome, Moist heat, Ultrasound, gait training, Therapeutic exercise, balance training, neuromuscular re-education, patient/family education,, manual techniques, passive ROM, dry needling, taping, vasopnuematic device, vestibular, spinal manipulations, joint manipulations 97110-Therapeutic exercises, 97530- Therapeutic activity, [id number]- Neuromuscular re-education, 97535- Self Care, and 40981- Manual therapy  PLAN FOR NEXT SESSION: DC   April Manson, PT,DPT 12/29/2023, 10:08 AM

## 2024-01-09 ENCOUNTER — Other Ambulatory Visit: Payer: Self-pay | Admitting: Family Medicine

## 2024-01-14 ENCOUNTER — Inpatient Hospital Stay: Payer: Medicare Other | Attending: Hematology and Oncology

## 2024-01-14 DIAGNOSIS — K080 Exfoliation of teeth due to systemic causes: Secondary | ICD-10-CM | POA: Diagnosis not present

## 2024-01-14 NOTE — Patient Instructions (Signed)

## 2024-01-14 NOTE — Progress Notes (Signed)
 Per Pamelia Hoit, MD, okay to proceed with Phlebotomy today without new labs

## 2024-01-14 NOTE — Progress Notes (Signed)
 [name] presents today for phlebotomy per MD orders. Phlebotomy procedure started at 0849 and ended at 0853. 530 grams removed. Patient observed for 15 minutes after procedure without any incident. Patient tolerated procedure well. Snack and drink provided IV needle removed intact.

## 2024-01-18 ENCOUNTER — Other Ambulatory Visit: Payer: Self-pay | Admitting: Family Medicine

## 2024-01-20 ENCOUNTER — Encounter: Payer: Self-pay | Admitting: Acute Care

## 2024-01-27 DIAGNOSIS — M4326 Fusion of spine, lumbar region: Secondary | ICD-10-CM | POA: Diagnosis not present

## 2024-01-27 DIAGNOSIS — Z6829 Body mass index (BMI) 29.0-29.9, adult: Secondary | ICD-10-CM | POA: Diagnosis not present

## 2024-01-27 DIAGNOSIS — M48062 Spinal stenosis, lumbar region with neurogenic claudication: Secondary | ICD-10-CM | POA: Diagnosis not present

## 2024-02-08 DIAGNOSIS — N3941 Urge incontinence: Secondary | ICD-10-CM | POA: Diagnosis not present

## 2024-02-08 DIAGNOSIS — R350 Frequency of micturition: Secondary | ICD-10-CM | POA: Diagnosis not present

## 2024-02-20 ENCOUNTER — Other Ambulatory Visit: Payer: Self-pay

## 2024-02-20 ENCOUNTER — Encounter (HOSPITAL_BASED_OUTPATIENT_CLINIC_OR_DEPARTMENT_OTHER): Payer: Self-pay

## 2024-02-20 ENCOUNTER — Emergency Department (HOSPITAL_BASED_OUTPATIENT_CLINIC_OR_DEPARTMENT_OTHER)
Admission: EM | Admit: 2024-02-20 | Discharge: 2024-02-20 | Disposition: A | Discharge status: Home / Self Care | Attending: Emergency Medicine | Admitting: Emergency Medicine

## 2024-02-20 DIAGNOSIS — L309 Dermatitis, unspecified: Secondary | ICD-10-CM | POA: Diagnosis not present

## 2024-02-20 DIAGNOSIS — J449 Chronic obstructive pulmonary disease, unspecified: Secondary | ICD-10-CM | POA: Diagnosis not present

## 2024-02-20 DIAGNOSIS — E119 Type 2 diabetes mellitus without complications: Secondary | ICD-10-CM | POA: Insufficient documentation

## 2024-02-20 DIAGNOSIS — R21 Rash and other nonspecific skin eruption: Secondary | ICD-10-CM | POA: Diagnosis not present

## 2024-02-20 MED ORDER — METHYLPREDNISOLONE 4 MG PO TBPK: ORAL_TABLET | ORAL | 0 refills | Status: AC

## 2024-02-20 MED ORDER — METHYLPREDNISOLONE 4 MG PO TBPK: ORAL_TABLET | Freq: Every day | ORAL | 0 refills | Status: DC

## 2024-02-20 NOTE — Discharge Instructions (Signed)
 Please take the steroids as prescribed, follow-up with your primary care doctor, and use the acetone, or soapy water, to get the glue, off your fingers, and face.  Do not ingest the acetone though.  You can use mild hydrocortisone, to help, and have also sent steroids, for your diffuse allover body rash.  If you develop bleeding in your mouth, other mucosal lesions please return to the ER

## 2024-02-20 NOTE — ED Provider Notes (Signed)
 [place] EMERGENCY DEPARTMENT AT [facility] Provider Note   CSN: [id number] Arrival date & time: 02/20/24  1634     History  Chief Complaint  Patient presents with   Ingestion    glue    [name] is a 73 y.o. female, history of COPD, type 2 diabetes, who presents to the ED secondary to accidentally getting superglue on her fingers, and mouth.  She states she did not ingest it, she was repairing some furniture, and the glue was leaking, and she thought it was water, actually touched and then touched her face, and the glue got on her face.  She did not drink it though.  She denies any sticky taste in her mouth or bitter taste in her mouth.  She states she wants to get the glue off her face, she states she is fine to pick it off her fingers.  Also states that she got a rash, after doing some yard work, and cleaning up some [place] ivy in her yard.  Her husband has it and is on steroids and she states he is doing better.  She denies any blood in her mouth, or any other mucosal lesions.  Has not any fevers or chills.  She states the bumps are everywhere and is very itchy and red  Home Medications Prior to Admission medications   Medication Sig Start Date End Date Taking? Authorizing Provider  methylPREDNISolone (MEDROL DOSEPAK) 4 MG TBPK tablet Take 8 tablets (32 mg total) by mouth daily for 1 day, THEN 6 tablets (24 mg total) daily for 1 day, THEN 4 tablets (16 mg total) daily for 1 day, THEN 2 tablets (8 mg total) daily for 1 day, THEN 1 tablet (4 mg total) daily for 1 day. Taper over 6 days. 02/20/24 02/25/24 Yes Janaiya Beauchesne L, PA  albuterol  (VENTOLIN  HFA) 108 (90 Base) MCG/ACT inhaler 2 puffs as needed    [provider]  amLODipine  (NORVASC ) 10 MG tablet TAKE 1 TABLET BY MOUTH DAILY 05/19/23   Rodney Clamp, MD  B Complex Vitamins (B COMPLEX 1 PO) Take by mouth.    [provider]  cholecalciferol (VITAMIN D) 1000 units tablet Take 5,000 Units by mouth  daily.    [provider]  Homeopathic Products (LIVER SUPPORT) SUBL Place under the tongue.    [provider]  losartan  (COZAAR ) 100 MG tablet TAKE 1 TABLET BY MOUTH DAILY 10/12/23   Rodney Clamp, MD  MYRBETRIQ  25 MG TB24 tablet TAKE ONE TABLET BY MOUTH DAILY 08/18/22   Rodney Clamp, MD  pravastatin  (PRAVACHOL ) 40 MG tablet TAKE 1 TABLET BY MOUTH DAILY 09/29/23   Rodney Clamp, MD  pregabalin  (LYRICA ) 25 MG capsule Take 1 capsule (25 mg total) by mouth 2 (two) times daily. 05/31/23   Gudena, Vinay, MD  sertraline  (ZOLOFT ) 100 MG tablet TAKE ONE TABLET BY MOUTH DAILY 06/29/22   Rodney Clamp, MD  vitamin C (ASCORBIC ACID) 500 MG tablet Take 500 mg by mouth daily.    [provider]      Allergies    Patient has no known allergies.    Review of Systems   Review of Systems  Skin:  Positive for rash. Negative for wound.    Physical Exam Updated Vital Signs BP (!) 143/91 (BP Location: Right Arm)   Pulse 77   Temp 98.2 F (36.8 C)   Resp 18   SpO2 100%  Physical Exam Vitals and nursing note  reviewed.  Constitutional:      General: She is not in acute distress.    Appearance: She is well-developed.  HENT:     Head: Normocephalic and atraumatic.     Mouth/Throat:     Comments: Dried glue above mouth, no glue in oropharynx Eyes:     Conjunctiva/sclera: Conjunctivae normal.  Cardiovascular:     Rate and Rhythm: Normal rate and regular rhythm.     Heart sounds: No murmur heard. Pulmonary:     Effort: Pulmonary effort is normal. No respiratory distress.     Breath sounds: Normal breath sounds.  Abdominal:     Palpations: Abdomen is soft.     Tenderness: There is no abdominal tenderness.  Musculoskeletal:        General: No swelling.     Cervical back: Neck supple.  Skin:    General: Skin is warm and dry.     Capillary Refill: Capillary refill takes less than 2 seconds.     Comments: Erythematous, papular rash, along fingers, and arms.  Glue  on fingers  Neurological:     Mental Status: She is alert.  Psychiatric:        Mood and Affect: Mood normal.     ED Results / Procedures / Treatments   Labs (all labs ordered are listed, but only abnormal results are displayed) Labs Reviewed - No data to display  EKG None  Radiology No results found.  Procedures Procedures    Medications Ordered in ED Medications - No data to display  ED Course/ Medical Decision Making/ A&P                                 Medical Decision Making Patient is a 73 year old female, here for glue on her hands and above her mouth, that happened after she actually got on her hands, when she was working on a project.  She did not ingest any.  She has dry glue, above her mouth, I recommend using acetone, to remove the glue, oriole, over time, she did not ingest any, thus I did not talk to poison control regarding this.  I discussed she cannot put the acetone in her mouth, only on areas with it is on her surface of her skin, and to stop if she starts getting a rash, as the glue will come off on its own.  Additionally she would like me to look at her rash, she has papules, on her arms, legs, with some excoriation, she states she had this after cleaning out some IV.  This may represent some type of dermatitis, she notes that her husband has it as well, and is on steroids, and is helping tremendously.  We discussed her diabetes which she states she does not have and she does not take any insulin for, and she is okay with prednisone , sent to the pharmacy of her choice.  Return precautions emphasized, no blood in oral mucosa   Risk Prescription drug management.    Final Clinical Impression(s) / ED Diagnoses Final diagnoses:  Dermatitis    Rx / DC Orders ED Discharge Orders          Ordered    methylPREDNISolone (MEDROL DOSEPAK) 4 MG TBPK tablet  Daily,   Status:  Discontinued        02/20/24 1859    methylPREDNISolone (MEDROL DOSEPAK) 4 MG TBPK  tablet  Daily,   Status:  Discontinued  02/20/24 1859    methylPREDNISolone (MEDROL DOSEPAK) 4 MG TBPK tablet  Daily        02/20/24 1921              Timmy Forbes, PA 02/21/24 0000    Merdis Stalling, MD 02/21/24 [phone]

## 2024-02-20 NOTE — ED Triage Notes (Signed)
 Pt reports "dap all around the mouth, on my fingers, in the mouth," unsure if ingested.

## 2024-02-23 DIAGNOSIS — H52223 Regular astigmatism, bilateral: Secondary | ICD-10-CM | POA: Diagnosis not present

## 2024-02-23 DIAGNOSIS — H353131 Nonexudative age-related macular degeneration, bilateral, early dry stage: Secondary | ICD-10-CM | POA: Diagnosis not present

## 2024-02-23 DIAGNOSIS — H26493 Other secondary cataract, bilateral: Secondary | ICD-10-CM | POA: Diagnosis not present

## 2024-02-23 DIAGNOSIS — H524 Presbyopia: Secondary | ICD-10-CM | POA: Diagnosis not present

## 2024-03-11 DIAGNOSIS — R062 Wheezing: Secondary | ICD-10-CM | POA: Diagnosis not present

## 2024-03-11 DIAGNOSIS — J209 Acute bronchitis, unspecified: Secondary | ICD-10-CM | POA: Diagnosis not present

## 2024-03-11 DIAGNOSIS — Z8709 Personal history of other diseases of the respiratory system: Secondary | ICD-10-CM | POA: Diagnosis not present

## 2024-03-17 ENCOUNTER — Other Ambulatory Visit: Payer: Self-pay | Admitting: *Deleted

## 2024-03-17 ENCOUNTER — Inpatient Hospital Stay: Payer: Medicare Other | Attending: Hematology and Oncology

## 2024-03-17 ENCOUNTER — Inpatient Hospital Stay

## 2024-03-17 LAB — CBC WITH DIFFERENTIAL (CANCER CENTER ONLY)
Abs Immature Granulocytes: 0.03 10*3/uL (ref 0.00–0.07)
Basophils Absolute: 0 10*3/uL (ref 0.0–0.1)
Basophils Relative: 1 %
Eosinophils Absolute: 0.2 10*3/uL (ref 0.0–0.5)
Eosinophils Relative: 4 %
HCT: 43.5 % (ref 36.0–46.0)
Hemoglobin: 15.3 g/dL — ABNORMAL HIGH (ref 12.0–15.0)
Immature Granulocytes: 1 %
Lymphocytes Relative: 20 %
Lymphs Abs: 1.2 10*3/uL (ref 0.7–4.0)
MCH: 33.2 pg (ref 26.0–34.0)
MCHC: 35.2 g/dL (ref 30.0–36.0)
MCV: 94.4 fL (ref 80.0–100.0)
Monocytes Absolute: 0.4 10*3/uL (ref 0.1–1.0)
Monocytes Relative: 7 %
Neutro Abs: 3.9 10*3/uL (ref 1.7–7.7)
Neutrophils Relative %: 67 %
Platelet Count: 101 10*3/uL — ABNORMAL LOW (ref 150–400)
RBC: 4.61 MIL/uL (ref 3.87–5.11)
RDW: 13 % (ref 11.5–15.5)
WBC Count: 5.8 10*3/uL (ref 4.0–10.5)
nRBC: 0 % (ref 0.0–0.2)

## 2024-03-17 LAB — CMP (CANCER CENTER ONLY)
ALT: 54 U/L — ABNORMAL HIGH (ref 0–44)
AST: 42 U/L — ABNORMAL HIGH (ref 15–41)
Albumin: 4.3 g/dL (ref 3.5–5.0)
Alkaline Phosphatase: 97 U/L (ref 38–126)
Anion gap: 7 (ref 5–15)
BUN: 17 mg/dL (ref 8–23)
CO2: 32 mmol/L (ref 22–32)
Calcium: 9.1 mg/dL (ref 8.9–10.3)
Chloride: 98 mmol/L (ref 98–111)
Creatinine: 0.75 mg/dL (ref 0.44–1.00)
GFR, Estimated: >60 mL/min (ref >60)
Glucose, Bld: 223 mg/dL — ABNORMAL HIGH (ref 70–99)
Potassium: 4.5 mmol/L (ref 3.5–5.1)
Sodium: 137 mmol/L (ref 135–145)
Total Bilirubin: 0.7 mg/dL (ref 0.0–1.2)
Total Protein: 6.3 g/dL — ABNORMAL LOW (ref 6.5–8.1)

## 2024-03-17 LAB — IRON AND IRON BINDING CAPACITY (CC-WL,HP ONLY)
Iron: 88 ug/dL (ref 28–170)
Saturation Ratios: 27 % (ref 10.4–31.8)
TIBC: 328 ug/dL (ref 250–450)
UIBC: 240 ug/dL (ref 148–442)

## 2024-03-17 LAB — FERRITIN: Ferritin: 108 ng/mL (ref 11–307)

## 2024-03-17 NOTE — Progress Notes (Signed)
 Per MD okay to proceed with phlebotomy today with labs from 11/05/2023.  Verbal orders received and placed to obtain labs post phlebotomy.

## 2024-03-17 NOTE — Progress Notes (Signed)
 [name] presents today for phlebotomy per MD orders. Phlebotomy procedure started at 0847 and ended at 32. 513 cc removed. Patient tolerated procedure well. Snack provided.  IV needle removed intact. VSS. Pt discharged in stable condition to lobby.

## 2024-04-03 DIAGNOSIS — J438 Other emphysema: Secondary | ICD-10-CM | POA: Diagnosis not present

## 2024-04-03 DIAGNOSIS — M25561 Pain in right knee: Secondary | ICD-10-CM | POA: Diagnosis not present

## 2024-04-03 DIAGNOSIS — L989 Disorder of the skin and subcutaneous tissue, unspecified: Secondary | ICD-10-CM | POA: Diagnosis not present

## 2024-04-28 DIAGNOSIS — J019 Acute sinusitis, unspecified: Secondary | ICD-10-CM | POA: Diagnosis not present

## 2024-05-12 ENCOUNTER — Other Ambulatory Visit: Payer: Self-pay | Admitting: *Deleted

## 2024-05-15 ENCOUNTER — Inpatient Hospital Stay: Payer: Medicare Other

## 2024-05-15 ENCOUNTER — Inpatient Hospital Stay: Payer: Medicare Other | Attending: Hematology and Oncology

## 2024-05-15 LAB — CBC WITH DIFFERENTIAL (CANCER CENTER ONLY)
Abs Immature Granulocytes: 0.02 K/uL (ref 0.00–0.07)
Basophils Absolute: 0 K/uL (ref 0.0–0.1)
Basophils Relative: 1 %
Eosinophils Absolute: 0.2 K/uL (ref 0.0–0.5)
Eosinophils Relative: 5 %
HCT: 37.4 % (ref 36.0–46.0)
Hemoglobin: 13.4 g/dL (ref 12.0–15.0)
Immature Granulocytes: 1 %
Lymphocytes Relative: 21 %
Lymphs Abs: 0.8 K/uL (ref 0.7–4.0)
MCH: 33.5 pg (ref 26.0–34.0)
MCHC: 35.8 g/dL (ref 30.0–36.0)
MCV: 93.5 fL (ref 80.0–100.0)
Monocytes Absolute: 0.3 K/uL (ref 0.1–1.0)
Monocytes Relative: 7 %
Neutro Abs: 2.4 K/uL (ref 1.7–7.7)
Neutrophils Relative %: 65 %
Platelet Count: 78 K/uL — ABNORMAL LOW (ref 150–400)
RBC: 4 MIL/uL (ref 3.87–5.11)
RDW: 12.5 % (ref 11.5–15.5)
WBC Count: 3.7 K/uL — ABNORMAL LOW (ref 4.0–10.5)
nRBC: 0 % (ref 0.0–0.2)

## 2024-05-15 LAB — CMP (CANCER CENTER ONLY)
ALT: 47 U/L — ABNORMAL HIGH (ref 0–44)
AST: 37 U/L (ref 15–41)
Albumin: 4.3 g/dL (ref 3.5–5.0)
Alkaline Phosphatase: 95 U/L (ref 38–126)
Anion gap: 6 (ref 5–15)
BUN: 14 mg/dL (ref 8–23)
CO2: 29 mmol/L (ref 22–32)
Calcium: 8.7 mg/dL — ABNORMAL LOW (ref 8.9–10.3)
Chloride: 106 mmol/L (ref 98–111)
Creatinine: 0.6 mg/dL (ref 0.44–1.00)
GFR, Estimated: >60 mL/min (ref >60)
Glucose, Bld: 190 mg/dL — ABNORMAL HIGH (ref 70–99)
Potassium: 3.9 mmol/L (ref 3.5–5.1)
Sodium: 141 mmol/L (ref 135–145)
Total Bilirubin: 1 mg/dL (ref 0.0–1.2)
Total Protein: 6.4 g/dL — ABNORMAL LOW (ref 6.5–8.1)

## 2024-05-15 LAB — IRON AND IRON BINDING CAPACITY (CC-WL,HP ONLY)
Iron: 169 ug/dL (ref 28–170)
Saturation Ratios: 52 % — ABNORMAL HIGH (ref 10.4–31.8)
TIBC: 326 ug/dL (ref 250–450)
UIBC: 157 ug/dL (ref 148–442)

## 2024-05-15 LAB — FERRITIN: Ferritin: 96 ng/mL (ref 11–307)

## 2024-05-15 NOTE — Progress Notes (Signed)
 [name] presents today for phlebotomy per MD orders. Phlebotomy procedure started at 0933 w/ 16 g phlebotomy set and ended at 0937. 500 grams removed. Patient given food and drink and observed for 30 minutes after procedure without any incident. Patient tolerated procedure well. IV needle removed intact.

## 2024-05-15 NOTE — Patient Instructions (Signed)

## 2024-05-17 ENCOUNTER — Inpatient Hospital Stay (HOSPITAL_BASED_OUTPATIENT_CLINIC_OR_DEPARTMENT_OTHER): Payer: Medicare Other | Admitting: Hematology and Oncology

## 2024-05-17 NOTE — Assessment & Plan Note (Signed)
 Elevated liver function tests with elevated ferritin 08/2017 Ferritin : 918.9, iron saturation 43% 01/17/2018 ferritin 124, iron saturation 22% 04/05/2019: Ferritin 180, iron saturation 38% 01/23/2021: Ferritin 105, iron saturation 33%, AST 102, ALT 126, alkaline phosphatase 165, bilirubin 0.8 04/15/2021: Ferritin 43, iron saturation 13%, hemoglobin 15.3, platelets 118 AST 76, ALT 93, hemoglobin A1c 7.4 12/15/2021: Ferritin 29, iron saturation 10%, hemoglobin 14.3, platelets 92 08/14/2022: Ferritin 110, iron saturation 43%, hemoglobin 15.2, platelets 87, AST 45, ALT 54 02/01/2023: Hemoglobin 14.7, platelets 94, AST 32, ALT 39, iron saturation 34%, ferritin 85 05/03/2023: Hemoglobin 15.2, platelets 84, AST 37, ALT 40, iron saturation 43%, ferritin 131 11/05/2023: Hemoglobin 13.9, platelets 86, AST 32, ALT 35, iron saturation 24%, ferritin 51 05/15/2024: Hb 13.4, Platelets: 78, Iron sat: 52%, Ferritin 96, ALT 47   Diabetes: Her A1c has improved to 5.2 (from 8.2)  Weight issues: She lost 20 pounds by eating healthy. Laminectomy Oct 2024   Current phlebotomy schedule: Every 8 weeks  F/U in 4 months

## 2024-05-17 NOTE — Progress Notes (Signed)
 HEMATOLOGY-ONCOLOGY TELEPHONE VISIT PROGRESS NOTE  I connected with our patient on 05/17/24 at  9:15 AM EDT by telephone and verified that I am speaking with the correct person using two identifiers.  I discussed the limitations, risks, security and privacy concerns of performing an evaluation and management service by telephone and the availability of in person appointments.  I also discussed with the patient that there may be a patient responsible charge related to this service. The patient expressed understanding and agreed to proceed.   History of Present Illness: Follow-up to discuss lab results with history of hemochromatosis  History of Present Illness [name] is a 73 year old female who presents for routine follow-up of her blood work and phlebotomy results.  Her iron levels fluctuate between 80 and 130, with the most recent level at 96, within the target range of below 100. She continues with blood draws every two months. Her platelet count is currently 78, slightly lower than her usual range of 80s to 90s.      REVIEW OF SYSTEMS:   Constitutional: Denies fevers, chills or abnormal weight loss All other systems were reviewed with the patient and are negative. Observations/Objective:     Assessment Plan:  Hereditary hemochromatosis (HCC) Elevated liver function tests with elevated ferritin 08/2017 Ferritin : 918.9, iron saturation 43% 01/17/2018 ferritin 124, iron saturation 22% 04/05/2019: Ferritin 180, iron saturation 38% 01/23/2021: Ferritin 105, iron saturation 33%, AST 102, ALT 126, alkaline phosphatase 165, bilirubin 0.8 04/15/2021: Ferritin 43, iron saturation 13%, hemoglobin 15.3, platelets 118 AST 76, ALT 93, hemoglobin A1c 7.4 12/15/2021: Ferritin 29, iron saturation 10%, hemoglobin 14.3, platelets 92 08/14/2022: Ferritin 110, iron saturation 43%, hemoglobin 15.2, platelets 87, AST 45, ALT 54 02/01/2023: Hemoglobin 14.7, platelets 94, AST 32, ALT 39, iron saturation 34%,  ferritin 85 05/03/2023: Hemoglobin 15.2, platelets 84, AST 37, ALT 40, iron saturation 43%, ferritin 131 11/05/2023: Hemoglobin 13.9, platelets 86, AST 32, ALT 35, iron saturation 24%, ferritin 51 05/15/2024: Hb 13.4, Platelets: 78, Iron sat: 52%, Ferritin 96, ALT 47   Diabetes: Her A1c has improved to 5.2 (from 8.2)  Weight issues: She lost 20 pounds by eating healthy. Laminectomy Oct 2024   Current phlebotomy schedule: Every 8 weeks  F/U in 4 months  --------------------------------- Assessment and Plan Assessment & Plan Hereditary hemochromatosis Iron levels stable at 96. Platelet count at 78, slightly below usual but not concerning. Decreasing trend noted, no immediate bleeding risk unless below 20. - Continue phlebotomy every two months. - Monitor iron levels and platelet count with regular blood draws. - Schedule follow-up appointments every couple of blood draws to discuss results.      I discussed the assessment and treatment plan with the patient. The patient was provided an opportunity to ask questions and all were answered. The patient agreed with the plan and demonstrated an understanding of the instructions. The patient was advised to call back or seek an in-person evaluation if the symptoms worsen or if the condition fails to improve as anticipated.   I provided 20 minutes of non-face-to-face time during this encounter.  This includes time for charting and coordination of care   Sharon MARLA Chad, MD

## 2024-05-18 ENCOUNTER — Telehealth: Payer: Self-pay | Admitting: Hematology and Oncology

## 2024-05-18 NOTE — Telephone Encounter (Signed)
 Left patient a vm regarding upcoming appointment

## 2024-05-23 DIAGNOSIS — J189 Pneumonia, unspecified organism: Secondary | ICD-10-CM | POA: Diagnosis not present

## 2024-05-23 DIAGNOSIS — J019 Acute sinusitis, unspecified: Secondary | ICD-10-CM | POA: Diagnosis not present

## 2024-05-26 DIAGNOSIS — J189 Pneumonia, unspecified organism: Secondary | ICD-10-CM | POA: Diagnosis not present

## 2024-06-06 DIAGNOSIS — R42 Dizziness and giddiness: Secondary | ICD-10-CM | POA: Diagnosis not present

## 2024-06-06 DIAGNOSIS — H9313 Tinnitus, bilateral: Secondary | ICD-10-CM | POA: Diagnosis not present

## 2024-06-23 DIAGNOSIS — H903 Sensorineural hearing loss, bilateral: Secondary | ICD-10-CM | POA: Diagnosis not present

## 2024-06-28 NOTE — Telephone Encounter (Signed)
 Left message for patient to contact insurance company to see where they can get their hearing aids at an affordable cost to them.

## 2024-07-05 DIAGNOSIS — J310 Chronic rhinitis: Secondary | ICD-10-CM | POA: Diagnosis not present

## 2024-07-05 DIAGNOSIS — J441 Chronic obstructive pulmonary disease with (acute) exacerbation: Secondary | ICD-10-CM | POA: Diagnosis not present

## 2024-07-05 DIAGNOSIS — R053 Chronic cough: Secondary | ICD-10-CM | POA: Diagnosis not present

## 2024-07-18 ENCOUNTER — Inpatient Hospital Stay

## 2024-07-18 ENCOUNTER — Inpatient Hospital Stay: Attending: Hematology and Oncology

## 2024-07-18 LAB — CMP (CANCER CENTER ONLY)
ALT: 49 U/L — ABNORMAL HIGH (ref 0–44)
AST: 39 U/L (ref 15–41)
Albumin: 4.6 g/dL (ref 3.5–5.0)
Alkaline Phosphatase: 110 U/L (ref 38–126)
Anion gap: 8 (ref 5–15)
BUN: 21 mg/dL (ref 8–23)
CO2: 32 mmol/L (ref 22–32)
Calcium: 10 mg/dL (ref 8.9–10.3)
Chloride: 99 mmol/L (ref 98–111)
Creatinine: 0.73 mg/dL (ref 0.44–1.00)
GFR, Estimated: >60 mL/min (ref >60)
Glucose, Bld: 262 mg/dL — ABNORMAL HIGH (ref 70–99)
Potassium: 4.9 mmol/L (ref 3.5–5.1)
Sodium: 139 mmol/L (ref 135–145)
Total Bilirubin: 0.8 mg/dL (ref 0.0–1.2)
Total Protein: 6.5 g/dL (ref 6.5–8.1)

## 2024-07-18 LAB — FERRITIN: Ferritin: 87 ng/mL (ref 11–307)

## 2024-07-18 LAB — CBC WITH DIFFERENTIAL (CANCER CENTER ONLY)
Abs Immature Granulocytes: 0.06 K/uL (ref 0.00–0.07)
Basophils Absolute: 0 K/uL (ref 0.0–0.1)
Basophils Relative: 0 %
Eosinophils Absolute: 0 K/uL (ref 0.0–0.5)
Eosinophils Relative: 1 %
HCT: 44.2 % (ref 36.0–46.0)
Hemoglobin: 15.3 g/dL — ABNORMAL HIGH (ref 12.0–15.0)
Immature Granulocytes: 1 %
Lymphocytes Relative: 8 %
Lymphs Abs: 0.5 K/uL — ABNORMAL LOW (ref 0.7–4.0)
MCH: 32.6 pg (ref 26.0–34.0)
MCHC: 34.6 g/dL (ref 30.0–36.0)
MCV: 94.2 fL (ref 80.0–100.0)
Monocytes Absolute: 0.4 K/uL (ref 0.1–1.0)
Monocytes Relative: 5 %
Neutro Abs: 5.7 K/uL (ref 1.7–7.7)
Neutrophils Relative %: 85 %
Platelet Count: 86 K/uL — ABNORMAL LOW (ref 150–400)
RBC: 4.69 MIL/uL (ref 3.87–5.11)
RDW: 12.9 % (ref 11.5–15.5)
Smear Review: NORMAL
WBC Count: 6.7 K/uL (ref 4.0–10.5)
nRBC: 0 % (ref 0.0–0.2)

## 2024-07-18 LAB — IRON AND IRON BINDING CAPACITY (CC-WL,HP ONLY)
Iron: 114 ug/dL (ref 28–170)
Saturation Ratios: 31 % (ref 10.4–31.8)
TIBC: 363 ug/dL (ref 250–450)
UIBC: 249 ug/dL (ref 148–442)

## 2024-07-18 NOTE — Progress Notes (Signed)
 [name] presents today for phlebotomy per MD orders. Phlebotomy procedure started at 1511 and ended at 1516. 530 grams removed. Patient declined observation after procedure vss at discharge Patient tolerated procedure well. IV needle removed intact.

## 2024-07-18 NOTE — Patient Instructions (Signed)

## 2024-07-25 DIAGNOSIS — K080 Exfoliation of teeth due to systemic causes: Secondary | ICD-10-CM | POA: Diagnosis not present

## 2024-07-29 ENCOUNTER — Other Ambulatory Visit: Payer: Self-pay

## 2024-07-29 ENCOUNTER — Emergency Department (HOSPITAL_BASED_OUTPATIENT_CLINIC_OR_DEPARTMENT_OTHER)
Admission: EM | Admit: 2024-07-29 | Discharge: 2024-07-29 | Disposition: A | Discharge status: Home / Self Care | Attending: Emergency Medicine | Admitting: Emergency Medicine

## 2024-07-29 ENCOUNTER — Emergency Department (HOSPITAL_BASED_OUTPATIENT_CLINIC_OR_DEPARTMENT_OTHER)

## 2024-07-29 DIAGNOSIS — R29898 Other symptoms and signs involving the musculoskeletal system: Secondary | ICD-10-CM | POA: Diagnosis not present

## 2024-07-29 DIAGNOSIS — I10 Essential (primary) hypertension: Secondary | ICD-10-CM | POA: Insufficient documentation

## 2024-07-29 DIAGNOSIS — R42 Dizziness and giddiness: Secondary | ICD-10-CM | POA: Insufficient documentation

## 2024-07-29 DIAGNOSIS — G3189 Other specified degenerative diseases of nervous system: Secondary | ICD-10-CM | POA: Insufficient documentation

## 2024-07-29 DIAGNOSIS — I6381 Other cerebral infarction due to occlusion or stenosis of small artery: Secondary | ICD-10-CM | POA: Diagnosis not present

## 2024-07-29 DIAGNOSIS — R202 Paresthesia of skin: Secondary | ICD-10-CM | POA: Diagnosis not present

## 2024-07-29 DIAGNOSIS — I6523 Occlusion and stenosis of bilateral carotid arteries: Secondary | ICD-10-CM | POA: Diagnosis not present

## 2024-07-29 DIAGNOSIS — E1165 Type 2 diabetes mellitus with hyperglycemia: Secondary | ICD-10-CM | POA: Diagnosis not present

## 2024-07-29 DIAGNOSIS — H538 Other visual disturbances: Secondary | ICD-10-CM | POA: Insufficient documentation

## 2024-07-29 DIAGNOSIS — Z8673 Personal history of transient ischemic attack (TIA), and cerebral infarction without residual deficits: Secondary | ICD-10-CM | POA: Diagnosis not present

## 2024-07-29 DIAGNOSIS — R410 Disorientation, unspecified: Secondary | ICD-10-CM | POA: Insufficient documentation

## 2024-07-29 DIAGNOSIS — Z79899 Other long term (current) drug therapy: Secondary | ICD-10-CM | POA: Diagnosis not present

## 2024-07-29 DIAGNOSIS — J449 Chronic obstructive pulmonary disease, unspecified: Secondary | ICD-10-CM | POA: Diagnosis not present

## 2024-07-29 DIAGNOSIS — R739 Hyperglycemia, unspecified: Secondary | ICD-10-CM

## 2024-07-29 DIAGNOSIS — R29818 Other symptoms and signs involving the nervous system: Secondary | ICD-10-CM | POA: Diagnosis not present

## 2024-07-29 DIAGNOSIS — R531 Weakness: Secondary | ICD-10-CM | POA: Diagnosis not present

## 2024-07-29 LAB — DIFFERENTIAL
Abs Immature Granulocytes: 0.03 K/uL (ref 0.00–0.07)
Basophils Absolute: 0 K/uL (ref 0.0–0.1)
Basophils Relative: 0 %
Eosinophils Absolute: 0.2 K/uL (ref 0.0–0.5)
Eosinophils Relative: 4 %
Immature Granulocytes: 1 %
Lymphocytes Relative: 12 %
Lymphs Abs: 0.5 K/uL — ABNORMAL LOW (ref 0.7–4.0)
Monocytes Absolute: 0.3 K/uL (ref 0.1–1.0)
Monocytes Relative: 7 %
Neutro Abs: 3.1 K/uL (ref 1.7–7.7)
Neutrophils Relative %: 76 %

## 2024-07-29 LAB — CBG MONITORING, ED: Glucose-Capillary: 225 mg/dL — ABNORMAL HIGH (ref 70–99)

## 2024-07-29 LAB — COMPREHENSIVE METABOLIC PANEL WITH GFR
ALT: 65 U/L — ABNORMAL HIGH (ref 0–44)
AST: 61 U/L — ABNORMAL HIGH (ref 15–41)
Albumin: 4.7 g/dL (ref 3.5–5.0)
Alkaline Phosphatase: 130 U/L — ABNORMAL HIGH (ref 38–126)
Anion gap: 12 (ref 5–15)
BUN: 10 mg/dL (ref 8–23)
CO2: 28 mmol/L (ref 22–32)
Calcium: 9.8 mg/dL (ref 8.9–10.3)
Chloride: 100 mmol/L (ref 98–111)
Creatinine, Ser: 0.58 mg/dL (ref 0.44–1.00)
GFR, Estimated: >60 mL/min (ref >60)
Glucose, Bld: 244 mg/dL — ABNORMAL HIGH (ref 70–99)
Potassium: 3.9 mmol/L (ref 3.5–5.1)
Sodium: 140 mmol/L (ref 135–145)
Total Bilirubin: 1.2 mg/dL (ref 0.0–1.2)
Total Protein: 6.6 g/dL (ref 6.5–8.1)

## 2024-07-29 LAB — CBC
HCT: 34.8 % — ABNORMAL LOW (ref 36.0–46.0)
Hemoglobin: 12.1 g/dL (ref 12.0–15.0)
MCH: 32.9 pg (ref 26.0–34.0)
MCHC: 34.8 g/dL (ref 30.0–36.0)
MCV: 94.6 fL (ref 80.0–100.0)
Platelets: 81 K/uL — ABNORMAL LOW (ref 150–400)
RBC: 3.68 MIL/uL — ABNORMAL LOW (ref 3.87–5.11)
RDW: 14.2 % (ref 11.5–15.5)
WBC: 4 K/uL (ref 4.0–10.5)
nRBC: 0 % (ref 0.0–0.2)

## 2024-07-29 LAB — PROTIME-INR
INR: 1.1 (ref 0.8–1.2)
Prothrombin Time: 14.3 s (ref 11.4–15.2)

## 2024-07-29 LAB — ETHANOL: Alcohol, Ethyl (B): <15 mg/dL (ref <15)

## 2024-07-29 LAB — APTT: aPTT: 28 s (ref 24–36)

## 2024-07-29 MED ORDER — IOHEXOL 350 MG/ML SOLN
75.0000 mL | Freq: Once | INTRAVENOUS | Status: AC | PRN
  Administered 2024-07-29: 115 mL via INTRAVENOUS

## 2024-07-29 MED ORDER — ASPIRIN 325 MG PO TABS
325.0000 mg | ORAL_TABLET | Freq: Once | ORAL | Status: AC
  Administered 2024-07-29: 325 mg via ORAL
  Filled 2024-07-29: qty 1

## 2024-07-29 MED ORDER — SODIUM CHLORIDE 0.9% FLUSH
3.0000 mL | Freq: Once | INTRAVENOUS | Status: AC
  Administered 2024-07-29: 3 mL via INTRAVENOUS

## 2024-07-29 NOTE — Discharge Instructions (Signed)
 You were seen in the emergency room for new onset dizziness Your CAT scan and MRI did not show any evidence of a new stroke Your blood work was concerning for high liver function test and high blood sugar You will need to follow-up with your primary doctor to have blood work repeated We have provided you with a referral to see neurology in the office within the next 1 to 2 weeks to discuss your dizziness and see if there is a need for further testing Please use your walker to get around at home Return to the emergency department for severe dizziness, changes in speech, weakness on one side of your body or severe headaches or any other concerns

## 2024-07-29 NOTE — ED Triage Notes (Signed)
 Reports blurred vision dizziness and balance issues starting around 1500. Left arm weakness.   Activated [id number]

## 2024-07-29 NOTE — ED Notes (Signed)
 Patient to MRI.

## 2024-07-29 NOTE — Consult Note (Signed)
 [facility] Telemedicine Consult   Requesting Provider: Dr. Pamella  Chief Complaint: dizziness, confusion   HPI: 73 yo female with history of hypertension, hereditary hemochromatosis, lower back pain presented to ED for code stroke.  Per patient, around 3 PM she started to have episode of dizziness, lightheadedness, feeling her head was out of her body with wooziness.  It comes and goes for multiple rounds, each lasted about 60 seconds with nauseous but no vomiting.  She also felt to be confused, not able to read the time on the clock.  The ER glucose 225 and BP 149/63.  BP upon patient also has some left upper extremity weakness.  Code stroke activated.  However, on my exam, no weakness on the left upper extremity but patient did complaining of bilateral hand finger tingling sensation.  LKW: 3 PM TNK given?: No, nondisabling symptoms and thrombocytopenia IR Thrombectomy? No, no sign or symptoms of LVO Modified Rankin Scale: 0-Completely asymptomatic and back to baseline post- stroke   Exam: Vitals:   07/29/24 1629 07/29/24 1630  BP: (!) 149/63 (!) 149/63  Pulse: 72 72  Resp: 18 18  Temp: 98.1 F (36.7 C) 98.1 F (36.7 C)  SpO2: 95% 95%     Temp:  [98.1 F (36.7 C)] 98.1 F (36.7 C) (10/11 1630) Pulse Rate:  [72] 72 (10/11 1630) Resp:  [18] 18 (10/11 1630) BP: (149)/(63) 149/63 (10/11 1630) SpO2:  [95 %] 95 % (10/11 1630)  General - Well nourished, well developed, in no apparent distress.  Ophthalmologic - fundi not visualized due to noncooperation.  Cardiovascular - Regular rhythm and rate.  Neuro - awake, alert, eyes open, orientated to age, place, time. No aphasia, fluent language, following all simple commands. Able to name and repeat. No gaze palsy, tracking bilaterally, no nystagmus, no eyes disconjugate, no double vision reported, visual field full. No facial droop. Tongue midline. Bilateral UEs 5/5, no drift. Bilaterally LEs 5/5, no drift. Sensation  symmetrical bilaterally, b/l FTN and heel-to-shin intact, gait not tested.    NIH Stroke Scale = 0  Imaging Reviewed:  CT preliminary read no acute finding  CTA head neck pending  MRI pending   Labs reviewed in epic and pertinent values follow: Creatinine 0.73 on 07/18/2024, platelet 86 on 07/18/2024  Assessment:  73 yo female with history of hypertension, hereditary hemochromatosis, lower back pain presented to ED for episodes of dizziness, lightheadedness, and wooziness.  Also reported confusion and bilateral hand finger tingling sensation.  However, on my exam neuro intact.  NIH score 0.  Less than normal 3 PM.  CT no acute abnormality.  Patient not a TNK candidate given nondisabling symptoms and thrombocytopenia, not IR candidate given no signs or symptoms of LVO.  CTA neck currently pending.  Patient's symptoms likely peripheral cause or metabolic related, will do MRI to rule out stroke.  If workup negative, patient can be discharged from neurostandpoint with close neuro follow-up if feeling better and able to ambulate in the ED.   Recommendations:  Frequent neuro checks Telemetry monitoring MRI brain pending CTA head and neck pending Recommend aspirin  325 in the meantime Stroke risk factor modification Discussed with Dr. Pamella ED physician If all workup negative, patient can be discharged from neurostandpoint with close neuro follow-up if feeling better and able to ambulate in the ED. Will follow    Consult Participants: Dr. Pamella, RN, me, patient Location of the provider: [facility] Location of the patient: [facility] ED  Time Code Stroke Page received:  1633 Time neurologist arrived:  1635 Time NIHSS completed: 1652    This consult was provided via telemedicine with 2-way video and audio communication. The patient/family was informed that care would be provided in this way and agreed to receive care in this manner.   This patient is receiving care for possible acute neurological  changes. There was 50 minutes of care by this provider at the time of service, including time for direct evaluation via telemedicine, review of medical records, imaging studies and discussion of findings with providers, the patient and/or family.  Ary Cummins, MD PhD Stroke Neurology 07/29/2024 4:49 PM

## 2024-07-29 NOTE — ED Notes (Addendum)
 Back from CT scan

## 2024-07-29 NOTE — ED Notes (Signed)
 After neurologist assessment, RN transported to CT.

## 2024-07-29 NOTE — ED Notes (Signed)
 Ambulatory in hallway with some unsteadiness.  Dr. Pamella in to speak with patient to discuss admission vs discharge.  Patient has walker at home and states will use for steadiness

## 2024-07-29 NOTE — Plan of Care (Signed)
 Pt CT showed no acute finding. CTA head and neck showed left ICA proximal 55% stenosis, but likely asymptomatic.  MRI showed no acute abnormalities with ischemic white matter changes, not convinced with previous stroke as reported. She did have elevated glucose level and elevated liver enzymes. Pt symptoms still more likely peripheral dizziness vs. Metabolic related. If her symptoms much improved and able to ambulate in ED without difficulty, she may be discharged from ED with close neuro follow up as outpt. Otherwise, may consider admission/observation with correction of glucose / transaminitis and PT/OT evaluation.   Ary Cummins, MD PhD Stroke Neurology 07/29/2024 6:22 PM

## 2024-07-29 NOTE — ED Notes (Signed)
 Provider in triage before RN could take to CT. Provider doing assessment.

## 2024-07-29 NOTE — ED Notes (Signed)
 Neurologist performing his assessment at this time.  MD told to delay CT at this time.

## 2024-07-29 NOTE — ED Provider Notes (Signed)
 [place] [facility]ERGENCY DEPARTMENT AT [facility] Provider Note   CSN: [id number] Arrival date & time: 07/29/24  1620     Patient presents with: Dizziness   [name] is a 73 y.o. female.  With a history of hypertension, hyperlipidemia, type 2 diabetes and COPD presents to ED for dizziness.  Acute onset dizziness changes in vision difficulty reading at 1500 tonight.  Concern for left upper extremity weakness.  Episodes of dizziness and blurred vision have come and gone lasting for about 60 seconds each time since the time of onset.  No headaches nausea vomiting chest pain shortness of breath.  No changes in speech.  No prior history of similar episodes   HPI     Prior to Admission medications   Medication Sig Start Date End Date Taking? Authorizing Provider  albuterol  (VENTOLIN  HFA) 108 (90 Base) MCG/ACT inhaler 2 puffs as needed    [provider]  amLODipine  (NORVASC ) 10 MG tablet TAKE 1 TABLET BY MOUTH DAILY 05/19/23   Kennyth Worth HERO, MD  B Complex Vitamins (B COMPLEX 1 PO) Take by mouth.    [provider]  cholecalciferol (VITAMIN D) 1000 units tablet Take 5,000 Units by mouth daily.    [provider]  Homeopathic Products (LIVER SUPPORT) SUBL Place under the tongue.    [provider]  losartan  (COZAAR ) 100 MG tablet TAKE 1 TABLET BY MOUTH DAILY 10/12/23   Kennyth Worth HERO, MD  MYRBETRIQ  25 MG TB24 tablet TAKE ONE TABLET BY MOUTH DAILY 08/18/22   Kennyth Worth HERO, MD  pravastatin  (PRAVACHOL ) 40 MG tablet TAKE 1 TABLET BY MOUTH DAILY 09/29/23   Kennyth Worth HERO, MD  pregabalin  (LYRICA ) 25 MG capsule Take 1 capsule (25 mg total) by mouth 2 (two) times daily. 05/31/23   Gudena, Vinay, MD  sertraline  (ZOLOFT ) 100 MG tablet TAKE ONE TABLET BY MOUTH DAILY 06/29/22   Kennyth Worth HERO, MD  vitamin C (ASCORBIC ACID) 500 MG tablet Take 500 mg by mouth daily.    [provider]    Allergies: Patient has no known allergies.    Review of  Systems  Updated Vital Signs BP (!) 149/63 (BP Location: Right Arm)   Pulse 72   Temp 98.1 F (36.7 C) (Oral)   Resp 18   SpO2 95%   Physical Exam Vitals and nursing note reviewed.  HENT:     Head: Normocephalic and atraumatic.  Eyes:     Pupils: Pupils are equal, round, and reactive to light.  Cardiovascular:     Rate and Rhythm: Normal rate and regular rhythm.  Pulmonary:     Effort: Pulmonary effort is normal.     Breath sounds: Normal breath sounds.  Abdominal:     Palpations: Abdomen is soft.     Tenderness: There is no abdominal tenderness.  Skin:    General: Skin is warm and dry.  Neurological:     Mental Status: She is alert.     Comments: 4-5 grip strength in left hand 5 out of 5 grip strength right hand 5 out of 5 motor strength bilateral lower extremities No pronator drift of upper or lower extremities Sensation intact light touch throughout extremities No facial droop Clear fluent speech Vision grossly normal  Psychiatric:        Mood and Affect: Mood normal.     (all labs ordered are listed, but only abnormal results are displayed) Labs Reviewed  CBC - Abnormal; Notable for the following components:  Result Value   RBC 3.68 (*)    HCT 34.8 (*)    Platelets 81 (*)    All other components within normal limits  DIFFERENTIAL - Abnormal; Notable for the following components:   Lymphs Abs 0.5 (*)    All other components within normal limits  COMPREHENSIVE METABOLIC PANEL WITH GFR - Abnormal; Notable for the following components:   Glucose, Bld 244 (*)    AST 61 (*)    ALT 65 (*)    Alkaline Phosphatase 130 (*)    All other components within normal limits  CBG MONITORING, ED - Abnormal; Notable for the following components:   Glucose-Capillary 225 (*)    All other components within normal limits  PROTIME-INR  APTT  ETHANOL    EKG: None  Radiology: MR BRAIN WO CONTRAST Result Date: 07/29/2024 EXAM: MR Brain without Intravenous Contrast.  CLINICAL HISTORY: Neuro deficit, acute, stroke suspected. Reports blurred vision dizziness and balance issues starting around 1500. Left arm weakness. TECHNIQUE: Magnetic resonance images of the brain without intravenous contrast in multiple planes. CONTRAST: Without. COMPARISON: CT head without contrast, CT angio head and neck and CT perfusion 07/29/2024. FINDINGS: BRAIN: No restricted diffusion to indicate acute infarction. Remote looking or infarcts are present in the right basal ganglia. Periventricular T2 hyperintensities are mildly advanced for age. Remote ischemic changes are present in the thalami bilaterally and extend into the brainstem. No intracranial mass or hemorrhage. No midline shift or extra-axial fluid collection. No cerebellar tonsillar ectopia. The central arterial and venous flow voids are patent. VENTRICLES: No hydrocephalus. ORBITS: Bilateral lens replacements are noted. The globes and orbits are otherwise within normal limits. SINUSES AND MASTOIDS: The sinuses and mastoid air cells are clear. BONES: No acute fracture or focal osseous lesion. IMPRESSION: 1. No acute intracranial abnormality. 2. Remote infarcts involving the right basal ganglia and bilateral thalami with extension into the brainstem. 3. Periventricular T2 hyperintensities mildly advanced for age. Electronically signed by: Lonni Necessary MD 07/29/2024 05:43 PM EDT RP Workstation: [id number]   CT ANGIO HEAD NECK W WO CM W PERF (CODE STROKE) LKW > 6h Result Date: 07/29/2024 EXAM: CTA Head and Neck with Perfusion 07/29/2024 05:06:37 PM TECHNIQUE: CTA of the head and neck was performed with and without the administration of 115 mL of intravenous contrast (iohexol (OMNIPAQUE) 350 MG/ML injection 75 mL IOHEXOL 350 MG/ML SOLN). 3D postprocessing with multiplanar reconstructions and MIPs was performed to evaluate the vascular anatomy. Cerebral perfusion analysis using computed tomography with contrast administration, including  post-processing of parametric maps with determination of cerebral blood flow, cerebral blood volume, mean transit time and time-to-maximum. Automated exposure control, iterative reconstruction, and/or weight based adjustment of the mA/kV was utilized to reduce the radiation dose to as low as reasonably achievable. COMPARISON: None available CLINICAL HISTORY: Neuro deficit, acute, stroke suspected; LUE weakness, dizziness. FINDINGS: CTA NECK: AORTIC ARCH AND ARCH VESSELS: Atherosclerotic calcifications are present at the aortic arch and great vessel origins without focal stenosis or aneurysm. No dissection is present. No arterial injury. No significant stenosis of the brachiocephalic or subclavian arteries. CERVICAL CAROTID ARTERIES: Circumferential calcifications are present at the right carotid bifurcation and proximal right ICA. A minimal luminal diameter is 3.4 mm. No significant stenosis is present relative to the more distal vessels. Focal mural calcification is present in the mid left common carotid artery without significant stenosis. Irregular atherosclerotic calcifications are present at the left carotid bifurcation. The lumen is narrowed to 2 mm as compared to a more  normal vessel distally at 4.3 mm. The stenosis is calculated to be approximately 55% according to NASCET criteria. No dissection or arterial injury. CERVICAL VERTEBRAL ARTERIES: The left vertebral artery is a dominant vessel. No dissection, arterial injury, or significant stenosis. LUNGS AND MEDIASTINUM: Unremarkable. SOFT TISSUES: No acute abnormality. BONES: Straightening of normal cervical lordosis is present. Multilevel facet degenerative changes are greatest on the left at C3-C4 and C4-C5 and on the right at C5-C6 and C6-C7. CTA HEAD: ANTERIOR CIRCULATION: Atherosclerotic changes are present throughout the cavernous internal carotid arteries bilaterally without significant stenosis relative to the ICA termina. The right A1 segment is  aplastic. Both A2 segments filled from the left. No significant stenosis of the anterior cerebral arteries. No significant stenosis of the middle cerebral arteries. No aneurysm. POSTERIOR CIRCULATION: Fetal type right posterior cerebral artery is present. No significant stenosis of the posterior cerebral arteries. No significant stenosis of the basilar artery. No significant stenosis of the vertebral arteries. No aneurysm. OTHER: No dural venous sinus thrombosis on this non-dedicated study. CT PERFUSION: EXAM QUALITY: Exam quality is adequate with diagnostic perfusion maps. No significant motion artifact. Appropriate arterial inflow and venous outflow curves. CORE INFARCT (CBF<30% volume): 0 mL TOTAL HYPOPERFUSION (Tmax>6s volume): 0 mL PENUMBRA: Mismatch volume: 0 mL Mismatch ratio: not applicable Location: not applicable IMPRESSION: 1. No acute large vessel occlusion. 2. No hemodynamically significant stenosis or aneurysm in the head or neck vessels. 3. No evidence of ischemia by CT brain perfusion. 4. Approximately 55% stenosis of the left internal carotid artery origin by NASCET criteria. Electronically signed by: Lonni Necessary MD 07/29/2024 05:22 PM EDT RP Workstation: [id number]   CT HEAD CODE STROKE WO CONTRAST Result Date: 07/29/2024 EXAM: CT HEAD WITHOUT CONTRAST 07/29/2024 04:49:31 PM TECHNIQUE: CT of the head was performed without the administration of intravenous contrast. Automated exposure control, iterative reconstruction, and/or weight based adjustment of the mA/kV was utilized to reduce the radiation dose to as low as reasonably achievable. COMPARISON: None available. CLINICAL HISTORY: Neuro deficit, acute, stroke suspected. Acute onset of blurred vision, dizziness and abnormal balance beginning at 3 o'clock pm. Acute onset of left upper extremity weakness. FINDINGS: BRAIN AND VENTRICLES: No acute hemorrhage. No evidence of acute infarct. No hyperdense vessel is present. Atrophy and white  matter changes are mildly advanced for age. Atherosclerotic calcifications are present in the cavernous carotid arteries bilaterally. No hydrocephalus. No extra-axial collection. No mass effect or midline shift. [place] Stroke Program Early CT (ASPECT) Score: Ganglionic (caudate, ic, lentiform nucleus, insula, M1-M3): 7 Supraganglionic (M4-M6): 3 Total: 10 ORBITS: No acute abnormality. SINUSES: No acute abnormality. SOFT TISSUES AND SKULL: No acute soft tissue abnormality. No skull fracture. IMPRESSION: 1. No acute intracranial abnormality. ASPECTS 10/10 2. Mildly advanced atrophy and white matter changes for age. 3. Atherosclerotic calcifications in the cavernous carotid arteries bilaterally. No hyperdense vessel. The pertinent results were texted to Dr. Jerri via the [facility] system at 4:56pm. Electronically signed by: Lonni Necessary MD 07/29/2024 04:57 PM EDT RP Workstation: [id number]     Procedures   Medications Ordered in the ED  sodium chloride  flush (NS) 0.9 % injection 3 mL (3 mLs Intravenous Given 07/29/24 1658)  iohexol (OMNIPAQUE) 350 MG/ML injection 75 mL (115 mLs Intravenous Contrast Given 07/29/24 1705)  aspirin  tablet 325 mg (325 mg Oral Given 07/29/24 1754)    Clinical Course as of 07/29/24 1855  Sat Jul 29, 2024  1640 Dr Jerri (neurology) currently evaluating patient [MP]  1852 CT CTA showed no evidence of  acute ischemic changes or hemorrhage. [MP]  1852 MRI shows no acute intracranial normality may be remote infarcts of the right basal ganglia and bilateral thalamic.  Discussed these findings with Dr.Xu who has low suspicion for TIA at this time.  More likely peripheral dizziness or metabolic related with elevated liver enzymes and glucose level.  He feels if she can ambulate well in the ED and feels back to baseline she can be discharged with close neurology follow-up.  Patient does feel back to baseline.  She now has symmetric grip strength and is able to ambulate.  She does have a  cane and walker at home which I encouraged her to use.  Discussed the risks and benefits of admission versus discharge with close neurology follow-up at this time patient elects for discharge with close neuro follow-up which we will provide referral for. [MP]    Clinical Course User Index [MP] Pamella Ozell LABOR, DO                                 Medical Decision Making 73 year old female with history as above presented to the ED with acute onset dizziness blurred vision and difficulty reading.  Asymmetric grip strength in left hand.  No other focal neurologic deficit.  Activated as code stroke.  Dr.Xu has evaluated patient via teleneuro cart.  Recommends stroke workup with CT CTA head and neck and MRI.  We do have MRI available here.  Amount and/or Complexity of Data Reviewed Labs: ordered. Radiology: ordered.  Risk Prescription drug management.        Final diagnoses:  Dizziness  Hyperglycemia    ED Discharge Orders          Ordered    Ambulatory referral to Neurology       Comments: An appointment is requested in approximately: 1 week   07/29/24 1854               Pamella Ozell LABOR, DO 07/29/24 1855

## 2024-07-31 ENCOUNTER — Encounter: Payer: Self-pay | Admitting: Neurology

## 2024-08-02 ENCOUNTER — Encounter: Payer: Self-pay | Admitting: Hematology and Oncology

## 2024-08-02 ENCOUNTER — Other Ambulatory Visit (HOSPITAL_COMMUNITY): Payer: Self-pay

## 2024-08-02 DIAGNOSIS — J45909 Unspecified asthma, uncomplicated: Secondary | ICD-10-CM | POA: Diagnosis not present

## 2024-08-02 DIAGNOSIS — R051 Acute cough: Secondary | ICD-10-CM | POA: Diagnosis not present

## 2024-08-02 DIAGNOSIS — R3915 Urgency of urination: Secondary | ICD-10-CM | POA: Diagnosis not present

## 2024-08-02 DIAGNOSIS — N3281 Overactive bladder: Secondary | ICD-10-CM | POA: Diagnosis not present

## 2024-08-02 DIAGNOSIS — R350 Frequency of micturition: Secondary | ICD-10-CM | POA: Diagnosis not present

## 2024-08-02 DIAGNOSIS — Z03818 Encounter for observation for suspected exposure to other biological agents ruled out: Secondary | ICD-10-CM | POA: Diagnosis not present

## 2024-08-02 MED ORDER — SULFAMETHOXAZOLE-TRIMETHOPRIM 800-160 MG PO TABS
1.0000 | ORAL_TABLET | Freq: Two times a day (BID) | ORAL | 0 refills | Status: AC
  Filled 2024-08-02: qty 6, 3d supply, fill #0

## 2024-08-10 DIAGNOSIS — J449 Chronic obstructive pulmonary disease, unspecified: Secondary | ICD-10-CM | POA: Diagnosis not present

## 2024-08-10 DIAGNOSIS — J309 Allergic rhinitis, unspecified: Secondary | ICD-10-CM | POA: Diagnosis not present

## 2024-08-10 DIAGNOSIS — Z23 Encounter for immunization: Secondary | ICD-10-CM | POA: Diagnosis not present

## 2024-08-10 DIAGNOSIS — M810 Age-related osteoporosis without current pathological fracture: Secondary | ICD-10-CM | POA: Diagnosis not present

## 2024-08-10 DIAGNOSIS — Z1231 Encounter for screening mammogram for malignant neoplasm of breast: Secondary | ICD-10-CM | POA: Diagnosis not present

## 2024-08-14 ENCOUNTER — Other Ambulatory Visit (HOSPITAL_COMMUNITY): Payer: Self-pay

## 2024-08-15 DIAGNOSIS — Z1231 Encounter for screening mammogram for malignant neoplasm of breast: Secondary | ICD-10-CM | POA: Diagnosis not present

## 2024-08-16 DIAGNOSIS — M545 Low back pain, unspecified: Secondary | ICD-10-CM | POA: Diagnosis not present

## 2024-08-16 DIAGNOSIS — Z1330 Encounter for screening examination for mental health and behavioral disorders, unspecified: Secondary | ICD-10-CM | POA: Diagnosis not present

## 2024-08-16 DIAGNOSIS — M4326 Fusion of spine, lumbar region: Secondary | ICD-10-CM | POA: Diagnosis not present

## 2024-08-29 DIAGNOSIS — K080 Exfoliation of teeth due to systemic causes: Secondary | ICD-10-CM | POA: Diagnosis not present

## 2024-08-29 NOTE — Progress Notes (Signed)
 New Patient Pulmonology Office Visit   Subjective:  Patient ID: [name], female    DOB: January 24, 1951  MRN: [id number]  Referred by: Kip Righter, MD  CC: No chief complaint on file.  Discussed the use of AI scribe software for clinical note transcription with the patient, who gave verbal consent to proceed.  History of Present Illness [name] is a 73 year old female with mild COPD who presents with worsening cough and wheezing.  She experiences a persistent and worsening cough with clear mucus production. Previous episodes of bronchitis and COVID-19 have not resolved the cough. Prednisone  improved other symptoms but not the cough, and Tessalon  Perles have been ineffective.  She has mild COPD diagnosed three years ago and uses Anoro and an albuterol  inhaler for rescue, which she uses rarely. Severe coughing attacks led to discontinuation of Trelegy. Wheezing and shortness of breath occur, especially with exertion, and have worsened over time.  She quit smoking in 2014 after a history of smoking a pack a day. A CT lung cancer screening in 2024 noted a right paraspinal mass. She underwent back surgery last year for a different issue.   ROS as above   Allergies: Patient has no known allergies.  Current Outpatient Medications:    albuterol  (VENTOLIN  HFA) 108 (90 Base) MCG/ACT inhaler, 2 puffs as needed, Disp: , Rfl:    amLODipine  (NORVASC ) 10 MG tablet, TAKE 1 TABLET BY MOUTH DAILY, Disp: 90 tablet, Rfl: 1   B Complex Vitamins (B COMPLEX 1 PO), Take by mouth., Disp: , Rfl:    cholecalciferol (VITAMIN D) 1000 units tablet, Take 5,000 Units by mouth daily., Disp: , Rfl:    Homeopathic Products (LIVER SUPPORT) SUBL, Place under the tongue., Disp: , Rfl:    losartan  (COZAAR ) 100 MG tablet, TAKE 1 TABLET BY MOUTH DAILY, Disp: 90 tablet, Rfl: 3   MYRBETRIQ  25 MG TB24 tablet, TAKE ONE TABLET BY MOUTH DAILY, Disp: 30 tablet, Rfl: 5   pravastatin  (PRAVACHOL ) 40 MG tablet, TAKE 1 TABLET  BY MOUTH DAILY, Disp: 90 tablet, Rfl: 0   pregabalin  (LYRICA ) 25 MG capsule, Take 1 capsule (25 mg total) by mouth 2 (two) times daily., Disp: , Rfl:    sertraline  (ZOLOFT ) 100 MG tablet, TAKE ONE TABLET BY MOUTH DAILY, Disp: 90 tablet, Rfl: 3   sulfamethoxazole-trimethoprim (BACTRIM DS) 800-160 MG tablet, Take 1 tablet by mouth in the morning and at bedtime on the day before, day of and day after procedure., Disp: 6 tablet, Rfl: 0   vitamin C (ASCORBIC ACID) 500 MG tablet, Take 500 mg by mouth daily., Disp: , Rfl:  Past Medical History:  Diagnosis Date   Allergy    Anxiety    OCC   Arthritis    COPD (chronic obstructive pulmonary disease) (HCC)    Diabetes mellitus without complication (HCC)    no meds   GERD (gastroesophageal reflux disease)    OCC   Hereditary hemochromatosis    Hyperlipidemia    Hypertension    Vitamin D deficiency 08/2017   Past Surgical History:  Procedure Laterality Date   COLONOSCOPY     10 + years ago   FRACTURE SURGERY  1985   tibia/ Fiblia fx surgery   GANGLION CYST EXCISION  2012   post surgery infection   I & D EXTREMITY Right 07/01/2013   Procedure: IRRIGATION AND DEBRIDEMENT RIGHT LEG;  Surgeon: Jerona LULLA Sage, MD;  Location: [facility] OR;  Service: Orthopedics;  Laterality: Right;  LAPAROSCOPIC HYSTERECTOMY     ORIF FEMUR FRACTURE Right 07/01/2013   Procedure: OPEN REDUCTION INTERNAL FIXATION (ORIF) DISTAL FEMUR FRACTURE;  Surgeon: Jerona LULLA Sage, MD;  Location: [facility] OR;  Service: Orthopedics;  Laterality: Right;   Family History  Problem Relation Age of Onset   Breast cancer Neg Hx    Colon polyps Neg Hx    Colon cancer Neg Hx    Esophageal cancer Neg Hx    Rectal cancer Neg Hx    Stomach cancer Neg Hx    Social History   Socioeconomic History   Marital status: Married    Spouse name: Not on file   Number of children: Not on file   Years of education: Not on file   Highest education level: Not on file  Occupational History   Not on file   Tobacco Use   Smoking status: Former    Current packs/day: 1.00    Average packs/day: 1 pack/day for 44.0 years (44.0 ttl pk-yrs)    Types: Cigarettes   Smokeless tobacco: Never  Substance and Sexual Activity   Alcohol use: Yes    Alcohol/week: 2.0 standard drinks of alcohol    Types: 2 Glasses of wine per week   Drug use: Never   Sexual activity: Not on file  Other Topics Concern   Not on file  Social History Narrative   Not on file   Social Drivers of Health   Financial Resource Strain: Low Risk  (08/16/2024)   Received from [facility]   Overall Financial Resource Strain (CARDIA)    How hard is it for you to pay for the very basics like food, housing, medical care, and heating?: Not hard at all  Food Insecurity: No Food Insecurity (08/16/2024)   Received from [facility]   Hunger Vital Sign    Within the past 12 months, you worried that your food would run out before you got the money to buy more.: Never true    Within the past 12 months, the food you bought just didn't last and you didn't have money to get more.: Never true  Transportation Needs: No Transportation Needs (08/16/2024)   Received from [facility] - Transportation    In the past 12 months, has lack of transportation kept you from medical appointments or from getting medications?: No    In the past 12 months, has lack of transportation kept you from meetings, work, or from getting things needed for daily living?: No  Physical Activity: Insufficiently Active (01/27/2023)   Received from [facility]   Exercise Vital Sign    On average, how many days per week do you engage in moderate to strenuous exercise (like a brisk walk)?: 2 days    On average, how many minutes do you engage in exercise at this level?: 60 min  Stress: Stress Concern Present (01/27/2023)   Received from [facility] - Occupational Stress Questionnaire    Feeling of Stress : To some  extent  Social Connections: Moderately Integrated (01/27/2023)   Received from [facility]   Social Network    How would you rate your social network (family, work, friends)?: Adequate participation with social networks  Intimate Partner Violence: Not At Risk (01/27/2023)   Received from [facility]   HITS    Over the last 12 months how often did your partner physically hurt you?: Never    Over the last 12 months how often  did your partner insult you or talk down to you?: Rarely    Over the last 12 months how often did your partner threaten you with physical harm?: Never    Over the last 12 months how often did your partner scream or curse at you?: Rarely       Objective:  There were no vitals taken for this visit.  Wt Readings from Last 3 Encounters:  08/30/24 151 lb 9.6 oz (68.8 kg)  05/31/23 151 lb 1.6 oz (68.5 kg)  08/17/22 145 lb 14.4 oz (66.2 kg)   BMI Readings from Last 3 Encounters:  08/30/24 28.64 kg/m  05/31/23 28.55 kg/m  08/17/22 27.57 kg/m   SpO2 Readings from Last 3 Encounters:  08/30/24 94%  07/29/24 97%  07/18/24 96%   Physical Exam NO in acute distress Normocephalic PEERL RRR, no murmur  Lung sounds normal, no resp distress, no wheezing  Move 4 extremities AOX3  IMAGES:   CT chest w contrast 04/08/23 - only reading, no images on the system  1. A 3.8 x 2.6 x 1.6 cm right paraspinal mass with apparent mild enhancement. Differential considerations are relatively broad and include benign lesion to include benign solitary tumorous, nerve sheath tumors, less likely mesothelioma or extra medullary hematopoiesis. A contrasted MRI of the thoracic spine should be considered for further evaluation.  2. Somewhat ill-defined sclerotic lesion involving the posterior T7 vertebral body and extending into the left posterior elements. This is also indeterminate though could represent a slightly atypical hemangioma. MRI would also be useful for evaluation of this  lesion.  3. Hepatosplenomegaly with questionable early cirrhotic changes.  Lungs are normal.   Assessment & Plan:   Assessment & Plan Chronic obstructive pulmonary disease (COPD) with chronic cough and sputum production  Mild COPD not well controlled with persistent wheezing and chronic cough. Recent exacerbations include bronchitis and COVID-19. Current treatment with Anoro ineffective. Previous adverse reaction to Trelegy. Smoking cessation achieved in 2014. Previous prednisone  courses improved other symptoms but not the cough. Cough medications ineffective.  - Ordered pulmonary function test to assess current COPD status. - Prescribed Breztri inhaler, two puffs in the morning and two puffs at night, with instructions to rinse mouth after use to prevent thrush. - Prescribed Tusinex for 10 days to manage severe cough, with caution regarding potential drowsiness due to hydrocodone  content. - Continue albuterol  as needed for acute symptoms. - Encouraged regular exercise to help manage mucus production. - Referral for lung cancer screening     Return in about 3 months (around 11/30/2024).    Marny Patch, MD Pulmonary and Critical Care Medicine [facility]

## 2024-08-30 ENCOUNTER — Ambulatory Visit

## 2024-08-30 VITALS — BP 132/82 | HR 92 | Temp 97.8°F | Ht 61.0 in | Wt 151.6 lb

## 2024-08-30 DIAGNOSIS — R053 Chronic cough: Secondary | ICD-10-CM

## 2024-08-30 DIAGNOSIS — J449 Chronic obstructive pulmonary disease, unspecified: Secondary | ICD-10-CM | POA: Diagnosis not present

## 2024-08-30 DIAGNOSIS — J418 Mixed simple and mucopurulent chronic bronchitis: Secondary | ICD-10-CM

## 2024-08-30 MED ORDER — BREZTRI AEROSPHERE 160-9-4.8 MCG/ACT IN AERO: 2.0000 | INHALATION_SPRAY | Freq: Two times a day (BID) | RESPIRATORY_TRACT | 6 refills | Status: AC

## 2024-08-30 MED ORDER — HYDROCOD POLI-CHLORPHE POLI ER 10-8 MG/5ML PO SUER: 5.0000 mL | Freq: Two times a day (BID) | ORAL | 0 refills | Status: AC | PRN

## 2024-08-30 NOTE — Patient Instructions (Addendum)
 Dear [name];   Given your history of COPD, and 2 times requiring prednisone , I will increase your therapy to Breztri ( 3 medications for COPD), 2 puff twice a day.   For your cough I will try Tussionex 5 ml twice a day for 10 days.   Continue to exercise it will help to mobilize the secretions.  I will repeat a pulmonary function test.  I sent a referral for lung cancer screening program.

## 2024-08-30 NOTE — Addendum Note (Signed)
 Addended by: SANCHEZ FERNANDEZ, Jasmia Angst L on: 08/30/2024 06:45 PM   Modules accepted: Orders

## 2024-09-04 ENCOUNTER — Encounter: Payer: Self-pay | Admitting: Hematology and Oncology

## 2024-09-04 ENCOUNTER — Other Ambulatory Visit (HOSPITAL_COMMUNITY): Payer: Self-pay

## 2024-09-04 MED ORDER — SULFAMETHOXAZOLE-TRIMETHOPRIM 800-160 MG PO TABS
1.0000 | ORAL_TABLET | Freq: Two times a day (BID) | ORAL | 0 refills | Status: DC
  Filled 2024-09-04: qty 6, 3d supply, fill #0

## 2024-09-06 ENCOUNTER — Other Ambulatory Visit: Payer: Self-pay

## 2024-09-06 ENCOUNTER — Encounter: Payer: Self-pay | Admitting: Hematology and Oncology

## 2024-09-06 DIAGNOSIS — Z122 Encounter for screening for malignant neoplasm of respiratory organs: Secondary | ICD-10-CM

## 2024-09-06 DIAGNOSIS — Z87891 Personal history of nicotine dependence: Secondary | ICD-10-CM

## 2024-09-13 ENCOUNTER — Inpatient Hospital Stay

## 2024-09-13 ENCOUNTER — Inpatient Hospital Stay: Attending: Hematology and Oncology

## 2024-09-13 LAB — IRON AND IRON BINDING CAPACITY (CC-WL,HP ONLY)
Iron: 62 ug/dL (ref 28–170)
Saturation Ratios: 15 % (ref 10.4–31.8)
TIBC: 414 ug/dL (ref 250–450)
UIBC: 353 ug/dL

## 2024-09-13 LAB — CBC WITH DIFFERENTIAL (CANCER CENTER ONLY)
Abs Immature Granulocytes: 0.04 K/uL (ref 0.00–0.07)
Basophils Absolute: 0 K/uL (ref 0.0–0.1)
Basophils Relative: 1 %
Eosinophils Absolute: 0.1 K/uL (ref 0.0–0.5)
Eosinophils Relative: 3 %
HCT: 40.7 % (ref 36.0–46.0)
Hemoglobin: 14.3 g/dL (ref 12.0–15.0)
Immature Granulocytes: 1 %
Lymphocytes Relative: 23 %
Lymphs Abs: 1 K/uL (ref 0.7–4.0)
MCH: 32.9 pg (ref 26.0–34.0)
MCHC: 35.1 g/dL (ref 30.0–36.0)
MCV: 93.8 fL (ref 80.0–100.0)
Monocytes Absolute: 0.3 K/uL (ref 0.1–1.0)
Monocytes Relative: 7 %
Neutro Abs: 2.7 K/uL (ref 1.7–7.7)
Neutrophils Relative %: 65 %
Platelet Count: 90 K/uL — ABNORMAL LOW (ref 150–400)
RBC: 4.34 MIL/uL (ref 3.87–5.11)
RDW: 13.4 % (ref 11.5–15.5)
WBC Count: 4.1 K/uL (ref 4.0–10.5)
nRBC: 0 % (ref 0.0–0.2)

## 2024-09-13 LAB — CMP (CANCER CENTER ONLY)
ALT: 54 U/L — ABNORMAL HIGH (ref 0–44)
AST: 53 U/L — ABNORMAL HIGH (ref 15–41)
Albumin: 4.7 g/dL (ref 3.5–5.0)
Alkaline Phosphatase: 108 U/L (ref 38–126)
Anion gap: 11 (ref 5–15)
BUN: 12 mg/dL (ref 8–23)
CO2: 29 mmol/L (ref 22–32)
Calcium: 9.7 mg/dL (ref 8.9–10.3)
Chloride: 103 mmol/L (ref 98–111)
Creatinine: 0.63 mg/dL (ref 0.44–1.00)
GFR, Estimated: >60 mL/min (ref >60)
Glucose, Bld: 178 mg/dL — ABNORMAL HIGH (ref 70–99)
Potassium: 4.4 mmol/L (ref 3.5–5.1)
Sodium: 143 mmol/L (ref 135–145)
Total Bilirubin: 0.8 mg/dL (ref 0.0–1.2)
Total Protein: 7 g/dL (ref 6.5–8.1)

## 2024-09-13 LAB — FERRITIN: Ferritin: 57 ng/mL (ref 11–307)

## 2024-09-13 NOTE — Patient Instructions (Signed)

## 2024-09-13 NOTE — Progress Notes (Signed)
 [name] presents today for phlebotomy per MD orders. Phlebotomy procedure started at 1045 and ended at 1049. 510 grams removed. Patient observed for 30 minutes after procedure without any incident. Patient tolerated procedure well. 16g IV needle removed intact.

## 2024-09-15 ENCOUNTER — Inpatient Hospital Stay

## 2024-09-19 ENCOUNTER — Ambulatory Visit: Admitting: Neurology

## 2024-09-20 ENCOUNTER — Inpatient Hospital Stay: Attending: Hematology and Oncology | Admitting: Hematology and Oncology

## 2024-09-20 NOTE — Assessment & Plan Note (Signed)
 Elevated liver function tests with elevated ferritin 08/2017 Ferritin : 918.9, iron saturation 43% 01/17/2018 ferritin 124, iron saturation 22% 04/05/2019: Ferritin 180, iron saturation 38% 01/23/2021: Ferritin 105, iron saturation 33%, AST 102, ALT 126, alkaline phosphatase 165, bilirubin 0.8 04/15/2021: Ferritin 43, iron saturation 13%, hemoglobin 15.3, platelets 118 AST 76, ALT 93, hemoglobin A1c 7.4 12/15/2021: Ferritin 29, iron saturation 10%, hemoglobin 14.3, platelets 92 08/14/2022: Ferritin 110, iron saturation 43%, hemoglobin 15.2, platelets 87, AST 45, ALT 54 02/01/2023: Hemoglobin 14.7, platelets 94, AST 32, ALT 39, iron saturation 34%, ferritin 85 05/03/2023: Hemoglobin 15.2, platelets 84, AST 37, ALT 40, iron saturation 43%, ferritin 131 11/05/2023: Hemoglobin 13.9, platelets 86, AST 32, ALT 35, iron saturation 24%, ferritin 51 05/15/2024: Hb 13.4, Platelets: 78, Iron sat: 52%, Ferritin 96, ALT 47 09/13/24: Hb 14.3, Platelets: 90, Iron Sat: 15%, Ferritin: 57, ALT: 54, AST: 53   Diabetes: Her A1c has improved to 5.2 (from 8.2)  Weight issues: She lost 20 pounds by eating healthy. Laminectomy Oct 2024   Current phlebotomy schedule: Every 8 weeks  F/U in 4 months

## 2024-09-20 NOTE — Progress Notes (Signed)
 HEMATOLOGY-ONCOLOGY TELEPHONE VISIT PROGRESS NOTE  I connected with our patient on 09/20/24 at  9:15 AM EST by telephone and verified that I am speaking with the correct person using two identifiers.  I discussed the limitations, risks, security and privacy concerns of performing an evaluation and management service by telephone and the availability of in person appointments.  I also discussed with the patient that there may be a patient responsible charge related to this service. The patient expressed understanding and agreed to proceed.   History of Present Illness: Telephone follow-up to discuss phlebotomy schedule for hemochromatosis  History of Present Illness [name] is a 73 year old female with iron overload who presents for routine follow-up.  Her recent labs show ferritin improved from the 90s to 57 and iron saturation decreased from 50% to 15%. Platelet count has increased from the 70s to 90. Liver function tests are mildly elevated at 54 and 57, slightly above the upper limit of normal at 45.    REVIEW OF SYSTEMS:   Constitutional: Denies fevers, chills or abnormal weight loss All other systems were reviewed with the patient and are negative. Observations/Objective:     Assessment Plan:  Hereditary hemochromatosis Elevated liver function tests with elevated ferritin 08/2017 Ferritin : 918.9, iron saturation 43% 01/17/2018 ferritin 124, iron saturation 22% 04/05/2019: Ferritin 180, iron saturation 38% 01/23/2021: Ferritin 105, iron saturation 33%, AST 102, ALT 126, alkaline phosphatase 165, bilirubin 0.8 04/15/2021: Ferritin 43, iron saturation 13%, hemoglobin 15.3, platelets 118 AST 76, ALT 93, hemoglobin A1c 7.4 12/15/2021: Ferritin 29, iron saturation 10%, hemoglobin 14.3, platelets 92 08/14/2022: Ferritin 110, iron saturation 43%, hemoglobin 15.2, platelets 87, AST 45, ALT 54 02/01/2023: Hemoglobin 14.7, platelets 94, AST 32, ALT 39, iron saturation 34%, ferritin  85 05/03/2023: Hemoglobin 15.2, platelets 84, AST 37, ALT 40, iron saturation 43%, ferritin 131 11/05/2023: Hemoglobin 13.9, platelets 86, AST 32, ALT 35, iron saturation 24%, ferritin 51 05/15/2024: Hb 13.4, Platelets: 78, Iron sat: 52%, Ferritin 96, ALT 47 09/13/24: Hb 14.3, Platelets: 90, Iron Sat: 15%, Ferritin: 57, ALT: 54, AST: 53   Diabetes: Her A1c has improved to 5.2 (from 8.2)  Weight issues: She lost 20 pounds by eating healthy. Laminectomy Oct 2024   Current phlebotomy schedule: Every 8 weeks  F/U in 6 months       I discussed the assessment and treatment plan with the patient. The patient was provided an opportunity to ask questions and all were answered. The patient agreed with the plan and demonstrated an understanding of the instructions. The patient was advised to call back or seek an in-person evaluation if the symptoms worsen or if the condition fails to improve as anticipated.   I provided 20 minutes of non-face-to-face time during this encounter.  This includes time for charting and coordination of care   Naomi MARLA Chad, MD

## 2024-09-26 ENCOUNTER — Inpatient Hospital Stay
Admission: RE | Admit: 2024-09-26 | Discharge: 2024-09-26 | Disposition: A | Source: Ambulatory Visit | Attending: Acute Care | Admitting: Acute Care

## 2024-09-26 DIAGNOSIS — Z87891 Personal history of nicotine dependence: Secondary | ICD-10-CM

## 2024-09-26 DIAGNOSIS — Z122 Encounter for screening for malignant neoplasm of respiratory organs: Secondary | ICD-10-CM

## 2024-09-26 DIAGNOSIS — N3281 Overactive bladder: Secondary | ICD-10-CM | POA: Diagnosis not present

## 2024-09-29 ENCOUNTER — Telehealth: Payer: Self-pay | Admitting: *Deleted

## 2024-09-29 NOTE — Telephone Encounter (Signed)
 Called [facility] and left a message for Dr Kip regarding findings of paraspinal mass seen on scan and possible need for MRI spine. His office to follow up with pt.  Attempted to contact pt to go over findings but had to leave a VM for pt to return call.

## 2024-10-03 ENCOUNTER — Other Ambulatory Visit: Payer: Self-pay

## 2024-10-03 ENCOUNTER — Telehealth: Payer: Self-pay

## 2024-10-03 ENCOUNTER — Ambulatory Visit: Admitting: Neurology

## 2024-10-03 ENCOUNTER — Encounter: Payer: Self-pay | Admitting: Neurology

## 2024-10-03 ENCOUNTER — Encounter (INDEPENDENT_AMBULATORY_CARE_PROVIDER_SITE_OTHER): Payer: Self-pay

## 2024-10-03 VITALS — BP 123/81 | HR 85 | Ht 61.0 in | Wt 150.0 lb

## 2024-10-03 DIAGNOSIS — Z87891 Personal history of nicotine dependence: Secondary | ICD-10-CM

## 2024-10-03 DIAGNOSIS — R42 Dizziness and giddiness: Secondary | ICD-10-CM | POA: Diagnosis not present

## 2024-10-03 DIAGNOSIS — M542 Cervicalgia: Secondary | ICD-10-CM

## 2024-10-03 DIAGNOSIS — H938X3 Other specified disorders of ear, bilateral: Secondary | ICD-10-CM

## 2024-10-03 DIAGNOSIS — Z122 Encounter for screening for malignant neoplasm of respiratory organs: Secondary | ICD-10-CM

## 2024-10-03 MED ORDER — TIZANIDINE HCL 2 MG PO TABS: 2.0000 mg | ORAL_TABLET | Freq: Every day | ORAL | 2 refills | Status: AC

## 2024-10-03 NOTE — Telephone Encounter (Signed)
 Returned call, no answer, LVM to call office and review recent CT results.

## 2024-10-03 NOTE — Telephone Encounter (Signed)
 See original encounter form 09/26/2024 for updates.

## 2024-10-03 NOTE — Telephone Encounter (Signed)
 See telephone note dated 09/29/24.

## 2024-10-03 NOTE — Progress Notes (Signed)
 [facility] Neurology Division Clinic Note - Initial Visit   Date: 10/03/2024   [name] MRN: [id number] DOB: 1951/03/10   Dear Dr. Kip:  Thank you for your kind referral of [name] for consultation of dizziness. Although her history is well known to you, please allow us  to reiterate it for the purpose of our medical record. The patient was accompanied to the clinic by self.    [name] is a 73 y.o. right-handed female with hereditary hemochromatosis, diabetes mellitus, hypertension, hyperlipidemia, COPD, former smoker, and depression presenting for evaluation of dizziness.   IMPRESSION/PLAN: Assessment & Plan Possible cervicogenic dizziness with neck pain.  Chronic dizziness and neck pain exacerbated by head and neck movements, with nausea and headaches.  Differential includes vestibular issues, but absence of vertigo suggests cervicogenic origin. Previous ENT evaluation unremarkable. MRI shows small old strokes, unlikely to cause current symptoms.   - Start neck PT  - Start tizanidine  2mg  at bedtime   2.  Bilateral carotid artery stenosis with moderate narrowing due to calcification.  - Check US  carotids  - Continue statin therapy  3.  Bilateral ear fullness/popping  - Referral to ENT  Return to clinic in 4 months  ------------------------------------------------------------- History of present illness: Discussed the use of AI scribe software for clinical note transcription with the patient, who gave verbal consent to proceed.  History of Present Illness [name] is a 73 year old female who presents with dizziness and imbalance. She was referred for evaluation of dizziness after an ER visit in October.  Dizziness began after returning from a trip to [place]  in early August. She experienced nausea and a sensation of movement when the plane landed, which subsided. However, she continues to experience episodes of dizziness, particularly when  bending over or looking up, accompanied by headaches lasting about an hour and occasional nausea. The dizziness is described as a feeling of unsteadiness rather than spinning.  Dizziness is triggered by neck or head movements and subsides when she returns to a neutral position. No numbness or tingling is reported, and symptoms occur daily without significant change since onset.  In October, she visited the ER after a severe episode of dizziness while doing laundry, which led her husband to suspect a stroke. MRI brain did not show acute stroke, there was chronic small right basal ganglia infarct and white matter changes, none of which would explain her unsteadiness.  CTA head and neck shows left ICA stenosis of 55%, no intracranial large vessel disease.    She also complains of having sensation of bilateral ear fullness/popping.  She would like to seek ENT opinion.    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 She has a history of a leg fracture in 2014, which affects her balance.   Out-side paper records, electronic medical record, and images have been reviewed where available and summarized as:  MRI brain wo contrast 07/29/2024: 1. No acute intracranial abnormality. 2. Remote infarcts involving the right basal ganglia and bilateral thalami with extension into the brainstem. 3. Periventricular T2 hyperintensities mildly advanced for age.   CTA head and neck 07/29/2024: 1. No acute large vessel occlusion. 2. No hemodynamically significant stenosis or aneurysm in the head or neck vessels. 3. No evidence of ischemia by CT brain perfusion. 4. Approximately 55% stenosis of the left internal carotid artery origin by NASCET criteria.   Lab Results  Component Value Date   HGBA1C 5.2 05/21/2022   Lab Results  Component Value Date   VITAMINB12 951 (H) 05/21/2022   Lab Results  Component Value Date   TSH 2.90  05/21/2022   Past Medical History:  Diagnosis Date   Allergy    Anxiety    OCC   Arthritis    COPD (chronic obstructive pulmonary disease) (HCC)    Diabetes mellitus without complication (HCC)    no meds   GERD (gastroesophageal reflux disease)    OCC   Hereditary hemochromatosis    Hyperlipidemia    Hypertension    Vitamin D deficiency 08/2017    Past Surgical History:  Procedure Laterality Date   ABDOMINAL HYSTERECTOMY  11/87   BACK SURGERY  2025   COLONOSCOPY     10 + years ago   FRACTURE SURGERY     tibia/ Fiblia fx surgery   GANGLION CYST EXCISION  2012   post surgery infection   I & D EXTREMITY Right 07/01/2013   Procedure: IRRIGATION AND DEBRIDEMENT RIGHT LEG;  Surgeon: Jerona LULLA Sage, MD;  Location: [facility] OR;  Service: Orthopedics;  Laterality: Right;   LAPAROSCOPIC HYSTERECTOMY     ORIF FEMUR FRACTURE Right 07/01/2013   Procedure: OPEN REDUCTION INTERNAL FIXATION (ORIF) DISTAL FEMUR FRACTURE;  Surgeon: Jerona LULLA Sage, MD;  Location: [facility] OR;  Service: Orthopedics;  Laterality: Right;     Medications:  Outpatient Encounter Medications as of 10/03/2024  Medication Sig   albuterol  (VENTOLIN  HFA) 108 (90 Base) MCG/ACT inhaler 2 puffs as needed   amLODipine  (NORVASC ) 10 MG tablet TAKE 1 TABLET BY MOUTH DAILY   BREZTRI  AEROSPHERE 160-9-4.8 MCG/ACT AERO inhaler Inhale into the lungs.   cholecalciferol (VITAMIN D) 1000 units tablet Take 5,000 Units by mouth daily.   losartan  (COZAAR ) 100 MG tablet TAKE 1 TABLET BY MOUTH DAILY   pravastatin  (PRAVACHOL ) 40 MG tablet TAKE 1 TABLET BY MOUTH DAILY   sertraline  (ZOLOFT ) 100 MG tablet TAKE ONE TABLET BY MOUTH DAILY   vitamin C (ASCORBIC ACID) 500 MG tablet Take 500 mg by mouth daily.   sulfamethoxazole -trimethoprim  (BACTRIM  DS) 800-160 MG tablet Take 1 tablet by mouth in the morning and at bedtime on the day before, day of and day after procedure. (Patient not taking: Reported on 10/03/2024)   [DISCONTINUED] B Complex Vitamins (B  COMPLEX 1 PO) Take by mouth. (Patient not taking: Reported on 10/03/2024)   [DISCONTINUED] Homeopathic Products (LIVER SUPPORT) SUBL Place under the tongue. (Patient not taking: Reported on 10/03/2024)   [DISCONTINUED] MYRBETRIQ  25 MG TB24 tablet TAKE ONE TABLET BY MOUTH DAILY (Patient not taking: Reported on 10/03/2024)   [DISCONTINUED] pregabalin  (LYRICA ) 25 MG capsule Take 1 capsule (25 mg total) by mouth 2 (two) times daily. (Patient not taking: Reported on 10/03/2024)   No facility-administered encounter medications on file as of 10/03/2024.    Allergies: Allergies[1]  Family History: Family History  Problem Relation Age of Onset   Other Sister        Back problems   Healthy Brother    Cancer Maternal Grandmother    Heart disease Maternal Grandfather    Breast cancer Neg Hx    Colon polyps Neg Hx    Colon cancer Neg Hx    Esophageal cancer Neg Hx    Rectal cancer Neg Hx    Stomach cancer Neg Hx     Social History: Social History[2] Social History   Social History Narrative   Are you right handed or  left handed? Right Handed   Are you currently employed ? Kind of. 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    Do you live at home alone? No    Who lives with you? Husband and Son    What type of home do you live in: 1 story or 2 story? Lives in two story home        Vital Signs:  BP 123/81   Pulse 85   Ht 5' 1 (1.549 m)   Wt 150 lb (68 kg)   SpO2 95%   BMI 28.34 kg/m    Neurological Exam: MENTAL STATUS including orientation to time, place, person, recent and remote memory, attention span and concentration, language, and fund of knowledge is normal.  Speech is not dysarthric.  CRANIAL NERVES: II:  No visual field defects.     III-IV-VI: Pupils equal round and reactive to light.  Normal conjugate, extra-ocular eye movements in all directions of gaze.  No nystagmus.  No ptosis.   V:  Normal facial sensation.     VII:  Normal facial symmetry and movements.   VIII:  Normal hearing and vestibular function.   IX-X:  Normal palatal movement.   XI:  Normal shoulder shrug and head rotation.   XII:  Normal tongue strength and range of motion, no deviation or fasciculation.  MOTOR:  Motor strength is 5/5 throughout.  No atrophy, fasciculations or abnormal movements.  No pronator drift.   MSRs:                                           Right        Left brachioradialis 2+  2+  biceps 2+  2+  triceps 2+  2+  patellar 2+  2+  ankle jerk 2+  2+  Hoffman no  no  plantar response down  down   SENSORY:  Normal and symmetric perception of light touch, pinprick, vibration, and pin prick.  Romberg's sign absent.   COORDINATION/GAIT: Normal finger-to- nose-finger.  Intact rapid alternating movements bilaterally.  Able to rise from a chair without using arms.  Gait mildly wide-based and stable. Stressed gait intact.  Unable to perform tandem gait.     Thank you for allowing me to participate in patient's care.  If I can answer any additional questions, I would be pleased to do so.    Sincerely,    Sharon Blakley K. Amjad Fikes, DO     [1] No Known Allergies [2]  Social History Tobacco Use   Smoking status: Former    Current packs/day: 1.00    Average packs/day: 1 pack/day for 44.0 years (44.0 ttl pk-yrs)    Types: Cigarettes   Smokeless tobacco: Never  Substance Use Topics   Alcohol use: Yes    Alcohol/week: 2.0 standard drinks of alcohol    Types: 2 Glasses of wine per week    Comment: Drinks wine   Drug use: Never

## 2024-10-03 NOTE — Telephone Encounter (Signed)
 LVM to call office and review recent Lung CT results.

## 2024-10-03 NOTE — Telephone Encounter (Signed)
 Copied from CRM [id number]. Topic: Clinical - Lab/Test Results >> Oct 02, 2024  9:49 AM Corean SAUNDERS wrote: Reason for CRM: Patient is requesting a call back to discuss her CT results with Lauraine Lites or nurse.  Routing to the [employer] nurses

## 2024-10-03 NOTE — Patient Instructions (Signed)
 Ultrasound carotids  Neck physical therapy referral  Start tizanidine  2mg  at bedtime  Referral to ENT

## 2024-10-20 ENCOUNTER — Ambulatory Visit (HOSPITAL_COMMUNITY)
Admission: RE | Admit: 2024-10-20 | Discharge: 2024-10-20 | Disposition: A | Discharge status: Home / Self Care | Source: Ambulatory Visit | Attending: Neurology | Admitting: Neurology

## 2024-10-20 DIAGNOSIS — R42 Dizziness and giddiness: Secondary | ICD-10-CM | POA: Insufficient documentation

## 2024-10-20 DIAGNOSIS — M542 Cervicalgia: Secondary | ICD-10-CM | POA: Insufficient documentation

## 2024-10-20 DIAGNOSIS — H938X3 Other specified disorders of ear, bilateral: Secondary | ICD-10-CM | POA: Insufficient documentation

## 2024-10-23 ENCOUNTER — Ambulatory Visit: Admitting: Neurology

## 2024-10-24 ENCOUNTER — Ambulatory Visit: Attending: Neurology

## 2024-10-24 ENCOUNTER — Other Ambulatory Visit: Payer: Self-pay

## 2024-10-24 DIAGNOSIS — M542 Cervicalgia: Secondary | ICD-10-CM | POA: Insufficient documentation

## 2024-10-24 DIAGNOSIS — H938X3 Other specified disorders of ear, bilateral: Secondary | ICD-10-CM | POA: Insufficient documentation

## 2024-10-24 DIAGNOSIS — R42 Dizziness and giddiness: Secondary | ICD-10-CM | POA: Diagnosis present

## 2024-10-24 DIAGNOSIS — R293 Abnormal posture: Secondary | ICD-10-CM | POA: Insufficient documentation

## 2024-10-24 NOTE — Therapy (Signed)
 " OUTPATIENT PHYSICAL THERAPY CERVICAL EVALUATION   Patient Name: [name] MRN: [id number] DOB:[birth year], 74 y.o., female Today's Date: 10/24/2024  END OF SESSION:  PT End of Session - 10/24/24 1305     Visit Number 1    Number of Visits 8    Date for Recertification  12/22/24    Authorization Type BC[employer]    PT Start Time 1215    PT Stop Time 1300    PT Time Calculation (min) 45 min    Activity Tolerance Patient tolerated treatment well    Behavior During Therapy [facility] for tasks assessed/performed          Past Medical History:  Diagnosis Date   Allergy    Anxiety    OCC   Arthritis    COPD (chronic obstructive pulmonary disease) (HCC)    Diabetes mellitus without complication (HCC)    no meds   GERD (gastroesophageal reflux disease)    OCC   Hereditary hemochromatosis    Hyperlipidemia    Hypertension    Vitamin D deficiency 08/2017   Past Surgical History:  Procedure Laterality Date   ABDOMINAL HYSTERECTOMY  11/87   BACK SURGERY  2025   COLONOSCOPY     10 + years ago   FRACTURE SURGERY     tibia/ Fiblia fx surgery   GANGLION CYST EXCISION  2012   post surgery infection   I & D EXTREMITY Right 07/01/2013   Procedure: IRRIGATION AND DEBRIDEMENT RIGHT LEG;  Surgeon: Jerona LULLA Sage, MD;  Location: [facility] OR;  Service: Orthopedics;  Laterality: Right;   LAPAROSCOPIC HYSTERECTOMY     ORIF FEMUR FRACTURE Right 07/01/2013   Procedure: OPEN REDUCTION INTERNAL FIXATION (ORIF) DISTAL FEMUR FRACTURE;  Surgeon: Jerona LULLA Sage, MD;  Location: [facility] OR;  Service: Orthopedics;  Laterality: Right;   Patient Active Problem List   Diagnosis Date Noted   Changing skin lesion 11/24/2021   Hypertension associated with diabetes (HCC) 05/28/2021   Former smoker 05/28/2021   Dyslipidemia associated with type 2 diabetes mellitus (HCC) 05/28/2021   COPD (chronic obstructive pulmonary disease) (HCC) 05/28/2021   Cataract 05/28/2021   T2DM (type 2 diabetes mellitus) (HCC) 05/28/2021    Depression, major, single episode, complete remission 05/28/2021   Anxiety 05/28/2021   B12 deficiency 05/28/2021   Urge incontinence 05/28/2021   Hereditary hemochromatosis 11/03/2017    PCP: Kip Righter, MD   REFERRING PROVIDER: Patel, Donika K, DO  REFERRING DIAG: R42 (ICD-10-CM) - Dizziness H93.8X3 (ICD-10-CM) - Ear popping, bilateral M54.2 (ICD-10-CM) - Cervicalgia  THERAPY DIAG:  Cervicalgia  Dizziness and giddiness  Abnormal posture  Rationale for Evaluation and Treatment: Rehabilitation  ONSET DATE: 07/2024  SUBJECTIVE:  SUBJECTIVE STATEMENT: [name] is a 74 year old female who presents with dizziness and imbalance. She was referred for evaluation of dizziness after an ER visit in October.   Dizziness began after returning from a trip to [place]  in early August. She experienced nausea and a sensation of movement when the plane landed, which subsided. However, she continues to experience episodes of dizziness, particularly when bending over or looking up, accompanied by headaches lasting about an hour and occasional nausea. The dizziness is described as a feeling of unsteadiness rather than spinning.  Dizziness is triggered by neck or head movements and subsides when she returns to a neutral position. No numbness or tingling is reported, and symptoms occur daily without significant change since onset.   In October, she visited the ER after a severe episode of dizziness while doing laundry, which led her husband to suspect a stroke. MRI brain did not show acute stroke, there was chronic small right basal ganglia infarct and white matter changes, none of which would explain her unsteadiness.  CTA head and neck shows left ICA stenosis of 55%, no intracranial large vessel disease.      She also complains of having sensation of bilateral ear fullness/popping.  She would like to seek ENT opinion.     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 She has a history of a leg fracture in 2014, which affects her balance. Hand dominance: Right  PERTINENT HISTORY:  Possible cervicogenic dizziness with neck pain.  Chronic dizziness and neck pain exacerbated by head and neck movements, with nausea and headaches.  Differential includes vestibular issues, but absence of vertigo suggests cervicogenic origin. Previous ENT evaluation unremarkable. MRI shows small old strokes, unlikely to cause current symptoms.                 - Start neck PT                - Start tizanidine  2mg  at bedtime                 2.  Bilateral carotid artery stenosis with moderate narrowing due to calcification.                - Check US  carotids                - Continue statin therapy   3.  Bilateral ear fullness/popping                - Referral to ENT  PAIN:  Are you having pain? Yes: NPRS scale: 3/10 Pain location: B neck pain Pain description: ache  Aggravating factors: movement  Relieving factors: rest  PRECAUTIONS: Other: potential vertigo  RED FLAGS: None     WEIGHT BEARING RESTRICTIONS: No  FALLS:  Has patient fallen in last 6 months? No   OCCUPATION: retired  PLOF: Independent  PATIENT GOALS: To manage my neck symptoms  NEXT MD VISIT: TBD  OBJECTIVE:  Note: Objective measures were completed at Evaluation unless otherwise noted.  DIAGNOSTIC FINDINGS:  None noted  PATIENT SURVEYS:  NDI:   Minimum Detectable Change (90% confidence): 5 points or 10% points   POSTURE: rounded shoulders and forward head  PALPATION: Tightness and myofascial restrictions in scalene groups  CERVICAL ROM:   Active ROM A/PROM (deg) eval  Flexion 90%  Extension 50%  Right lateral flexion 25%  Left lateral  flexion 10%  Right rotation 90%  Left rotation 90%   (Blank rows = not tested)  UPPER EXTREMITY ROM: WNL  Active ROM Right eval Left eval  Shoulder flexion    Shoulder extension    Shoulder abduction    Shoulder adduction    Shoulder extension    Shoulder internal rotation    Shoulder external rotation    Elbow flexion    Elbow extension    Wrist flexion    Wrist extension    Wrist ulnar deviation    Wrist radial deviation    Wrist pronation    Wrist supination     (Blank rows = not tested)  UPPER EXTREMITY MMT: WNL  MMT Right eval Left eval  Shoulder flexion    Shoulder extension    Shoulder abduction    Shoulder adduction    Shoulder extension    Shoulder internal rotation    Shoulder external rotation    Middle trapezius    Lower trapezius    Elbow flexion    Elbow extension    Wrist flexion    Wrist extension    Wrist ulnar deviation    Wrist radial deviation    Wrist pronation    Wrist supination    Grip strength     (Blank rows = not tested)  CERVICAL SPECIAL TESTS:  Neck flexor muscle endurance test: Negative and Spurling's test: Negative  FUNCTIONAL TESTS:  N/T  TREATMENT:                                                                                                                              [facility] Adult PT Treatment:                                                DATE: 10/24/24  Self Care: Additional minutes spent for educating on updated Therapeutic Home Exercise Program as well as comparing current status to condition at start of symptoms. This included exercises focusing on stretching, strengthening, with focus on eccentric aspects. Long term goals include an improvement in range of motion, strength, endurance as well as avoiding reinjury. Patient's frequency would include in 1-2 times a day, 3-5 times a week for a duration of 6-12 weeks. Proper technique shown and discussed handout in great detail. All questions were discussed and addressed.       PATIENT EDUCATION:  Education details: Discussed eval findings, rehab rationale and POC and patient is in agreement  Person educated: Patient Education method: Explanation and Handouts Education comprehension: verbalized understanding and needs further education  HOME EXERCISE PROGRAM: Access Code: [email or web address] URL: [email or web address] Date: 10/24/2024 Prepared by: Lincoln Kleiner  Exercises - Seated Shoulder Shrugs  - 2-3 x daily - 5 x weekly - 1 sets - 10 reps - Seated Scapular Retraction  - 2-3 x  daily - 5 x weekly - 1 sets - 10 reps - Seated Upper Trapezius Stretch  - 2-3 x daily - 5 x weekly - 2 reps - 30s hold  ASSESSMENT:  CLINICAL IMPRESSION: Patient is a 74 y.o. female who was seen today for physical therapy evaluation and treatment for mild neck pain and loss of mobility with associated issues or dizziness and imbalance.  Patient denies UE weakness or radicular symptoms.  Objective findings include forward head and rounded shoulder posture.  Cervical mobility markedly restricted in B SB due to scalene and myofascial restrictions.  Patient is a good rehab candidate for postural correction and soft tissue stretching.  Consider referral to neuro for BPPV symptoms as well as MFR practitioner as symptoms warrant  OBJECTIVE IMPAIRMENTS: decreased activity tolerance, decreased balance, decreased knowledge of condition, decreased mobility, decreased ROM, hypomobility, increased fascial restrictions, postural dysfunction, and pain.   ACTIVITY LIMITATIONS: carrying, lifting, and sitting  PERSONAL FACTORS: Age, Past/current experiences, Time since onset of injury/illness/exacerbation, and 1 comorbidity: BPPV? are also affecting patient's functional outcome.   REHAB POTENTIAL: Good  CLINICAL DECISION MAKING: Evolving/moderate complexity  EVALUATION COMPLEXITY: Moderate   GOALS: Goals reviewed with patient? No  SHORT TERM GOALS=LONG TERM : Target date: 12/05/2024     Patient to demonstrate independence in HEP  Baseline:  EPEELFYX Goal status: INITIAL  2.  Patient will acknowledge 2/10 pain at least once during episode of care   Baseline: 3/10 Goal status: INITIAL  3.  Patient will score at least 8/50 on ODI to signify clinically meaningful improvement in functional abilities.   Baseline: 13/50 Goal status: INITIAL  4.  Increase AROM cervical SB to 50% Baseline:  Active ROM A/PROM (deg) eval  Flexion 90%  Extension 50%  Right lateral flexion 25%  Left lateral flexion 10%  Right rotation 90%  Left rotation 90%   Goal status: INITIAL     PLAN:  PT FREQUENCY: 1-2x/week  PT DURATION: 6 weeks  PLANNED INTERVENTIONS: 97110-Therapeutic exercises, 97530- Therapeutic activity, 97112- Neuromuscular re-education, 97535- Self Care, [id number]- Manual therapy, and Patient/Family education  PLAN FOR NEXT SESSION: HEP review and update, manual techniques as appropriate, aerobic tasks, ROM and flexibility activities, strengthening and PREs, TPDN, gait and balance training,aquatic therapy, modalities for pain and NMRE      Reyes CHRISTELLA Kohut, PT 10/24/2024, 1:06 PM      "

## 2024-10-25 ENCOUNTER — Ambulatory Visit: Payer: Self-pay | Admitting: Neurology

## 2024-10-31 ENCOUNTER — Ambulatory Visit

## 2024-11-02 ENCOUNTER — Ambulatory Visit

## 2024-11-06 NOTE — Therapy (Unsigned)
 " OUTPATIENT PHYSICAL THERAPY CERVICAL EVALUATION   Patient Name: [name] MRN: [id number] DOB:Dec 07, 1950, 74 y.o., female Today's Date: 11/06/2024  END OF SESSION:    Past Medical History:  Diagnosis Date   Allergy    Anxiety    OCC   Arthritis    COPD (chronic obstructive pulmonary disease) (HCC)    Diabetes mellitus without complication (HCC)    no meds   GERD (gastroesophageal reflux disease)    OCC   Hereditary hemochromatosis    Hyperlipidemia    Hypertension    Vitamin D deficiency 08/2017   Past Surgical History:  Procedure Laterality Date   ABDOMINAL HYSTERECTOMY  11/87   BACK SURGERY  2025   COLONOSCOPY     10 + years ago   FRACTURE SURGERY     tibia/ Fiblia fx surgery   GANGLION CYST EXCISION  2012   post surgery infection   I & D EXTREMITY Right 07/01/2013   Procedure: IRRIGATION AND DEBRIDEMENT RIGHT LEG;  Surgeon: Jerona LULLA Sage, MD;  Location: [facility] OR;  Service: Orthopedics;  Laterality: Right;   LAPAROSCOPIC HYSTERECTOMY     ORIF FEMUR FRACTURE Right 07/01/2013   Procedure: OPEN REDUCTION INTERNAL FIXATION (ORIF) DISTAL FEMUR FRACTURE;  Surgeon: Jerona LULLA Sage, MD;  Location: [facility] OR;  Service: Orthopedics;  Laterality: Right;   Patient Active Problem List   Diagnosis Date Noted   Changing skin lesion 11/24/2021   Hypertension associated with diabetes (HCC) 05/28/2021   Former smoker 05/28/2021   Dyslipidemia associated with type 2 diabetes mellitus (HCC) 05/28/2021   COPD (chronic obstructive pulmonary disease) (HCC) 05/28/2021   Cataract 05/28/2021   T2DM (type 2 diabetes mellitus) (HCC) 05/28/2021   Depression, major, single episode, complete remission 05/28/2021   Anxiety 05/28/2021   B12 deficiency 05/28/2021   Urge incontinence 05/28/2021   Hereditary hemochromatosis 11/03/2017    PCP: Kip Righter, MD   REFERRING PROVIDER: Patel, Donika K, DO  REFERRING DIAG: R42 (ICD-10-CM) - Dizziness H93.8X3 (ICD-10-CM) - Ear popping, bilateral  M54.2 (ICD-10-CM) - Cervicalgia  THERAPY DIAG:  No diagnosis found.  Rationale for Evaluation and Treatment: Rehabilitation  ONSET DATE: 07/2024  SUBJECTIVE:                                                                                                                                                                                                         SUBJECTIVE STATEMENT: [name] is a 74 year old female who presents with dizziness and imbalance. She was referred for evaluation of dizziness after an ER visit  in October.   Dizziness began after returning from a trip to [place]  in early August. She experienced nausea and a sensation of movement when the plane landed, which subsided. However, she continues to experience episodes of dizziness, particularly when bending over or looking up, accompanied by headaches lasting about an hour and occasional nausea. The dizziness is described as a feeling of unsteadiness rather than spinning.  Dizziness is triggered by neck or head movements and subsides when she returns to a neutral position. No numbness or tingling is reported, and symptoms occur daily without significant change since onset.   In October, she visited the ER after a severe episode of dizziness while doing laundry, which led her husband to suspect a stroke. MRI brain did not show acute stroke, there was chronic small right basal ganglia infarct and white matter changes, none of which would explain her unsteadiness.  CTA head and neck shows left ICA stenosis of 55%, no intracranial large vessel disease.     She also complains of having sensation of bilateral ear fullness/popping.  She would like to seek ENT opinion.     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 She has a history of a leg fracture in 2014, which affects her balance. Hand dominance: Right  PERTINENT HISTORY:  Possible cervicogenic  dizziness with neck pain.  Chronic dizziness and neck pain exacerbated by head and neck movements, with nausea and headaches.  Differential includes vestibular issues, but absence of vertigo suggests cervicogenic origin. Previous ENT evaluation unremarkable. MRI shows small old strokes, unlikely to cause current symptoms.                 - Start neck PT                - Start tizanidine  2mg  at bedtime                 2.  Bilateral carotid artery stenosis with moderate narrowing due to calcification.                - Check US  carotids                - Continue statin therapy   3.  Bilateral ear fullness/popping                - Referral to ENT  PAIN:  Are you having pain? Yes: NPRS scale: 3/10 Pain location: B neck pain Pain description: ache  Aggravating factors: movement  Relieving factors: rest  PRECAUTIONS: Other: potential vertigo  RED FLAGS: None     WEIGHT BEARING RESTRICTIONS: No  FALLS:  Has patient fallen in last 6 months? No   OCCUPATION: retired  PLOF: Independent  PATIENT GOALS: To manage my neck symptoms  NEXT MD VISIT: TBD  OBJECTIVE:  Note: Objective measures were completed at Evaluation unless otherwise noted.  DIAGNOSTIC FINDINGS:  None noted  PATIENT SURVEYS:  NDI:   Minimum Detectable Change (90% confidence): 5 points or 10% points   POSTURE: rounded shoulders and forward head  PALPATION: Tightness and myofascial restrictions in scalene groups  CERVICAL ROM:   Active ROM A/PROM (deg) eval  Flexion 90%  Extension 50%  Right lateral flexion 25%  Left lateral flexion 10%  Right rotation 90%  Left rotation 90%   (Blank rows = not tested)  UPPER EXTREMITY ROM: WNL  Active ROM Right eval Left eval  Shoulder flexion    Shoulder extension    Shoulder abduction    Shoulder adduction    Shoulder extension    Shoulder internal rotation    Shoulder external rotation    Elbow flexion    Elbow extension    Wrist flexion    Wrist  extension    Wrist ulnar deviation    Wrist radial deviation    Wrist pronation    Wrist supination     (Blank rows = not tested)  UPPER EXTREMITY MMT: WNL  MMT Right eval Left eval  Shoulder flexion    Shoulder extension    Shoulder abduction    Shoulder adduction    Shoulder extension    Shoulder internal rotation    Shoulder external rotation    Middle trapezius    Lower trapezius    Elbow flexion    Elbow extension    Wrist flexion    Wrist extension    Wrist ulnar deviation    Wrist radial deviation    Wrist pronation    Wrist supination    Grip strength     (Blank rows = not tested)  CERVICAL SPECIAL TESTS:  Neck flexor muscle endurance test: Negative and Spurling's test: Negative  FUNCTIONAL TESTS:  N/T  TREATMENT:                                                                                                                              [facility] Adult PT Treatment:                                                DATE: 10/24/24  Self Care: Additional minutes spent for educating on updated Therapeutic Home Exercise Program as well as comparing current status to condition at start of symptoms. This included exercises focusing on stretching, strengthening, with focus on eccentric aspects. Long term goals include an improvement in range of motion, strength, endurance as well as avoiding reinjury. Patient's frequency would include in 1-2 times a day, 3-5 times a week for a duration of 6-12 weeks. Proper technique shown and discussed handout in great detail. All questions were discussed and addressed.      PATIENT EDUCATION:  Education details: Discussed eval findings, rehab rationale and POC and patient is in agreement  Person educated: Patient Education method: Explanation and Handouts Education comprehension: verbalized understanding and needs further education  HOME EXERCISE PROGRAM: Access Code: [email or web address] URL: [email or web address] Date:  10/24/2024 Prepared by: Assad Harbeson  Exercises - Seated Shoulder Shrugs  - 2-3 x daily - 5 x weekly - 1 sets - 10 reps - Seated Scapular Retraction  - 2-3 x daily - 5 x weekly - 1 sets - 10 reps - Seated Upper Trapezius Stretch  - 2-3 x daily - 5 x weekly - 2 reps -  30s hold  ASSESSMENT:  CLINICAL IMPRESSION: Patient is a 74 y.o. female who was seen today for physical therapy evaluation and treatment for mild neck pain and loss of mobility with associated issues or dizziness and imbalance.  Patient denies UE weakness or radicular symptoms.  Objective findings include forward head and rounded shoulder posture.  Cervical mobility markedly restricted in B SB due to scalene and myofascial restrictions.  Patient is a good rehab candidate for postural correction and soft tissue stretching.  Consider referral to neuro for BPPV symptoms as well as MFR practitioner as symptoms warrant  OBJECTIVE IMPAIRMENTS: decreased activity tolerance, decreased balance, decreased knowledge of condition, decreased mobility, decreased ROM, hypomobility, increased fascial restrictions, postural dysfunction, and pain.   ACTIVITY LIMITATIONS: carrying, lifting, and sitting  PERSONAL FACTORS: Age, Past/current experiences, Time since onset of injury/illness/exacerbation, and 1 comorbidity: BPPV? are also affecting patient's functional outcome.   REHAB POTENTIAL: Good  CLINICAL DECISION MAKING: Evolving/moderate complexity  EVALUATION COMPLEXITY: Moderate   GOALS: Goals reviewed with patient? No  SHORT TERM GOALS=LONG TERM : Target date: 12/05/2024    Patient to demonstrate independence in HEP  Baseline:  EPEELFYX Goal status: INITIAL  2.  Patient will acknowledge 2/10 pain at least once during episode of care   Baseline: 3/10 Goal status: INITIAL  3.  Patient will score at least 8/50 on ODI to signify clinically meaningful improvement in functional abilities.   Baseline: 13/50 Goal status:  INITIAL  4.  Increase AROM cervical SB to 50% Baseline:  Active ROM A/PROM (deg) eval  Flexion 90%  Extension 50%  Right lateral flexion 25%  Left lateral flexion 10%  Right rotation 90%  Left rotation 90%   Goal status: INITIAL     PLAN:  PT FREQUENCY: 1-2x/week  PT DURATION: 6 weeks  PLANNED INTERVENTIONS: 97110-Therapeutic exercises, 97530- Therapeutic activity, 97112- Neuromuscular re-education, 97535- Self Care, [id number]- Manual therapy, and Patient/Family education  PLAN FOR NEXT SESSION: HEP review and update, manual techniques as appropriate, aerobic tasks, ROM and flexibility activities, strengthening and PREs, TPDN, gait and balance training,aquatic therapy, modalities for pain and NMRE      Reyes CHRISTELLA Kohut, PT 11/06/2024, 9:50 AM      "

## 2024-11-07 ENCOUNTER — Ambulatory Visit

## 2024-11-09 ENCOUNTER — Ambulatory Visit

## 2024-11-14 ENCOUNTER — Ambulatory Visit

## 2024-11-15 ENCOUNTER — Encounter (INDEPENDENT_AMBULATORY_CARE_PROVIDER_SITE_OTHER): Payer: Self-pay | Admitting: Otolaryngology

## 2024-11-15 ENCOUNTER — Ambulatory Visit (INDEPENDENT_AMBULATORY_CARE_PROVIDER_SITE_OTHER): Admitting: Otolaryngology

## 2024-11-15 VITALS — BP 114/75 | HR 82 | Temp 97.4°F | Ht 61.0 in | Wt 150.0 lb

## 2024-11-15 DIAGNOSIS — H6990 Unspecified Eustachian tube disorder, unspecified ear: Secondary | ICD-10-CM | POA: Diagnosis not present

## 2024-11-15 DIAGNOSIS — R42 Dizziness and giddiness: Secondary | ICD-10-CM | POA: Diagnosis not present

## 2024-11-15 DIAGNOSIS — H905 Unspecified sensorineural hearing loss: Secondary | ICD-10-CM | POA: Diagnosis not present

## 2024-11-15 DIAGNOSIS — H903 Sensorineural hearing loss, bilateral: Secondary | ICD-10-CM

## 2024-11-15 DIAGNOSIS — H6993 Unspecified Eustachian tube disorder, bilateral: Secondary | ICD-10-CM

## 2024-11-15 MED ORDER — FLUTICASONE PROPIONATE 50 MCG/ACT NA SUSP: 2.0000 | Freq: Every day | NASAL | 6 refills | Status: AC

## 2024-11-15 NOTE — Progress Notes (Signed)
 Reason for Consult: Dizziness Referring Physician: Dr. Tobie Clarita [name] is an 74 y.o. female.  HPI: History of dizziness and about 6 months ago she had a plane ride where she landed and felt vertigo.  She felt like the plane was still moving.  She did get over this had some nausea with it.  No change in her hearing.  She has not had any tinnitus.  She now is having positional vertigo episodes.  She rises from bed and has it for about a minute.  Sometimes when she looks up she will also get some vertigo.  Occasionally turning her head.  These all lasts for a minute or less.  She has no tinnitus or hearing loss with the episodes.  She does have a clicking cracking popping sound in her ear which is resolved with Valsalva maneuver.  She has a little bit of nose congestion and postnasal drip.  No facial pressure or pain.  No purulent drainage.  She is not having any headaches.  Past Medical History:  Diagnosis Date   Allergy    Anxiety    OCC   Arthritis    COPD (chronic obstructive pulmonary disease) (HCC)    Diabetes mellitus without complication (HCC)    no meds   GERD (gastroesophageal reflux disease)    OCC   Hereditary hemochromatosis    Hyperlipidemia    Hypertension    Vitamin D deficiency 08/2017    Past Surgical History:  Procedure Laterality Date   ABDOMINAL HYSTERECTOMY  11/87   BACK SURGERY  2025   COLONOSCOPY     10 + years ago   FRACTURE SURGERY     tibia/ Fiblia fx surgery   GANGLION CYST EXCISION  2012   post surgery infection   I & D EXTREMITY Right 07/01/2013   Procedure: IRRIGATION AND DEBRIDEMENT RIGHT LEG;  Surgeon: Jerona LULLA Sage, MD;  Location: [facility] OR;  Service: Orthopedics;  Laterality: Right;   LAPAROSCOPIC HYSTERECTOMY     ORIF FEMUR FRACTURE Right 07/01/2013   Procedure: OPEN REDUCTION INTERNAL FIXATION (ORIF) DISTAL FEMUR FRACTURE;  Surgeon: Jerona LULLA Sage, MD;  Location: [facility] OR;  Service: Orthopedics;  Laterality: Right;    Family History  Problem  Relation Age of Onset   Other Sister        Back problems   Healthy Brother    Cancer Maternal Grandmother    Heart disease Maternal Grandfather    Breast cancer Neg Hx    Colon polyps Neg Hx    Colon cancer Neg Hx    Esophageal cancer Neg Hx    Rectal cancer Neg Hx    Stomach cancer Neg Hx     Social History:  reports that she has quit smoking. Her smoking use included cigarettes. She has a 44 pack-year smoking history. She has never used smokeless tobacco. She reports current alcohol use of about 2.0 standard drinks of alcohol per week. She reports that she does not use drugs.  Allergies: Allergies[1]   No results found for this or any previous visit (from the past 48 hours).  No results found.  ROS Blood pressure 114/75, pulse 82, temperature (!) 97.4 F (36.3 C), height 5' 1 (1.549 m), weight 150 lb (68 kg), SpO2 92%. Physical Exam Constitutional:      Appearance: Normal appearance.  HENT:     Head: Normocephalic and atraumatic.     Right Ear: Tympanic membrane is without lesions and middle ear aerated, ear canal and  external ear normal.     Left Ear: Tympanic membrane is without lesions and middle ear aerated, ear canal and external ear normal.     Nose: Nose without deviation of septum.  Turbinates with m check her hearing given her ear like symptoms but if I do a tube for her eustachian tube dysfunction will need this as well.  Eustachian tube dysfunction oderate hypertrophy, No significant swelling or masses.     Oral cavity/oropharynx: Mucous membranes are moist. No lesions or masses    Larynx: normal voice. Mirror attempted without success    Eyes:     Extraocular Movements: Extraocular movements intact.     Conjunctiva/sclera: Conjunctivae normal.     Pupils: Pupils are equal, round, and reactive to light.  Cardiovascular:     Rate and Rhythm: Normal rate.  Pulmonary:     Effort: Pulmonary effort is normal.  Musculoskeletal:     Cervical back: Normal range  of motion and neck supple. No rigidity.  Lymphadenopathy:     Cervical: No cervical adenopathy or masses.salivary glands without lesions. .     Salivary glands- no mass or swelling Neurological:     Mental Status: He is alert. CN 2-12 intact. No nystagmus      Assessment/Plan: Dizziness-this may be benign paroxysmal positional vertigo so I am going to get a Dix-Hallpike and Epley at vestibular rehab.  She will follow-up after that study.  Sensorineural hearing loss/eustachian tube dysfunction-I need to get an audiogram to to evaluate her hearing and because she has eustachian tube dysfunction.  If I need to put a tube in for her eustachian tubes I will need this audiogram.  Eustachian tube dysfunction-she is going to do Valsalva maneuvers and Flonase .  She will try this for a few weeks and if she continues with the pressure and popping and cracking I will discuss with her regarding a tube.  Norleen Notice 11/15/2024, 10:56 AM        [1] No Known Allergies

## 2024-11-16 ENCOUNTER — Ambulatory Visit

## 2024-11-17 ENCOUNTER — Ambulatory Visit (INDEPENDENT_AMBULATORY_CARE_PROVIDER_SITE_OTHER): Admitting: Audiology

## 2024-11-17 ENCOUNTER — Inpatient Hospital Stay

## 2024-11-17 ENCOUNTER — Inpatient Hospital Stay: Attending: Hematology and Oncology

## 2024-11-17 DIAGNOSIS — H903 Sensorineural hearing loss, bilateral: Secondary | ICD-10-CM | POA: Diagnosis not present

## 2024-11-17 LAB — CBC WITH DIFFERENTIAL (CANCER CENTER ONLY)
Abs Immature Granulocytes: 0.01 10*3/uL (ref 0.00–0.07)
Basophils Absolute: 0 10*3/uL (ref 0.0–0.1)
Basophils Relative: 1 %
Eosinophils Absolute: 0.1 10*3/uL (ref 0.0–0.5)
Eosinophils Relative: 3 %
HCT: 40.2 % (ref 36.0–46.0)
Hemoglobin: 14.3 g/dL (ref 12.0–15.0)
Immature Granulocytes: 0 %
Lymphocytes Relative: 20 %
Lymphs Abs: 0.9 10*3/uL (ref 0.7–4.0)
MCH: 31.2 pg (ref 26.0–34.0)
MCHC: 35.6 g/dL (ref 30.0–36.0)
MCV: 87.6 fL (ref 80.0–100.0)
Monocytes Absolute: 0.3 10*3/uL (ref 0.1–1.0)
Monocytes Relative: 6 %
Neutro Abs: 3 10*3/uL (ref 1.7–7.7)
Neutrophils Relative %: 70 %
Platelet Count: 89 10*3/uL — ABNORMAL LOW (ref 150–400)
RBC: 4.59 MIL/uL (ref 3.87–5.11)
RDW: 13.2 % (ref 11.5–15.5)
WBC Count: 4.3 10*3/uL (ref 4.0–10.5)
nRBC: 0 % (ref 0.0–0.2)

## 2024-11-17 LAB — CMP (CANCER CENTER ONLY)
ALT: 58 U/L — ABNORMAL HIGH (ref 0–44)
AST: 50 U/L — ABNORMAL HIGH (ref 15–41)
Albumin: 4.8 g/dL (ref 3.5–5.0)
Alkaline Phosphatase: 102 U/L (ref 38–126)
Anion gap: 12 (ref 5–15)
BUN: 15 mg/dL (ref 8–23)
CO2: 28 mmol/L (ref 22–32)
Calcium: 9.6 mg/dL (ref 8.9–10.3)
Chloride: 102 mmol/L (ref 98–111)
Creatinine: 0.58 mg/dL (ref 0.44–1.00)
GFR, Estimated: >60 mL/min
Glucose, Bld: 138 mg/dL — ABNORMAL HIGH (ref 70–99)
Potassium: 4.4 mmol/L (ref 3.5–5.1)
Sodium: 141 mmol/L (ref 135–145)
Total Bilirubin: 0.9 mg/dL (ref 0.0–1.2)
Total Protein: 6.9 g/dL (ref 6.5–8.1)

## 2024-11-17 LAB — IRON AND IRON BINDING CAPACITY (CC-WL,HP ONLY)
Iron: 130 ug/dL (ref 28–170)
Saturation Ratios: 33 % — ABNORMAL HIGH (ref 10.4–31.8)
TIBC: 399 ug/dL (ref 250–450)
UIBC: 269 ug/dL

## 2024-11-17 LAB — FERRITIN: Ferritin: 70 ng/mL (ref 11–307)

## 2024-11-17 NOTE — Progress Notes (Signed)
 [name] presents today for phlebotomy per MD orders. Hgb 14.3.  Phlebotomy procedure started at 1454 and ended at 1458. 519 grams removed via RAC 16G Patient declined 30 minute observation period. Provided snacks/beverage.  Patient tolerated procedure well. IV needle removed intact.

## 2024-11-17 NOTE — Patient Instructions (Signed)

## 2024-11-21 ENCOUNTER — Ambulatory Visit

## 2024-11-23 ENCOUNTER — Ambulatory Visit

## 2024-11-24 ENCOUNTER — Other Ambulatory Visit: Payer: Self-pay

## 2024-11-24 DIAGNOSIS — R0609 Other forms of dyspnea: Secondary | ICD-10-CM

## 2024-11-30 ENCOUNTER — Encounter

## 2024-11-30 ENCOUNTER — Ambulatory Visit

## 2025-01-16 ENCOUNTER — Inpatient Hospital Stay

## 2025-01-16 ENCOUNTER — Inpatient Hospital Stay: Attending: Hematology and Oncology

## 2025-02-12 ENCOUNTER — Ambulatory Visit: Payer: Self-pay | Admitting: Neurology

## 2025-03-14 ENCOUNTER — Inpatient Hospital Stay

## 2025-05-10 ENCOUNTER — Inpatient Hospital Stay

## 2025-05-10 ENCOUNTER — Inpatient Hospital Stay: Admitting: Hematology and Oncology
